# Patient Record
Sex: Male | Born: 1950
Health system: Southern US, Community
[De-identification: ages and names within clinical notes are randomized; demographics above are authoritative.]

## PROBLEM LIST (undated history)

## (undated) DIAGNOSIS — U071 COVID-19: Secondary | ICD-10-CM

## (undated) DIAGNOSIS — I10 Essential (primary) hypertension: Secondary | ICD-10-CM

## (undated) DIAGNOSIS — B019 Varicella without complication: Secondary | ICD-10-CM

## (undated) DIAGNOSIS — G4730 Sleep apnea, unspecified: Secondary | ICD-10-CM

## (undated) DIAGNOSIS — J449 Chronic obstructive pulmonary disease, unspecified: Secondary | ICD-10-CM

## (undated) DIAGNOSIS — K635 Polyp of colon: Secondary | ICD-10-CM

## (undated) DIAGNOSIS — E785 Hyperlipidemia, unspecified: Secondary | ICD-10-CM

## (undated) DIAGNOSIS — T7840XA Allergy, unspecified, initial encounter: Secondary | ICD-10-CM

## (undated) DIAGNOSIS — F4310 Post-traumatic stress disorder, unspecified: Secondary | ICD-10-CM

## (undated) DIAGNOSIS — G629 Polyneuropathy, unspecified: Secondary | ICD-10-CM

## (undated) DIAGNOSIS — E119 Type 2 diabetes mellitus without complications: Secondary | ICD-10-CM

## (undated) HISTORY — PX: TONSILLECTOMY: SUR1361

## (undated) HISTORY — DX: COVID-19: U07.1

## (undated) HISTORY — DX: Polyp of colon: K63.5

## (undated) HISTORY — DX: Varicella without complication: B01.9

## (undated) HISTORY — DX: Allergy, unspecified, initial encounter: T78.40XA

## (undated) HISTORY — DX: Hyperlipidemia, unspecified: E78.5

## (undated) HISTORY — PX: LASIK: SHX215

## (undated) HISTORY — DX: Essential (primary) hypertension: I10

## (undated) HISTORY — DX: Type 2 diabetes mellitus without complications: E11.9

---

## 2015-11-23 LAB — HM COLONOSCOPY

## 2016-04-19 DIAGNOSIS — M5137 Other intervertebral disc degeneration, lumbosacral region: Secondary | ICD-10-CM | POA: Diagnosis not present

## 2016-04-19 DIAGNOSIS — M62838 Other muscle spasm: Secondary | ICD-10-CM | POA: Diagnosis not present

## 2016-04-19 DIAGNOSIS — M542 Cervicalgia: Secondary | ICD-10-CM | POA: Diagnosis not present

## 2016-04-19 DIAGNOSIS — M5030 Other cervical disc degeneration, unspecified cervical region: Secondary | ICD-10-CM | POA: Diagnosis not present

## 2016-04-19 DIAGNOSIS — M5136 Other intervertebral disc degeneration, lumbar region: Secondary | ICD-10-CM | POA: Diagnosis not present

## 2016-04-19 DIAGNOSIS — M9951 Intervertebral disc stenosis of neural canal of cervical region: Secondary | ICD-10-CM | POA: Diagnosis not present

## 2016-04-19 DIAGNOSIS — M47812 Spondylosis without myelopathy or radiculopathy, cervical region: Secondary | ICD-10-CM | POA: Diagnosis not present

## 2016-05-31 DIAGNOSIS — I10 Essential (primary) hypertension: Secondary | ICD-10-CM | POA: Diagnosis not present

## 2016-05-31 DIAGNOSIS — J3089 Other allergic rhinitis: Secondary | ICD-10-CM | POA: Diagnosis not present

## 2016-05-31 DIAGNOSIS — M720 Palmar fascial fibromatosis [Dupuytren]: Secondary | ICD-10-CM | POA: Diagnosis not present

## 2016-05-31 DIAGNOSIS — E1169 Type 2 diabetes mellitus with other specified complication: Secondary | ICD-10-CM | POA: Diagnosis not present

## 2016-05-31 DIAGNOSIS — F5104 Psychophysiologic insomnia: Secondary | ICD-10-CM | POA: Diagnosis not present

## 2016-05-31 DIAGNOSIS — Z6826 Body mass index (BMI) 26.0-26.9, adult: Secondary | ICD-10-CM | POA: Diagnosis not present

## 2016-05-31 DIAGNOSIS — E785 Hyperlipidemia, unspecified: Secondary | ICD-10-CM | POA: Diagnosis not present

## 2016-05-31 DIAGNOSIS — Z86010 Personal history of colonic polyps: Secondary | ICD-10-CM | POA: Diagnosis not present

## 2016-05-31 DIAGNOSIS — E559 Vitamin D deficiency, unspecified: Secondary | ICD-10-CM | POA: Diagnosis not present

## 2016-06-09 DIAGNOSIS — M720 Palmar fascial fibromatosis [Dupuytren]: Secondary | ICD-10-CM | POA: Diagnosis not present

## 2016-06-14 DIAGNOSIS — M5137 Other intervertebral disc degeneration, lumbosacral region: Secondary | ICD-10-CM | POA: Diagnosis not present

## 2016-06-14 DIAGNOSIS — M5136 Other intervertebral disc degeneration, lumbar region: Secondary | ICD-10-CM | POA: Diagnosis not present

## 2016-06-14 DIAGNOSIS — M62838 Other muscle spasm: Secondary | ICD-10-CM | POA: Diagnosis not present

## 2016-06-14 DIAGNOSIS — M542 Cervicalgia: Secondary | ICD-10-CM | POA: Diagnosis not present

## 2016-06-14 DIAGNOSIS — M47812 Spondylosis without myelopathy or radiculopathy, cervical region: Secondary | ICD-10-CM | POA: Diagnosis not present

## 2016-06-14 DIAGNOSIS — M791 Myalgia: Secondary | ICD-10-CM | POA: Diagnosis not present

## 2016-06-14 DIAGNOSIS — M9951 Intervertebral disc stenosis of neural canal of cervical region: Secondary | ICD-10-CM | POA: Diagnosis not present

## 2016-06-14 DIAGNOSIS — M5030 Other cervical disc degeneration, unspecified cervical region: Secondary | ICD-10-CM | POA: Diagnosis not present

## 2016-08-08 DIAGNOSIS — M720 Palmar fascial fibromatosis [Dupuytren]: Secondary | ICD-10-CM | POA: Diagnosis not present

## 2016-08-09 DIAGNOSIS — H2513 Age-related nuclear cataract, bilateral: Secondary | ICD-10-CM | POA: Diagnosis not present

## 2016-08-09 DIAGNOSIS — E119 Type 2 diabetes mellitus without complications: Secondary | ICD-10-CM | POA: Diagnosis not present

## 2016-08-19 DIAGNOSIS — E1169 Type 2 diabetes mellitus with other specified complication: Secondary | ICD-10-CM | POA: Diagnosis not present

## 2016-08-19 DIAGNOSIS — L03032 Cellulitis of left toe: Secondary | ICD-10-CM | POA: Diagnosis not present

## 2016-08-19 DIAGNOSIS — B351 Tinea unguium: Secondary | ICD-10-CM | POA: Diagnosis not present

## 2016-08-29 DIAGNOSIS — S90221A Contusion of right lesser toe(s) with damage to nail, initial encounter: Secondary | ICD-10-CM | POA: Diagnosis not present

## 2016-08-29 DIAGNOSIS — L03032 Cellulitis of left toe: Secondary | ICD-10-CM | POA: Diagnosis not present

## 2016-09-20 DIAGNOSIS — M791 Myalgia: Secondary | ICD-10-CM | POA: Diagnosis not present

## 2016-09-20 DIAGNOSIS — M47812 Spondylosis without myelopathy or radiculopathy, cervical region: Secondary | ICD-10-CM | POA: Diagnosis not present

## 2016-09-20 DIAGNOSIS — M542 Cervicalgia: Secondary | ICD-10-CM | POA: Diagnosis not present

## 2016-09-20 DIAGNOSIS — M5137 Other intervertebral disc degeneration, lumbosacral region: Secondary | ICD-10-CM | POA: Diagnosis not present

## 2016-09-20 DIAGNOSIS — M62838 Other muscle spasm: Secondary | ICD-10-CM | POA: Diagnosis not present

## 2016-09-20 DIAGNOSIS — M5030 Other cervical disc degeneration, unspecified cervical region: Secondary | ICD-10-CM | POA: Diagnosis not present

## 2016-09-20 DIAGNOSIS — M5136 Other intervertebral disc degeneration, lumbar region: Secondary | ICD-10-CM | POA: Diagnosis not present

## 2016-09-20 DIAGNOSIS — M9951 Intervertebral disc stenosis of neural canal of cervical region: Secondary | ICD-10-CM | POA: Diagnosis not present

## 2016-10-12 DIAGNOSIS — I10 Essential (primary) hypertension: Secondary | ICD-10-CM | POA: Diagnosis not present

## 2016-10-12 DIAGNOSIS — Z125 Encounter for screening for malignant neoplasm of prostate: Secondary | ICD-10-CM | POA: Diagnosis not present

## 2016-10-12 DIAGNOSIS — M720 Palmar fascial fibromatosis [Dupuytren]: Secondary | ICD-10-CM | POA: Diagnosis not present

## 2016-10-12 DIAGNOSIS — Z86010 Personal history of colonic polyps: Secondary | ICD-10-CM | POA: Diagnosis not present

## 2016-10-12 DIAGNOSIS — E1169 Type 2 diabetes mellitus with other specified complication: Secondary | ICD-10-CM | POA: Diagnosis not present

## 2016-10-12 DIAGNOSIS — F5104 Psychophysiologic insomnia: Secondary | ICD-10-CM | POA: Diagnosis not present

## 2016-10-12 DIAGNOSIS — E559 Vitamin D deficiency, unspecified: Secondary | ICD-10-CM | POA: Diagnosis not present

## 2016-10-12 DIAGNOSIS — J3089 Other allergic rhinitis: Secondary | ICD-10-CM | POA: Diagnosis not present

## 2016-10-12 DIAGNOSIS — Z6828 Body mass index (BMI) 28.0-28.9, adult: Secondary | ICD-10-CM | POA: Diagnosis not present

## 2016-10-12 DIAGNOSIS — E785 Hyperlipidemia, unspecified: Secondary | ICD-10-CM | POA: Diagnosis not present

## 2016-10-20 DIAGNOSIS — I10 Essential (primary) hypertension: Secondary | ICD-10-CM | POA: Diagnosis not present

## 2016-10-20 DIAGNOSIS — E785 Hyperlipidemia, unspecified: Secondary | ICD-10-CM | POA: Diagnosis not present

## 2016-10-20 DIAGNOSIS — E119 Type 2 diabetes mellitus without complications: Secondary | ICD-10-CM | POA: Diagnosis not present

## 2016-10-20 DIAGNOSIS — D619 Aplastic anemia, unspecified: Secondary | ICD-10-CM | POA: Diagnosis not present

## 2016-10-28 DIAGNOSIS — M5408 Panniculitis affecting regions of neck and back, sacral and sacrococcygeal region: Secondary | ICD-10-CM | POA: Diagnosis not present

## 2016-10-28 DIAGNOSIS — M47816 Spondylosis without myelopathy or radiculopathy, lumbar region: Secondary | ICD-10-CM | POA: Diagnosis not present

## 2016-10-28 DIAGNOSIS — M47817 Spondylosis without myelopathy or radiculopathy, lumbosacral region: Secondary | ICD-10-CM | POA: Diagnosis not present

## 2016-10-28 DIAGNOSIS — M545 Low back pain: Secondary | ICD-10-CM | POA: Diagnosis not present

## 2016-12-20 DIAGNOSIS — M62838 Other muscle spasm: Secondary | ICD-10-CM | POA: Diagnosis not present

## 2016-12-20 DIAGNOSIS — M5137 Other intervertebral disc degeneration, lumbosacral region: Secondary | ICD-10-CM | POA: Diagnosis not present

## 2016-12-20 DIAGNOSIS — M47812 Spondylosis without myelopathy or radiculopathy, cervical region: Secondary | ICD-10-CM | POA: Diagnosis not present

## 2016-12-20 DIAGNOSIS — M5030 Other cervical disc degeneration, unspecified cervical region: Secondary | ICD-10-CM | POA: Diagnosis not present

## 2016-12-20 DIAGNOSIS — M545 Low back pain: Secondary | ICD-10-CM | POA: Diagnosis not present

## 2016-12-20 DIAGNOSIS — M542 Cervicalgia: Secondary | ICD-10-CM | POA: Diagnosis not present

## 2016-12-20 DIAGNOSIS — M9951 Intervertebral disc stenosis of neural canal of cervical region: Secondary | ICD-10-CM | POA: Diagnosis not present

## 2016-12-20 DIAGNOSIS — Z79891 Long term (current) use of opiate analgesic: Secondary | ICD-10-CM | POA: Diagnosis not present

## 2017-02-13 DIAGNOSIS — M720 Palmar fascial fibromatosis [Dupuytren]: Secondary | ICD-10-CM | POA: Diagnosis not present

## 2017-02-13 DIAGNOSIS — I10 Essential (primary) hypertension: Secondary | ICD-10-CM | POA: Diagnosis not present

## 2017-02-13 DIAGNOSIS — E785 Hyperlipidemia, unspecified: Secondary | ICD-10-CM | POA: Diagnosis not present

## 2017-02-13 DIAGNOSIS — F5104 Psychophysiologic insomnia: Secondary | ICD-10-CM | POA: Diagnosis not present

## 2017-02-13 DIAGNOSIS — Z6828 Body mass index (BMI) 28.0-28.9, adult: Secondary | ICD-10-CM | POA: Diagnosis not present

## 2017-02-13 DIAGNOSIS — E559 Vitamin D deficiency, unspecified: Secondary | ICD-10-CM | POA: Diagnosis not present

## 2017-02-13 DIAGNOSIS — L233 Allergic contact dermatitis due to drugs in contact with skin: Secondary | ICD-10-CM | POA: Diagnosis not present

## 2017-02-13 DIAGNOSIS — Z86010 Personal history of colonic polyps: Secondary | ICD-10-CM | POA: Diagnosis not present

## 2017-02-13 DIAGNOSIS — J3089 Other allergic rhinitis: Secondary | ICD-10-CM | POA: Diagnosis not present

## 2017-02-13 DIAGNOSIS — E1169 Type 2 diabetes mellitus with other specified complication: Secondary | ICD-10-CM | POA: Diagnosis not present

## 2017-02-17 DIAGNOSIS — M47816 Spondylosis without myelopathy or radiculopathy, lumbar region: Secondary | ICD-10-CM | POA: Diagnosis not present

## 2017-02-17 DIAGNOSIS — M5408 Panniculitis affecting regions of neck and back, sacral and sacrococcygeal region: Secondary | ICD-10-CM | POA: Diagnosis not present

## 2017-02-17 DIAGNOSIS — M545 Low back pain: Secondary | ICD-10-CM | POA: Diagnosis not present

## 2017-02-17 DIAGNOSIS — M47817 Spondylosis without myelopathy or radiculopathy, lumbosacral region: Secondary | ICD-10-CM | POA: Diagnosis not present

## 2017-04-03 DIAGNOSIS — M5137 Other intervertebral disc degeneration, lumbosacral region: Secondary | ICD-10-CM | POA: Diagnosis not present

## 2017-04-03 DIAGNOSIS — M9951 Intervertebral disc stenosis of neural canal of cervical region: Secondary | ICD-10-CM | POA: Diagnosis not present

## 2017-04-03 DIAGNOSIS — M47812 Spondylosis without myelopathy or radiculopathy, cervical region: Secondary | ICD-10-CM | POA: Diagnosis not present

## 2017-04-03 DIAGNOSIS — M7918 Myalgia, other site: Secondary | ICD-10-CM | POA: Diagnosis not present

## 2017-04-03 DIAGNOSIS — M5136 Other intervertebral disc degeneration, lumbar region: Secondary | ICD-10-CM | POA: Diagnosis not present

## 2017-04-03 DIAGNOSIS — M542 Cervicalgia: Secondary | ICD-10-CM | POA: Diagnosis not present

## 2017-04-03 DIAGNOSIS — M5030 Other cervical disc degeneration, unspecified cervical region: Secondary | ICD-10-CM | POA: Diagnosis not present

## 2017-04-03 DIAGNOSIS — M62838 Other muscle spasm: Secondary | ICD-10-CM | POA: Diagnosis not present

## 2017-04-14 ENCOUNTER — Ambulatory Visit (INDEPENDENT_AMBULATORY_CARE_PROVIDER_SITE_OTHER): Payer: Medicare Other | Admitting: Internal Medicine

## 2017-04-14 VITALS — BP 134/70 | HR 111 | Temp 97.9°F | Ht 69.0 in | Wt 184.4 lb

## 2017-04-14 DIAGNOSIS — D229 Melanocytic nevi, unspecified: Secondary | ICD-10-CM

## 2017-04-14 DIAGNOSIS — E559 Vitamin D deficiency, unspecified: Secondary | ICD-10-CM | POA: Diagnosis not present

## 2017-04-14 DIAGNOSIS — M720 Palmar fascial fibromatosis [Dupuytren]: Secondary | ICD-10-CM

## 2017-04-14 DIAGNOSIS — E785 Hyperlipidemia, unspecified: Secondary | ICD-10-CM | POA: Diagnosis not present

## 2017-04-14 DIAGNOSIS — K635 Polyp of colon: Secondary | ICD-10-CM

## 2017-04-14 DIAGNOSIS — G8929 Other chronic pain: Secondary | ICD-10-CM

## 2017-04-14 DIAGNOSIS — J302 Other seasonal allergic rhinitis: Secondary | ICD-10-CM

## 2017-04-14 DIAGNOSIS — M549 Dorsalgia, unspecified: Secondary | ICD-10-CM | POA: Diagnosis not present

## 2017-04-14 DIAGNOSIS — E119 Type 2 diabetes mellitus without complications: Secondary | ICD-10-CM | POA: Diagnosis not present

## 2017-04-14 DIAGNOSIS — I10 Essential (primary) hypertension: Secondary | ICD-10-CM | POA: Diagnosis not present

## 2017-04-14 MED ORDER — METFORMIN HCL 1000 MG PO TABS: 1000.0000 mg | ORAL_TABLET | Freq: Two times a day (BID) | ORAL | 1 refills | Status: DC

## 2017-04-14 MED ORDER — AMLODIPINE BESYLATE 2.5 MG PO TABS: 2.5000 mg | ORAL_TABLET | Freq: Every day | ORAL | 1 refills | Status: DC

## 2017-04-14 MED ORDER — JANUVIA 100 MG PO TABS: 100.0000 mg | ORAL_TABLET | Freq: Every day | ORAL | 1 refills | Status: DC

## 2017-04-14 MED ORDER — ATORVASTATIN CALCIUM 20 MG PO TABS: 20.0000 mg | ORAL_TABLET | Freq: Every day | ORAL | 1 refills | Status: DC

## 2017-04-14 NOTE — Patient Instructions (Addendum)
I will refer to dermatology Dr. Kellie Moor on [facility]  [place]  [phone]  We will see you back 06/2017 with me      Mole A mole is a colored (pigmented) growth on the skin. Moles are very common. They are usually harmless, but some moles can become cancerous over time. What are the causes? Moles occur when pigmented skin cells grow together in clusters instead of spreading out in the skin as they normally do. The reason why the skin cells grow together in clusters is not known. What are the signs or symptoms? A mole may be:  [place] or black.  Flat or raised.  Smooth or wrinkled.  How is this diagnosed? A mole is diagnosed with a skin exam. If your health care provider thinks a mole may be cancerous, a piece of the mole will be removed for testing. How is this treated? Treatment is not needed unless a mole is cancerous. If a mole is cancerous, it will be removed. If a mole is causing pain or you do not like the way it looks, you may choose to have it removed. Follow these instructions at home:  Every month, look for new moles and check your existing moles for changes. This is important because a change in a mole can mean that the mole has become cancerous. Look for changes in: ? Size. Look for moles that are more than  in (0.64 cm) wide (in diameter). ? Shape. Look for moles that are not round or oval. ? Borders. Look for moles that are not symmetrical. ? Color. Note that it is normal for moles to get darker during pregnancy or when you take birth control pills.  When you are outdoors, wear sunscreen with SPF 30 (sun protection factor 30) or higher. Reapply the sunscreen every 2-3 hours.  If you have a large number of moles, see a skin doctor (dermatologist) at least one time every year. Contact a health care provider if:  The size, shape, borders, or color of your mole change.  Your mole, or the skin near the mole, becomes painful, sore, red, or swollen.  Your  mole: ? Develops more than one color. ? Itches or bleeds. ? Becomes scaly, sheds skin, or oozes fluid. ? Becomes flat or develops raised areas. ? Becomes hard or soft.  You develop a new mole. This information is not intended to replace advice given to you by your health care provider. Make sure you discuss any questions you have with your health care provider. Document Released: 10/19/2000 Document Revised: 07/08/2015 Document Reviewed: 11/14/2014 Elsevier Interactive Patient Education  [employer].

## 2017-04-14 NOTE — Progress Notes (Signed)
Pre visit review using our clinic review tool, if applicable. No additional management support is needed unless otherwise documented below in the visit note. 

## 2017-04-17 ENCOUNTER — Encounter: Payer: Self-pay | Admitting: Internal Medicine

## 2017-04-17 NOTE — Progress Notes (Signed)
Chief Complaint  Patient presents with  . Establish Care   New patient just moved from [facility]. He had to put his dog of 4 years to sleep today  Reviewed records he brought from [facility] today chart update accordingly  HTN controlled on Norvasc 2.5 mg qd  DM 2 controlled on Januvia 100 and Metformin 1000 mg bid  He requests Rx refills all meds       Review of Systems  Constitutional: Negative for weight loss.  HENT: Negative for hearing loss.   Eyes: Negative for blurred vision.  Respiratory: Negative for shortness of breath.   Cardiovascular: Negative for chest pain.  Gastrointestinal: Negative for abdominal pain.  Musculoskeletal: Positive for back pain.  Skin: Negative for rash.  Neurological: Negative for headaches.  Psychiatric/Behavioral: The patient has insomnia.    Past Medical History:  Diagnosis Date  . Allergy   . Chicken pox   . Colon polyps   . Diabetes mellitus without complication ([place])   . Hyperlipidemia   . Hypertension    History reviewed. No pertinent surgical history. Family History  Problem Relation Age of Onset  . Cancer Mother   . Diabetes Mother   . Hypertension Mother    Social History   Socioeconomic History  . Marital status: Married    Spouse name: Not on file  . Number of children: Not on file  . Years of education: Not on file  . Highest education level: Not on file  Social Needs  . Financial resource strain: Not on file  . Food insecurity - worry: Not on file  . Food insecurity - inability: Not on file  . Transportation needs - medical: Not on file  . Transportation needs - non-medical: Not on file  Occupational History  . Not on file  Tobacco Use  . Smoking status: Former Research scientist (life sciences)  . Smokeless tobacco: Never Used  Substance and Sexual Activity  . Alcohol use: No    Frequency: Never  . Drug use: No  . Sexual activity: Yes    Partners: Female  Other Topics Concern  . Not on file  Social History Narrative   Wears selt belt    Safe in relationship    Recently moved from [facility] 2018/19'   Retired Occupational hygienist degree    Current Meds  Medication Sig  . amLODipine (NORVASC) 2.5 MG tablet Take 1 tablet (2.5 mg total) by mouth daily.  . Ascorbic Acid (VITAMIN C PO) Take by mouth.  Marland Kitchen aspirin EC 81 MG tablet Take 81 mg by mouth daily.  Marland Kitchen atorvastatin (LIPITOR) 20 MG tablet Take 1 tablet (20 mg total) by mouth daily at 6 PM.  . Cholecalciferol (VITAMIN D-3) 1000 units CAPS Take by mouth.  . Cinnamon 500 MG TABS Take by mouth.  Marland Kitchen JANUVIA 100 MG tablet Take 1 tablet (100 mg total) by mouth daily.  . metFORMIN (GLUCOPHAGE) 1000 MG tablet Take 1 tablet (1,000 mg total) by mouth 2 (two) times daily with a meal.  . pyridOXINE (VITAMIN B-6) 50 MG tablet Take 50 mg by mouth daily.  Marland Kitchen tiZANidine (ZANAFLEX) 4 MG tablet TK 1 T PO BID PRN  . vitamin B-12 (CYANOCOBALAMIN) 1000 MCG tablet Take 1,000 mcg by mouth daily.  Marland Kitchen VITAMIN E PO Take by mouth.  . [DISCONTINUED] amLODipine (NORVASC) 2.5 MG tablet   . [DISCONTINUED] atorvastatin (LIPITOR) 20 MG tablet   . [DISCONTINUED] JANUVIA 100 MG tablet   . [DISCONTINUED] metFORMIN (GLUCOPHAGE) 1000 MG  tablet   . [DISCONTINUED] promethazine-codeine (PHENERGAN WITH CODEINE) 6.25-10 MG/5ML syrup Take by mouth every 6 (six) hours as needed for cough.   Allergies  Allergen Reactions  . Biofreeze [Menthol (Topical Analgesic)]     Rash    No results found for this or any previous visit (from the past 2160 hour(s)). Objective  Body mass index is 27.23 kg/m. Wt Readings from Last 3 Encounters:  04/14/17 184 lb 6.4 oz (83.6 kg)   Temp Readings from Last 3 Encounters:  04/14/17 97.9 F (36.6 C) (Oral)   BP Readings from Last 3 Encounters:  04/14/17 134/70   Pulse Readings from Last 3 Encounters:  04/14/17 (!) 111   O2 sat room air 98%   Physical Exam  Constitutional: He is oriented to person, place, and time and well-developed, well-nourished, and in no  distress. Vital signs are normal.  HENT:  Head: Normocephalic and atraumatic.  Mouth/Throat: Oropharynx is clear and moist and mucous membranes are normal.  Eyes: Conjunctivae are normal. Pupils are equal, round, and reactive to light.  Cardiovascular: Normal rate, regular rhythm and normal heart sounds.  Pulmonary/Chest: Effort normal and breath sounds normal.  Abdominal: Soft. Bowel sounds are normal. There is no tenderness.  Neurological: He is alert and oriented to person, place, and time. Gait normal. Gait normal.  Skin: Skin is warm and dry.  Multiple nevi   Psychiatric: Mood, memory, affect and judgment normal.  Nursing note and vitals reviewed.   Assessment   1. DM 2 since 2013 A1C 06/04/16 6.6 10/12/16 A1C 6.5 then A1C 7.0 02/13/17  2. HTN/HLD 3. Chronic back pain  4. HM  Plan   1.  Cont metformin 1000 mg bid and januvia 100 mg qd   Reviewed labs 06/04/16 H/H 12.4/35.9 then 12.4/37.0 10/12/16 then 12.2/36.7 02/13/17, UA had 06/04/16 normal, 06/04/16 lipid TC 101, TG 60, HDL 36, LDL 53.0 06/04/16 then 10/12/16 TC 106, TG 118, HDL 38, LDL 44.4 then 02/13/17 TC 105, TG 107, HDL 33, LDL 50.6, 4/48/18 BUN/Cr 32/1.12 GFR 70; 10/12/16 BUN/Cr. 23/1.09 GFR 72 then 17/1.13 GFR 69 02/13/17, TSH 06/04/16 1.33 normal and TSH 2.21 02/13/17, vit D 36 05/31/16  Urine protein had 06/04/16 normal; had urine protein again 10/12/16 normal due and normal again 02/13/17 due 02/13/2018 Last eye exam 07/2016, foot exam 10/2016 per pt   2.  02/2017 losartan 25 mg was stopped per old PCP note and changed to norvasc 2.5 due to recall  BP controlled today  Cont lipitor 20 mg qhs  3. Pending pain clinic referral in [place]  4.  Flu shot 01/2017  Tdap need to do at f/u  pna shot last had 2013 unclear if had pna 23 vs prevnar  He did have zostervax 2013 but never shingrix vaccines  colonoscopy had 11/23/15 moderate to severe diverticulosis 2 polyps no pathology report noted Dr. Rexene Alberts [facility] requested records for  pathology report  PSA had 10/12/16 2.21 prior to that 06/12/15 2.67  Referred to dermatology today Dr. Kellie Moor tbse multiple nevi   Need to get H/O records from [facility]  Of note reviewed labs h/o +ANA titer 1:40 homogeous other w/u negative and gastric parietal cell ab 103.5 (upper limit nl 25) with normal B12   "I spent 30-35 minutes face-to face with patient with greater than 50% of time spent counseling and/or in coordination of care, reviewing old medical records and coordinating care I.e Rx refills, referrals, and grief counseling due to loss of  dog today   Provider: Dr. Olivia Mackie McLean-Scocuzza-Internal Medicine

## 2017-04-18 ENCOUNTER — Encounter: Payer: Self-pay | Admitting: Internal Medicine

## 2017-05-08 DIAGNOSIS — Z79899 Other long term (current) drug therapy: Secondary | ICD-10-CM | POA: Diagnosis not present

## 2017-05-08 DIAGNOSIS — Z5181 Encounter for therapeutic drug level monitoring: Secondary | ICD-10-CM | POA: Diagnosis not present

## 2017-05-08 DIAGNOSIS — G8929 Other chronic pain: Secondary | ICD-10-CM | POA: Diagnosis not present

## 2017-05-08 DIAGNOSIS — M545 Low back pain: Secondary | ICD-10-CM | POA: Diagnosis not present

## 2017-06-14 ENCOUNTER — Ambulatory Visit (INDEPENDENT_AMBULATORY_CARE_PROVIDER_SITE_OTHER): Payer: Medicare Other | Admitting: Internal Medicine

## 2017-06-14 ENCOUNTER — Encounter: Payer: Self-pay | Admitting: Internal Medicine

## 2017-06-14 VITALS — BP 126/70 | HR 90 | Temp 97.7°F | Ht 69.0 in | Wt 184.8 lb

## 2017-06-14 DIAGNOSIS — Z0184 Encounter for antibody response examination: Secondary | ICD-10-CM

## 2017-06-14 DIAGNOSIS — D649 Anemia, unspecified: Secondary | ICD-10-CM | POA: Diagnosis not present

## 2017-06-14 DIAGNOSIS — E119 Type 2 diabetes mellitus without complications: Secondary | ICD-10-CM

## 2017-06-14 DIAGNOSIS — E785 Hyperlipidemia, unspecified: Secondary | ICD-10-CM

## 2017-06-14 DIAGNOSIS — I10 Essential (primary) hypertension: Secondary | ICD-10-CM

## 2017-06-14 DIAGNOSIS — J302 Other seasonal allergic rhinitis: Secondary | ICD-10-CM

## 2017-06-14 LAB — LIPID PANEL
Cholesterol: 108 mg/dL (ref 0–200)
HDL: 35.3 mg/dL — ABNORMAL LOW
LDL Cholesterol: 45 mg/dL (ref 0–99)
NonHDL: 72.84
Total CHOL/HDL Ratio: 3
Triglycerides: 138 mg/dL (ref 0.0–149.0)
VLDL: 27.6 mg/dL (ref 0.0–40.0)

## 2017-06-14 LAB — CBC WITH DIFFERENTIAL/PLATELET
BASOS PCT: 0.4 % (ref 0.0–3.0)
Basophils Absolute: 0 10*3/uL (ref 0.0–0.1)
EOS PCT: 0.8 % (ref 0.0–5.0)
Eosinophils Absolute: 0.1 10*3/uL (ref 0.0–0.7)
HCT: 39.3 % (ref 39.0–52.0)
HEMOGLOBIN: 13.1 g/dL (ref 13.0–17.0)
LYMPHS ABS: 2 10*3/uL (ref 0.7–4.0)
Lymphocytes Relative: 27.7 % (ref 12.0–46.0)
MCHC: 33.4 g/dL (ref 30.0–36.0)
MCV: 89.4 fl (ref 78.0–100.0)
MONOS PCT: 8.6 % (ref 3.0–12.0)
Monocytes Absolute: 0.6 10*3/uL (ref 0.1–1.0)
Neutro Abs: 4.5 10*3/uL (ref 1.4–7.7)
Neutrophils Relative %: 62.5 % (ref 43.0–77.0)
Platelets: 263 10*3/uL (ref 150.0–400.0)
RBC: 4.4 Mil/uL (ref 4.22–5.81)
RDW: 12.9 % (ref 11.5–15.5)
WBC: 7.3 10*3/uL (ref 4.0–10.5)

## 2017-06-14 LAB — COMPREHENSIVE METABOLIC PANEL WITH GFR
ALT: 27 U/L (ref 0–53)
AST: 23 U/L (ref 0–37)
Albumin: 4.3 g/dL (ref 3.5–5.2)
Alkaline Phosphatase: 60 U/L (ref 39–117)
BUN: 26 mg/dL — ABNORMAL HIGH (ref 6–23)
CO2: 22 meq/L (ref 19–32)
Calcium: 9.4 mg/dL (ref 8.4–10.5)
Chloride: 104 meq/L (ref 96–112)
Creatinine, Ser: 1.05 mg/dL (ref 0.40–1.50)
GFR: 75.07 mL/min
Glucose, Bld: 107 mg/dL — ABNORMAL HIGH (ref 70–99)
Potassium: 4.3 meq/L (ref 3.5–5.1)
Sodium: 137 meq/L (ref 135–145)
Total Bilirubin: 1.2 mg/dL (ref 0.2–1.2)
Total Protein: 7.2 g/dL (ref 6.0–8.3)

## 2017-06-14 LAB — HEMOGLOBIN A1C: Hgb A1c MFr Bld: 7.6 % — ABNORMAL HIGH (ref 4.6–6.5)

## 2017-06-14 NOTE — Progress Notes (Signed)
Chief Complaint  Patient presents with  . Follow-up   F/u  1. HTN sl elevated today on norvasc 2.5 mg qd  2. DM 2 cbg 121 this am on metformin 1000 and Januvia 100  3. He reports 3 weeks ago had sore throat h/a saw provider in [facility] given Mucinex and motrin  4. Allergies on zyrtec 10 mg qd   Review of Systems  Constitutional: Negative for weight loss.  HENT: Negative for sore throat.   Eyes: Negative for blurred vision.  Respiratory: Negative for shortness of breath.   Cardiovascular: Negative for chest pain.  Musculoskeletal: Negative for back pain.  Skin: Negative for rash.  Neurological: Negative for headaches.  Endo/Heme/Allergies: Positive for environmental allergies.  Psychiatric/Behavioral: Negative for depression.   Past Medical History:  Diagnosis Date  . Allergy   . Chicken pox   . Colon polyps   . Diabetes mellitus without complication ([place])   . Hyperlipidemia   . Hypertension    Past Surgical History:  Procedure Laterality Date  . TONSILLECTOMY     Family History  Problem Relation Age of Onset  . Cancer Mother        bone, liver, lung former smoker ? lung died 2008/04/27  . Diabetes Mother   . Hypertension Mother    Social History   Socioeconomic History  . Marital status: Married    Spouse name: Not on file  . Number of children: Not on file  . Years of education: Not on file  . Highest education level: Not on file  Occupational History  . Not on file  Social Needs  . Financial resource strain: Not on file  . Food insecurity:    Worry: Not on file    Inability: Not on file  . Transportation needs:    Medical: Not on file    Non-medical: Not on file  Tobacco Use  . Smoking status: Former Research scientist (life sciences)  . Smokeless tobacco: Never Used  Substance and Sexual Activity  . Alcohol use: No    Frequency: Never  . Drug use: No  . Sexual activity: Yes    Partners: Female  Lifestyle  . Physical activity:    Days per week: Not on file    Minutes per session:  Not on file  . Stress: Not on file  Relationships  . Social connections:    Talks on phone: Not on file    Gets together: Not on file    Attends religious service: Not on file    Active member of club or organization: Not on file    Attends meetings of clubs or organizations: Not on file    Relationship status: Not on file  . Intimate partner violence:    Fear of current or ex partner: Not on file    Emotionally abused: Not on file    Physically abused: Not on file    Forced sexual activity: Not on file  Other Topics Concern  . Not on file  Social History Narrative   Wears selt belt    Safe in relationship    Recently moved from [facility] 2018/19 originally from [facility] dad and sister 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    Retired Occupational hygienist degree    Current Meds  Medication Sig  . amLODipine (NORVASC) 2.5 MG tablet Take 1 tablet (2.5 mg total) by mouth daily.  . Ascorbic Acid (VITAMIN C PO) Take by mouth.  Marland Kitchen aspirin EC 81 MG tablet Take 81 mg by  mouth daily.  Marland Kitchen atorvastatin (LIPITOR) 20 MG tablet Take 1 tablet (20 mg total) by mouth daily at 6 PM.  . cetirizine (ZYRTEC) 10 MG tablet Take 10 mg by mouth daily.  . Cholecalciferol (VITAMIN D-3) 1000 units CAPS Take by mouth.  . Cinnamon 500 MG TABS Take by mouth.  Marland Kitchen ibuprofen (ADVIL,MOTRIN) 600 MG tablet Take 600 mg by mouth every 6 (six) hours as needed.  Marland Kitchen JANUVIA 100 MG tablet Take 1 tablet (100 mg total) by mouth daily.  . metFORMIN (GLUCOPHAGE) 1000 MG tablet Take 1 tablet (1,000 mg total) by mouth 2 (two) times daily with a meal.  . pyridOXINE (VITAMIN B-6) 50 MG tablet Take 50 mg by mouth daily.  Marland Kitchen tiZANidine (ZANAFLEX) 4 MG tablet TK 1 T PO QHS PRN  . vitamin B-12 (CYANOCOBALAMIN) 1000 MCG tablet Take 1,000 mcg by mouth daily.  Marland Kitchen VITAMIN E PO Take by mouth.   Allergies  Allergen Reactions  . Biofreeze [Menthol (Topical Analgesic)]     Rash    No results found for this or any previous visit (from the past 2160  hour(s)). Objective  Body mass index is 27.29 kg/m. Wt Readings from Last 3 Encounters:  06/14/17 184 lb 12.8 oz (83.8 kg)  04/14/17 184 lb 6.4 oz (83.6 kg)   Temp Readings from Last 3 Encounters:  06/14/17 97.7 F (36.5 C) (Oral)  04/14/17 97.9 F (36.6 C) (Oral)   BP Readings from Last 3 Encounters:  06/14/17 (!) 146/76  04/14/17 134/70   Pulse Readings from Last 3 Encounters:  06/14/17 90  04/14/17 (!) 111    Physical Exam  Constitutional: He is oriented to person, place, and time. He appears well-developed and well-nourished. He is cooperative.  HENT:  Head: Normocephalic and atraumatic.  Mouth/Throat: Oropharynx is clear and moist and mucous membranes are normal.  Eyes: Pupils are equal, round, and reactive to light. Conjunctivae are normal.  Cardiovascular: Normal rate, regular rhythm and normal heart sounds.  Pulmonary/Chest: Effort normal and breath sounds normal.  Neurological: He is alert and oriented to person, place, and time. Gait normal.  Skin: Skin is warm, dry and intact.  Psychiatric: He has a normal mood and affect. His speech is normal and behavior is normal. Judgment and thought content normal. Cognition and memory are normal.  Nursing note and vitals reviewed.   Assessment   1. HTN sl elevated  2. DM 2 cbg 121 this am fasting  3. Sore throat resolved  4. Allergic rhinitis  5. HM Plan   1 Recheck BP 126/70 improved  Cont norvasc 2.5 mg qd  Labs today CMET, CBC, lipid, A1C  MMR 2. Cont meds  rec healthy diet choices  Referred  eye Dr. Nicholas Lose DM eye exam  3. Monitor  4. Cont zyrtec 10 mg qd  5.  Flu shot 01/2017  Tdap had 05/04/17 pna shot last had 2013 unclear if had pna 23 vs prevnar  He did have zostervax 2013 but never shingrix vaccines checked and base and none in stock  colonoscopy had 11/23/15 moderate to severe diverticulosis 2 polyps no pathology report noted Dr. Rexene Alberts [facility] requested  records for pathology report  -rec repeat in 5 years with h/o polyps  PSA had 10/12/16 2.21 prior to that 06/12/15 2.67  -due PSA 10/12/17  Declines hep B/C testing for now  Referred to dermatology today Dr. Kellie Moor tbse multiple nevi appt pending 08/09/17  See labs 04/14/17 visit   Chronic  back pain saw [facility] [place] did not like will not be going back hold on pain referral for now   Provider: Dr. Olivia Mackie McLean-Scocuzza-Internal Medicine

## 2017-06-14 NOTE — Patient Instructions (Signed)
Follow up in 4 months  Take care  We will let you know about your labs  Referred to [facility] for diabetic eye exam due 07/2017   Diabetes Mellitus and Nutrition When you have diabetes (diabetes mellitus), it is very important to have healthy eating habits because your blood sugar (glucose) levels are greatly affected by what you eat and drink. Eating healthy foods in the appropriate amounts, at about the same times every day, can help you:  Control your blood glucose.  Lower your risk of heart disease.  Improve your blood pressure.  Reach or maintain a healthy weight.  Every person with diabetes is different, and each person has different needs for a meal plan. Your health care provider may recommend that you work with a diet and nutrition specialist (dietitian) to make a meal plan that is best for you. Your meal plan may vary depending on factors such as:  The calories you need.  The medicines you take.  Your weight.  Your blood glucose, blood pressure, and cholesterol levels.  Your activity level.  Other health conditions you have, such as heart or kidney disease.  How do carbohydrates affect me? Carbohydrates affect your blood glucose level more than any other type of food. Eating carbohydrates naturally increases the amount of glucose in your blood. Carbohydrate counting is a method for keeping track of how many carbohydrates you eat. Counting carbohydrates is important to keep your blood glucose at a healthy level, especially if you use insulin or take certain oral diabetes medicines. It is important to know how many carbohydrates you can safely have in each meal. This is different for every person. Your dietitian can help you calculate how many carbohydrates you should have at each meal and for snack. Foods that contain carbohydrates include:  Bread, cereal, rice, pasta, and crackers.  Potatoes and corn.  Peas, beans, and lentils.  Milk and yogurt.  Fruit and  juice.  Desserts, such as cakes, cookies, ice cream, and candy.  How does alcohol affect me? Alcohol can cause a sudden decrease in blood glucose (hypoglycemia), especially if you use insulin or take certain oral diabetes medicines. Hypoglycemia can be a life-threatening condition. Symptoms of hypoglycemia (sleepiness, dizziness, and confusion) are similar to symptoms of having too much alcohol. If your health care provider says that alcohol is safe for you, follow these guidelines:  Limit alcohol intake to no more than 1 drink per day for nonpregnant women and 2 drinks per day for men. One drink equals 12 oz of beer, 5 oz of wine, or 1 oz of hard liquor.  Do not drink on an empty stomach.  Keep yourself hydrated with water, diet soda, or unsweetened iced tea.  Keep in mind that regular soda, juice, and other mixers may contain a lot of sugar and must be counted as carbohydrates.  What are tips for following this plan? Reading food labels  Start by checking the serving size on the label. The amount of calories, carbohydrates, fats, and other nutrients listed on the label are based on one serving of the food. Many foods contain more than one serving per package.  Check the total grams (g) of carbohydrates in one serving. You can calculate the number of servings of carbohydrates in one serving by dividing the total carbohydrates by 15. For example, if a food has 30 g of total carbohydrates, it would be equal to 2 servings of carbohydrates.  Check the number of grams (g) of saturated and  trans fats in one serving. Choose foods that have low or no amount of these fats.  Check the number of milligrams (mg) of sodium in one serving. Most people should limit total sodium intake to less than 2,300 mg per day.  Always check the nutrition information of foods labeled as "low-fat" or "nonfat". These foods may be higher in added sugar or refined carbohydrates and should be avoided.  Talk to your  dietitian to identify your daily goals for nutrients listed on the label. Shopping  Avoid buying canned, premade, or processed foods. These foods tend to be high in fat, sodium, and added sugar.  Shop around the outside edge of the grocery store. This includes fresh fruits and vegetables, bulk grains, fresh meats, and fresh dairy. Cooking  Use low-heat cooking methods, such as baking, instead of high-heat cooking methods like deep frying.  Cook using healthy oils, such as olive, canola, or sunflower oil.  Avoid cooking with butter, cream, or high-fat meats. Meal planning  Eat meals and snacks regularly, preferably at the same times every day. Avoid going long periods of time without eating.  Eat foods high in fiber, such as fresh fruits, vegetables, beans, and whole grains. Talk to your dietitian about how many servings of carbohydrates you can eat at each meal.  Eat 4-6 ounces of lean protein each day, such as lean meat, chicken, fish, eggs, or tofu. 1 ounce is equal to 1 ounce of meat, chicken, or fish, 1 egg, or 1/4 cup of tofu.  Eat some foods each day that contain healthy fats, such as avocado, nuts, seeds, and fish. Lifestyle   Check your blood glucose regularly.  Exercise at least 30 minutes 5 or more days each week, or as told by your health care provider.  Take medicines as told by your health care provider.  Do not use any products that contain nicotine or tobacco, such as cigarettes and e-cigarettes. If you need help quitting, ask your health care provider.  Work with a Social worker or diabetes educator to identify strategies to manage stress and any emotional and social challenges. What are some questions to ask my health care provider?  Do I need to meet with a diabetes educator?  Do I need to meet with a dietitian?  What number can I call if I have questions?  When are the best times to check my blood glucose? Where to find more information:  American Diabetes  Association: diabetes.org/food-and-fitness/food  Academy of Nutrition and Dietetics: PokerClues.dk  [employer] of Diabetes and Digestive and Kidney Diseases (NIH): ContactWire.be Summary  A healthy meal plan will help you control your blood glucose and maintain a healthy lifestyle.  Working with a diet and nutrition specialist (dietitian) can help you make a meal plan that is best for you.  Keep in mind that carbohydrates and alcohol have immediate effects on your blood glucose levels. It is important to count carbohydrates and to use alcohol carefully. This information is not intended to replace advice given to you by your health care provider. Make sure you discuss any questions you have with your health care provider. Document Released: 10/21/2004 Document Revised: 02/29/2016 Document Reviewed: 02/29/2016 Elsevier Interactive Patient Education  [employer].

## 2017-06-14 NOTE — Progress Notes (Signed)
Pre visit review using our clinic review tool, if applicable. No additional management support is needed unless otherwise documented below in the visit note. 

## 2017-06-15 LAB — MEASLES/MUMPS/RUBELLA IMMUNITY
Mumps IgG: 23.8 AU/mL
Rubella: 8.23 index
Rubeola IgG: 252 AU/mL

## 2017-06-16 ENCOUNTER — Telehealth: Payer: Self-pay | Admitting: Internal Medicine

## 2017-06-16 NOTE — Telephone Encounter (Signed)
Called in and was given lab results ordered by Dr.  Terese Door ordered on 06/14/17.  MMR blood levels good.  BUN elevated.  Please avoid NSAIDS:  Mobic/meloxidam, Advil, Ibuprofen, etc Recommend increase water intake.  Cholesterol numbers look normal except HDL, good cholesterol slightly low.   A1c 7.6 goal <7.0  Please try healthier diet choices and exercise to get back down. Blood counts normal.

## 2017-06-16 NOTE — Telephone Encounter (Signed)
Patient notified and lab has been resulted.

## 2017-06-16 NOTE — Telephone Encounter (Signed)
Copied from [place] [id number]. Topic: Quick Communication - Lab Results >> Jun 16, 2017  9:12 AM Carolyn Stare wrote:  Pt returning call fo lab results   [phone]

## 2017-07-11 ENCOUNTER — Encounter: Payer: Self-pay | Admitting: Internal Medicine

## 2017-07-14 MED ORDER — GLUCOSE BLOOD VI STRP: ORAL_STRIP | 12 refills | Status: DC

## 2017-07-24 ENCOUNTER — Encounter: Payer: Self-pay | Admitting: Internal Medicine

## 2017-07-27 ENCOUNTER — Encounter: Payer: Self-pay | Admitting: *Deleted

## 2017-07-27 ENCOUNTER — Other Ambulatory Visit: Payer: Self-pay | Admitting: Internal Medicine

## 2017-07-27 DIAGNOSIS — R194 Change in bowel habit: Secondary | ICD-10-CM

## 2017-07-27 DIAGNOSIS — K219 Gastro-esophageal reflux disease without esophagitis: Secondary | ICD-10-CM

## 2017-08-09 DIAGNOSIS — D2271 Melanocytic nevi of right lower limb, including hip: Secondary | ICD-10-CM | POA: Diagnosis not present

## 2017-08-09 DIAGNOSIS — D225 Melanocytic nevi of trunk: Secondary | ICD-10-CM | POA: Diagnosis not present

## 2017-08-09 DIAGNOSIS — D2262 Melanocytic nevi of left upper limb, including shoulder: Secondary | ICD-10-CM | POA: Diagnosis not present

## 2017-08-09 DIAGNOSIS — D2272 Melanocytic nevi of left lower limb, including hip: Secondary | ICD-10-CM | POA: Diagnosis not present

## 2017-08-09 DIAGNOSIS — D2261 Melanocytic nevi of right upper limb, including shoulder: Secondary | ICD-10-CM | POA: Diagnosis not present

## 2017-08-09 DIAGNOSIS — L821 Other seborrheic keratosis: Secondary | ICD-10-CM | POA: Diagnosis not present

## 2017-09-05 ENCOUNTER — Encounter: Payer: Self-pay | Admitting: Gastroenterology

## 2017-09-05 ENCOUNTER — Ambulatory Visit (INDEPENDENT_AMBULATORY_CARE_PROVIDER_SITE_OTHER): Payer: Medicare Other | Admitting: Gastroenterology

## 2017-09-05 VITALS — BP 135/77 | HR 96 | Ht 69.0 in | Wt 183.0 lb

## 2017-09-05 DIAGNOSIS — R197 Diarrhea, unspecified: Secondary | ICD-10-CM | POA: Diagnosis not present

## 2017-09-05 DIAGNOSIS — R438 Other disturbances of smell and taste: Secondary | ICD-10-CM

## 2017-09-05 NOTE — Progress Notes (Signed)
Andrew Gonzalez [address]  [place]  [place], Andrew Gonzalez  Main: [phone]  Fax: [phone]   Gastroenterology Consultation  Referring Provider:     McLean-Scocuzza, Andrew Gonzalez * Primary Care Physician:  Andrew Gonzalez, Andrew Glow, Andrew Gonzalez Primary Gastroenterologist:  Dr. Vonda Gonzalez Reason for Consultation:     Altered bowel habits, GERD        HPI:   Chief complaint: Diarrhea, bad taste in mouth  Andrew Gonzalez is a 67 y.o. y/o male referred for consultation & management  by Dr. Terese Door, Andrew Glow, Andrew Gonzalez.  Patient reports in April 2019 he started having 3-4 loose bowel movements a day, and his grandson was diagnosed with a stool infection at the time.  Other family members also started having diarrhea at the time.  Symptoms resolved after a few weeks, but reoccurred twice since then.  Also had bad taste in mouth intermittently 3-4 times a week during these episodes.  No vomiting or nausea.  He identified the diarrhea to be associated with red meat or lettuce.  He stopped eating these, and symptoms have completely resolved and not recurred in the last 5 weeks.  No weight loss.  No blood in stool.  No dysphagia.  Not on any antireflux or antidiarrheal medications.  No new medications.  No family history of colon cancer. Had a colonoscopy in 2017.  Primary care provider note states polyps were removed but they do not have the pathology report.  He also reports having a colonoscopy 5 to 6 years prior to that and polyps were also removed at that time. No previous EGDs.  Past Medical History:  Diagnosis Date  . Allergy   . Chicken pox   . Colon polyps   . Diabetes mellitus without complication ([place])   . Hyperlipidemia   . Hypertension     Past Surgical History:  Procedure Laterality Date  . TONSILLECTOMY      Prior to Admission medications   Medication Sig Start Date End Date Taking? Authorizing Provider  amLODipine (NORVASC) 2.5 MG tablet Take 1 tablet (2.5  mg total) by mouth daily. 04/14/17   Andrew Gonzalez, Andrew Glow, Andrew Gonzalez  Ascorbic Acid (VITAMIN C PO) Take by mouth.    Provider, Historical, Andrew Gonzalez  aspirin EC 81 MG tablet Take 81 mg by mouth daily.    Provider, Historical, Andrew Gonzalez  atorvastatin (LIPITOR) 20 MG tablet Take 1 tablet (20 mg total) by mouth daily at 6 PM. 04/14/17   Andrew Gonzalez, Andrew Glow, Andrew Gonzalez  cetirizine (ZYRTEC) 10 MG tablet Take 10 mg by mouth daily.    Provider, Historical, Andrew Gonzalez  Cholecalciferol (VITAMIN D-3) 1000 units CAPS Take by mouth.    Provider, Historical, Andrew Gonzalez  Cinnamon 500 MG TABS Take by mouth.    Provider, Historical, Andrew Gonzalez  glucose blood (FREESTYLE LITE) test strip Use as instructed 07/14/17   Andrew Gonzalez, Andrew Glow, Andrew Gonzalez  ibuprofen (ADVIL,MOTRIN) 600 MG tablet Take 600 mg by mouth every 6 (six) hours as needed.    Provider, Historical, Andrew Gonzalez  JANUVIA 100 MG tablet Take 1 tablet (100 mg total) by mouth daily. 04/14/17   Andrew Gonzalez, Andrew Glow, Andrew Gonzalez  metFORMIN (GLUCOPHAGE) 1000 MG tablet Take 1 tablet (1,000 mg total) by mouth 2 (two) times daily with a meal. 04/14/17   Andrew Gonzalez, Andrew Glow, Andrew Gonzalez  pyridOXINE (VITAMIN B-6) 50 MG tablet Take 50 mg by mouth daily.    Provider, Historical, Andrew Gonzalez  tiZANidine (ZANAFLEX) 4 MG tablet TK 1 T PO QHS PRN 04/04/17   Provider,  Historical, Andrew Gonzalez  vitamin B-12 (CYANOCOBALAMIN) 1000 MCG tablet Take 1,000 mcg by mouth daily.    Provider, Historical, Andrew Gonzalez  VITAMIN E PO Take by mouth.    Provider, Historical, Andrew Gonzalez    Family History  Problem Relation Age of Onset  . Cancer Mother        bone, liver, lung former smoker ? lung died 05-09-08  . Diabetes Mother   . Hypertension Mother      Social History   Tobacco Use  . Smoking status: Former Research scientist (life sciences)  . Smokeless tobacco: Never Used  Substance Use Topics  . Alcohol use: No    Frequency: Never  . Drug use: No    Allergies as of 09/05/2017 - Review Complete 06/14/2017  Allergen Reaction Noted  . Biofreeze [menthol (topical analgesic)]  04/17/2017    Review of  Systems:    All systems reviewed and negative except where noted in HPI.   Physical Exam:  There were no vitals taken for this visit. No LMP for male patient. Psych:  Alert and cooperative. Normal mood and affect. General:   Alert,  Well-developed, well-nourished, pleasant and cooperative in NAD Head:  Normocephalic and atraumatic. Eyes:  Sclera clear, no icterus.   Conjunctiva pink. Ears:  Normal auditory acuity. Nose:  No deformity, discharge, or lesions. Mouth:  No deformity or lesions,oropharynx pink & moist. Neck:  Supple; no masses or thyromegaly. Lungs:  Respirations even and unlabored.  Clear throughout to auscultation.   No wheezes, crackles, or rhonchi. No acute distress. Heart:  Regular rate and rhythm; no murmurs, clicks, rubs, or gallops. Abdomen:  Normal bowel sounds.  No bruits.  Soft, non-tender and non-distended without masses, hepatosplenomegaly or hernias noted.  No guarding or rebound tenderness.    Msk:  Symmetrical without gross deformities. Good, equal movement & strength bilaterally. Pulses:  Normal pulses noted. Extremities:  No clubbing or edema.  No cyanosis. Neurologic:  Alert and oriented x3;  grossly normal neurologically. Skin:  Intact without significant lesions or rashes. No jaundice. Lymph Nodes:  No significant cervical adenopathy. Psych:  Alert and cooperative. Normal mood and affect.   Labs: CBC    Component Value Date/Time   WBC 7.3 06/14/2017 0934   RBC 4.40 06/14/2017 0934   HGB 13.1 06/14/2017 0934   HCT 39.3 06/14/2017 0934   PLT 263.0 06/14/2017 0934   MCV 89.4 06/14/2017 0934   MCHC 33.4 06/14/2017 0934   RDW 12.9 06/14/2017 0934   LYMPHSABS 2.0 06/14/2017 0934   MONOABS 0.6 06/14/2017 0934   EOSABS 0.1 06/14/2017 0934   BASOSABS 0.0 06/14/2017 0934   CMP     Component Value Date/Time   NA 137 06/14/2017 0934   K 4.3 06/14/2017 0934   CL 104 06/14/2017 0934   CO2 22 06/14/2017 0934   GLUCOSE 107 (H) 06/14/2017 0934   BUN  26 (H) 06/14/2017 0934   CREATININE 1.05 06/14/2017 0934   CALCIUM 9.4 06/14/2017 0934   PROT 7.2 06/14/2017 0934   ALBUMIN 4.3 06/14/2017 0934   AST 23 06/14/2017 0934   ALT 27 06/14/2017 0934   ALKPHOS 60 06/14/2017 0934   BILITOT 1.2 06/14/2017 0934    Imaging Studies: No results found.  Assessment and Plan:   Pilot Prindle is a 67 y.o. y/o male has been referred for diarrhea that started in April 2019 at the time of his grandson being diagnosed with stool infection, and other family members also having diarrhea at the same time, with no symptoms  over the last 5 weeks whatsoever  Patient likely had infectious diarrhea This is completely resolved at this time He might have also been postinfectious syndrome, that led to the 2 subsequent episodes of diarrhea after the initial episode resolved He identified red meat and lettuce his triggers to his diarrhea and has avoided this in the last 5 weeks He can start reintroducing the slowly after months to see if symptoms recur Since symptoms have resolved, no indication for further testing at this time  If diarrhea reoccurs, I have asked him to call us, as we can obtain stool testing at the time No previous stool test I have educated him about risk factors for diarrhea, including antibiotic use, medications, products with sugars etc.  No dysphagia Bad taste in mouth that occurred at the time of diarrhea is currently resolved.  Symptoms were likely related to gastroenteritis at the time No indication for EGD  We do not have his previous colonoscopy report, primary care provider to review his previous colonoscopy or records including pathology report to determine timing for next colonoscopy.  Last one was in 2017  Dr Andrew Gonzalez

## 2017-09-28 DIAGNOSIS — E1140 Type 2 diabetes mellitus with diabetic neuropathy, unspecified: Secondary | ICD-10-CM | POA: Diagnosis not present

## 2017-09-28 DIAGNOSIS — B351 Tinea unguium: Secondary | ICD-10-CM | POA: Diagnosis not present

## 2017-10-04 ENCOUNTER — Encounter: Payer: Self-pay | Admitting: Internal Medicine

## 2017-10-04 DIAGNOSIS — G8929 Other chronic pain: Secondary | ICD-10-CM | POA: Diagnosis not present

## 2017-10-04 DIAGNOSIS — E119 Type 2 diabetes mellitus without complications: Secondary | ICD-10-CM | POA: Diagnosis not present

## 2017-10-04 DIAGNOSIS — Z79891 Long term (current) use of opiate analgesic: Secondary | ICD-10-CM | POA: Diagnosis not present

## 2017-10-04 DIAGNOSIS — M5136 Other intervertebral disc degeneration, lumbar region: Secondary | ICD-10-CM | POA: Diagnosis not present

## 2017-10-04 DIAGNOSIS — M4316 Spondylolisthesis, lumbar region: Secondary | ICD-10-CM | POA: Diagnosis not present

## 2017-10-04 LAB — HM DIABETES EYE EXAM

## 2017-10-16 ENCOUNTER — Encounter: Payer: Self-pay | Admitting: Internal Medicine

## 2017-10-16 ENCOUNTER — Other Ambulatory Visit: Payer: Self-pay | Admitting: Internal Medicine

## 2017-10-16 ENCOUNTER — Telehealth: Payer: Self-pay | Admitting: Radiology

## 2017-10-16 ENCOUNTER — Ambulatory Visit (INDEPENDENT_AMBULATORY_CARE_PROVIDER_SITE_OTHER): Payer: Medicare Other | Admitting: Internal Medicine

## 2017-10-16 VITALS — BP 132/70 | HR 87 | Temp 97.4°F | Ht 69.0 in | Wt 179.6 lb

## 2017-10-16 DIAGNOSIS — Z23 Encounter for immunization: Secondary | ICD-10-CM

## 2017-10-16 DIAGNOSIS — E559 Vitamin D deficiency, unspecified: Secondary | ICD-10-CM

## 2017-10-16 DIAGNOSIS — Z125 Encounter for screening for malignant neoplasm of prostate: Secondary | ICD-10-CM | POA: Diagnosis not present

## 2017-10-16 DIAGNOSIS — J309 Allergic rhinitis, unspecified: Secondary | ICD-10-CM

## 2017-10-16 DIAGNOSIS — Z1329 Encounter for screening for other suspected endocrine disorder: Secondary | ICD-10-CM

## 2017-10-16 DIAGNOSIS — E119 Type 2 diabetes mellitus without complications: Secondary | ICD-10-CM | POA: Diagnosis not present

## 2017-10-16 DIAGNOSIS — I10 Essential (primary) hypertension: Secondary | ICD-10-CM

## 2017-10-16 DIAGNOSIS — M549 Dorsalgia, unspecified: Secondary | ICD-10-CM | POA: Diagnosis not present

## 2017-10-16 DIAGNOSIS — G8929 Other chronic pain: Secondary | ICD-10-CM | POA: Diagnosis not present

## 2017-10-16 LAB — CBC WITH DIFFERENTIAL/PLATELET
BASOS ABS: 0.1 10*3/uL (ref 0.0–0.1)
Basophils Relative: 0.9 % (ref 0.0–3.0)
EOS ABS: 0.1 10*3/uL (ref 0.0–0.7)
Eosinophils Relative: 1.1 % (ref 0.0–5.0)
HEMATOCRIT: 38.1 % — AB (ref 39.0–52.0)
HEMOGLOBIN: 13.2 g/dL (ref 13.0–17.0)
LYMPHS PCT: 26.4 % (ref 12.0–46.0)
Lymphs Abs: 2.2 10*3/uL (ref 0.7–4.0)
MCHC: 34.5 g/dL (ref 30.0–36.0)
MCV: 87.5 fl (ref 78.0–100.0)
MONO ABS: 0.7 10*3/uL (ref 0.1–1.0)
Monocytes Relative: 8.3 % (ref 3.0–12.0)
Neutro Abs: 5.2 10*3/uL (ref 1.4–7.7)
Neutrophils Relative %: 63.3 % (ref 43.0–77.0)
Platelets: 266 10*3/uL (ref 150.0–400.0)
RBC: 4.36 Mil/uL (ref 4.22–5.81)
RDW: 13.3 % (ref 11.5–15.5)
WBC: 8.2 10*3/uL (ref 4.0–10.5)

## 2017-10-16 LAB — HEMOGLOBIN A1C: HEMOGLOBIN A1C: 7.2 % — AB (ref 4.6–6.5)

## 2017-10-16 LAB — VITAMIN D 25 HYDROXY (VIT D DEFICIENCY, FRACTURES): VITD: 27.75 ng/mL — AB (ref 30.00–100.00)

## 2017-10-16 LAB — PSA, MEDICARE: PSA: 2.15 ng/mL (ref 0.10–4.00)

## 2017-10-16 LAB — TSH: TSH: 1.74 u[IU]/mL (ref 0.35–4.50)

## 2017-10-16 MED ORDER — GLUCOSE BLOOD VI STRP: ORAL_STRIP | 12 refills | Status: DC

## 2017-10-16 MED ORDER — ASPIRIN EC 81 MG PO TBEC: 81.0000 mg | DELAYED_RELEASE_TABLET | Freq: Every day | ORAL | 3 refills | Status: DC

## 2017-10-16 MED ORDER — METFORMIN HCL 1000 MG PO TABS: 1000.0000 mg | ORAL_TABLET | Freq: Two times a day (BID) | ORAL | 3 refills | Status: DC

## 2017-10-16 MED ORDER — SITAGLIPTIN PHOSPHATE 100 MG PO TABS: 100.0000 mg | ORAL_TABLET | Freq: Every day | ORAL | 3 refills | Status: DC

## 2017-10-16 MED ORDER — ATORVASTATIN CALCIUM 20 MG PO TABS: 20.0000 mg | ORAL_TABLET | Freq: Every day | ORAL | 3 refills | Status: DC

## 2017-10-16 MED ORDER — AMLODIPINE BESYLATE 2.5 MG PO TABS: 2.5000 mg | ORAL_TABLET | Freq: Every day | ORAL | 3 refills | Status: DC

## 2017-10-16 MED ORDER — CETIRIZINE HCL 10 MG PO TABS: 10.0000 mg | ORAL_TABLET | Freq: Every day | ORAL | 3 refills | Status: DC

## 2017-10-16 NOTE — Progress Notes (Signed)
Pre visit review using our clinic review tool, if applicable. No additional management support is needed unless otherwise documented below in the visit note. 

## 2017-10-16 NOTE — Telephone Encounter (Signed)
[facility] called and stated that they did not have enough blood to run for CMP but had enough for the rest of blood tests ordered.

## 2017-10-16 NOTE — Telephone Encounter (Signed)
Call pt to reschedule CMET and do not charge   Thx [place]

## 2017-10-16 NOTE — Progress Notes (Signed)
Chief Complaint  Patient presents with  . Follow-up   F/u  1. HTN controlled on norvasc 2.5 qd  2. DM 2 last A1C 7.6 06/2017 will check labs saw podiatry Dr. Cleda Mccreedy 09/28/17 he would like nutrition referral eye exam 10/04/17 neg DM retinopathy right eye cataracts AE. Would like refills on all of med s 3. Chronic pain f/u pain clinic [facility] [place] given mobic and had right hip trigger injection 2 weeks ago mobic not filled by pt    Review of Systems  Constitutional: Positive for weight loss.       4 lbs  HENT: Negative for hearing loss.   Eyes: Negative for blurred vision.  Respiratory: Negative for shortness of breath.   Cardiovascular: Negative for chest pain.  Gastrointestinal: Negative for abdominal pain and diarrhea.  Musculoskeletal: Positive for back pain and joint pain.  Skin: Negative for rash.  Neurological: Negative for headaches.  Psychiatric/Behavioral: Negative for depression.   Past Medical History:  Diagnosis Date  . Allergy   . Chicken pox   . Colon polyps   . Diabetes mellitus without complication ([place])   . Hyperlipidemia   . Hypertension    Past Surgical History:  Procedure Laterality Date  . TONSILLECTOMY     Family History  Problem Relation Age of Onset  . Cancer Mother        bone, liver, lung former smoker ? lung died 05/30/2008  . Diabetes Mother   . Hypertension Mother    Social History   Socioeconomic History  . Marital status: Married    Spouse name: Not on file  . Number of children: Not on file  . Years of education: Not on file  . Highest education level: Not on file  Occupational History  . Not on file  Social Needs  . Financial resource strain: Not on file  . Food insecurity:    Worry: Not on file    Inability: Not on file  . Transportation needs:    Medical: Not on file    Non-medical: Not on file  Tobacco Use  . Smoking status: Former Research scientist (life sciences)  . Smokeless tobacco: Never Used  Substance and Sexual Activity  . Alcohol use: No    Frequency:  Never  . Drug use: No  . Sexual activity: Yes    Partners: Female  Lifestyle  . Physical activity:    Days per week: Not on file    Minutes per session: Not on file  . Stress: Not on file  Relationships  . Social connections:    Talks on phone: Not on file    Gets together: Not on file    Attends religious service: Not on file    Active member of club or organization: Not on file    Attends meetings of clubs or organizations: Not on file    Relationship status: Not on file  . Intimate partner violence:    Fear of current or ex partner: Not on file    Emotionally abused: Not on file    Physically abused: Not on file    Forced sexual activity: Not on file  Other Topics Concern  . 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 amLODipine (NORVASC)  2.5 MG tablet Take 1 tablet (2.5 mg total) by mouth daily.  . Ascorbic Acid (VITAMIN C PO) Take by mouth.  Marland Kitchen aspirin EC 81 MG tablet Take 81 mg by mouth daily.  Marland Kitchen atorvastatin (LIPITOR) 20 MG tablet Take 1 tablet (20 mg total) by mouth daily at 6 PM.  . cetirizine (ZYRTEC) 10 MG tablet Take 10 mg by mouth daily.  . Cholecalciferol (VITAMIN D-3) 1000 units CAPS Take by mouth.  . Cinnamon 500 MG TABS Take by mouth.  Marland Kitchen glucose blood (FREESTYLE LITE) test strip Use as instructed  . ibuprofen (ADVIL,MOTRIN) 600 MG tablet Take 600 mg by mouth every 6 (six) hours as needed.  Marland Kitchen JANUVIA 100 MG tablet Take 1 tablet (100 mg total) by mouth daily.  . metFORMIN (GLUCOPHAGE) 1000 MG tablet Take 1 tablet (1,000 mg total) by mouth 2 (two) times daily with a meal.  . pyridOXINE (VITAMIN B-6) 50 MG tablet Take 50 mg by mouth daily.  Marland Kitchen tiZANidine (ZANAFLEX) 4 MG tablet TK 1 T PO QHS PRN  . vitamin B-12 (CYANOCOBALAMIN) 1000 MCG tablet Take 1,000  mcg by mouth daily.  Marland Kitchen VITAMIN E PO Take by mouth.   Allergies  Allergen Reactions  . Biofreeze [Menthol (Topical Analgesic)]     Rash    Recent Results (from the past 2160 hour(s))  HM DIABETES EYE EXAM     Status: None   Collection Time: 10/04/17 12:00 AM  Result Value Ref Range   HM Diabetic Eye Exam No Retinopathy No Retinopathy    Comment:  eye    Objective  Body mass index is 26.52 kg/m. Wt Readings from Last 3 Encounters:  10/16/17 179 lb 9.6 oz (81.5 kg)  09/05/17 183 lb (83 kg)  06/14/17 184 lb 12.8 oz (83.8 kg)   Temp Readings from Last 3 Encounters:  10/16/17 (!) 97.4 F (36.3 C) (Oral)  06/14/17 97.7 F (36.5 C) (Oral)  04/14/17 97.9 F (36.6 C) (Oral)   BP Readings from Last 3 Encounters:  10/16/17 132/70  09/05/17 135/77  06/14/17 126/70   Pulse Readings from Last 3 Encounters:  10/16/17 87  09/05/17 96  06/14/17 90    Physical Exam  Constitutional: He is oriented to person, place, and time. Vital signs are normal. He appears well-developed and well-nourished. He is cooperative.  HENT:  Head: Normocephalic and atraumatic.  Mouth/Throat: Oropharynx is clear and moist and mucous membranes are normal.  Eyes: Pupils are equal, round, and reactive to light. Conjunctivae are normal.  Cardiovascular: Normal rate, regular rhythm and normal heart sounds.  Pulmonary/Chest: Effort normal and breath sounds normal.  Neurological: He is alert and oriented to person, place, and time. Gait normal.  Skin: Skin is warm, dry and intact.  Psychiatric: He has a normal mood and affect. His speech is normal and behavior is normal. Judgment and thought content normal. Cognition and memory are normal.  Nursing note and vitals reviewed.   Assessment   1. DM 2 A1C 7.6  2. HTN  3. Chronic pain I.e back  4. HM Plan   1. Refilled all meds  Consider change CCB to ARB in future  Eye exam 10/04/17 neg retinopathy  Foot exam Dr. Cleda Mccreedy 09/28/17 Referred nutrition   2. Refilled meds  3. F/u pain clinic [facility]  4.  Flu shot given today  Tdap utd  prevnar rec and will need pna 23 in 1 year [place]  shingrix had 1/2 6 weeks ago  Check fasting labs today lipid nl 06/2017  DRE in future   colonoscopy had 11/23/15 moderate to severe diverticulosis 2 polyps no pathology report noted Dr. Rexene Alberts [facility] requested recordsfor pathology report -rec repeat in 5 years with h/o polyps  Declines hep B/C testing for now  Referred to dermatology saw Dr. Kellie Moor tbse multiple nevi due to see again 02/2018    Provider: Dr. Olivia Mackie McLean-Scocuzza-Internal Medicine

## 2017-10-16 NOTE — Patient Instructions (Addendum)
[place]nar vaccine [place]  Avoid Meloxicam due to elevated BUN #s last set of labs 06/2017  F/u in 4 months    Pneumococcal Conjugate Vaccine suspension for injection What is this medicine? PNEUMOCOCCAL VACCINE (NEU mo KOK al vak SEEN) is a vaccine used to prevent pneumococcus bacterial infections. These bacteria can cause serious infections like pneumonia, meningitis, and blood infections. This vaccine will lower your chance of getting pneumonia. If you do get pneumonia, it can make your symptoms milder and your illness shorter. This vaccine will not treat an infection and will not cause infection. This vaccine is recommended for infants and young children, adults with certain medical conditions, and adults 46 years or older. This medicine may be used for other purposes; ask your health care provider or pharmacist if you have questions. COMMON BRAND NAME(S): Prevnar, Prevnar 13 What should I tell my health care provider before I take this medicine? They need to know if you have any of these conditions: -bleeding problems -fever -immune system problems -an unusual or allergic reaction to pneumococcal vaccine, diphtheria toxoid, other vaccines, latex, other medicines, foods, dyes, or preservatives -pregnant or trying to get pregnant -breast-feeding How should I use this medicine? This vaccine is for injection into a muscle. It is given by a health care professional. A copy of Vaccine Information Statements will be given before each vaccination. Read this sheet carefully each time. The sheet may change frequently. Talk to your pediatrician regarding the use of this medicine in children. While this drug may be prescribed for children as young as 62 weeks old for selected conditions, precautions do apply. Overdosage: If you think you have taken too much of this medicine contact a poison control center or emergency room at once. NOTE: This medicine is only for you. Do not share this medicine  with others. What if I miss a dose? It is important not to miss your dose. Call your doctor or health care professional if you are unable to keep an appointment. What may interact with this medicine? -medicines for cancer chemotherapy -medicines that suppress your immune function -steroid medicines like prednisone or cortisone This list may not describe all possible interactions. Give your health care provider a list of all the medicines, herbs, non-prescription drugs, or dietary supplements you use. Also tell them if you smoke, drink alcohol, or use illegal drugs. Some items may interact with your medicine. What should I watch for while using this medicine? Mild fever and pain should go away in 3 days or less. Report any unusual symptoms to your doctor or health care professional. What side effects may I notice from receiving this medicine? Side effects that you should report to your doctor or health care professional as soon as possible: -allergic reactions like skin rash, itching or hives, swelling of the face, lips, or tongue -breathing problems -confused -fast or irregular heartbeat -fever over 102 degrees F -seizures -unusual bleeding or bruising -unusual muscle weakness Side effects that usually do not require medical attention (report to your doctor or health care professional if they continue or are bothersome): -aches and pains -diarrhea -fever of 102 degrees F or less -headache -irritable -loss of appetite -pain, tender at site where injected -trouble sleeping This list may not describe all possible side effects. Call your doctor for medical advice about side effects. You may report side effects to FDA at 1-800-FDA-1088. Where should I keep my medicine? This does not apply. This vaccine is given in a clinic, pharmacy,  doctor's office, or other health care setting and will not be stored at home. NOTE: This sheet is a summary. It may not cover all possible information. If you  have questions about this medicine, talk to your doctor, pharmacist, or health care provider.  2018 Elsevier/Gold Standard (2013-10-31 10:27:27)

## 2017-10-17 ENCOUNTER — Other Ambulatory Visit (INDEPENDENT_AMBULATORY_CARE_PROVIDER_SITE_OTHER): Payer: Medicare Other

## 2017-10-17 DIAGNOSIS — M549 Dorsalgia, unspecified: Secondary | ICD-10-CM | POA: Diagnosis not present

## 2017-10-17 DIAGNOSIS — I10 Essential (primary) hypertension: Secondary | ICD-10-CM | POA: Diagnosis not present

## 2017-10-17 DIAGNOSIS — M4316 Spondylolisthesis, lumbar region: Secondary | ICD-10-CM | POA: Diagnosis not present

## 2017-10-17 DIAGNOSIS — M5136 Other intervertebral disc degeneration, lumbar region: Secondary | ICD-10-CM | POA: Diagnosis not present

## 2017-10-17 DIAGNOSIS — G8929 Other chronic pain: Secondary | ICD-10-CM | POA: Diagnosis not present

## 2017-10-17 LAB — COMPREHENSIVE METABOLIC PANEL
ALBUMIN: 4.3 g/dL (ref 3.5–5.2)
ALT: 15 U/L (ref 0–53)
AST: 14 U/L (ref 0–37)
Alkaline Phosphatase: 62 U/L (ref 39–117)
BUN: 17 mg/dL (ref 6–23)
CHLORIDE: 104 meq/L (ref 96–112)
CO2: 25 meq/L (ref 19–32)
Calcium: 9.2 mg/dL (ref 8.4–10.5)
Creatinine, Ser: 1.06 mg/dL (ref 0.40–1.50)
GFR: 74.17 mL/min (ref >60.00)
Glucose, Bld: 153 mg/dL — ABNORMAL HIGH (ref 70–99)
Potassium: 4.3 mEq/L (ref 3.5–5.1)
SODIUM: 139 meq/L (ref 135–145)
Total Bilirubin: 1 mg/dL (ref 0.2–1.2)
Total Protein: 7.2 g/dL (ref 6.0–8.3)

## 2017-10-17 LAB — URINALYSIS, ROUTINE W REFLEX MICROSCOPIC
Bilirubin Urine: NEGATIVE
Glucose, UA: NEGATIVE
Hgb urine dipstick: NEGATIVE
LEUKOCYTES UA: NEGATIVE
NITRITE: NEGATIVE
PROTEIN: NEGATIVE
SPECIFIC GRAVITY, URINE: 1.023 (ref 1.001–1.03)
pH: <=5 (ref 5.0–8.0)

## 2017-10-17 LAB — MICROALBUMIN / CREATININE URINE RATIO
CREATININE, URINE: 183 mg/dL (ref 20–320)
MICROALB UR: 2.3 mg/dL
MICROALB/CREAT RATIO: 13 ug/mg{creat} (ref <30)

## 2017-10-17 NOTE — Telephone Encounter (Signed)
Cma please call pt to reschedule

## 2017-10-17 NOTE — Telephone Encounter (Signed)
mychart message has been sent to inform patient. 

## 2017-11-07 ENCOUNTER — Encounter: Payer: Self-pay | Admitting: Dietician

## 2017-11-07 ENCOUNTER — Encounter: Payer: Medicare Other | Attending: Internal Medicine | Admitting: Dietician

## 2017-11-07 VITALS — Ht 69.0 in | Wt 176.5 lb

## 2017-11-07 DIAGNOSIS — E119 Type 2 diabetes mellitus without complications: Secondary | ICD-10-CM | POA: Diagnosis not present

## 2017-11-07 DIAGNOSIS — Z713 Dietary counseling and surveillance: Secondary | ICD-10-CM | POA: Insufficient documentation

## 2017-11-07 NOTE — Progress Notes (Signed)
Medical Nutrition Therapy: Visit start time: 0900  end time: 1000  Assessment:  Diagnosis: Diabetes Past medical history: HTN, hyperlipidemia, diverticulosis Psychosocial issues/ stress concerns: none  Preferred learning method:  . Auditory . Visual . Hands-on  Current weight: 176.5lbs Height: 5'9" Medications, supplements: reconciled list in medical record  Progress and evaluation: Patient is checking BGs daily, fasting, with results in 120s or below. sometimes checks mid afternoon with results in 130s or below. Reports last HbA1C of 7.1%. Denies any hypoglycemic episodes. He is concerned about recurring episodes of diarrhea over the past few months, starting when he contracted a virus last May. He has stopped eating beef and tossed salads, as they have seemed to trigger diarrhea episodes. He reports increased gurgling noises in his abdomen, and sometimes foul taste when belching. He requests help in controlling diarrhea while also controlling diabetes. Patient would also like to lose about 5-10 more lbs; he has lost from a high weight of over 180lbs.    Physical activity: waking 1 hour, 3-4x a week  Dietary Intake:  Usual eating pattern includes 3 meals and 0-1 snacks per day. Dining out frequency: 2 meals per week.  Breakfast: cheerios or life cereal Snack: none Lunch: sandwich with [place] Snack: peanuts or crackers, or popcorn Supper: vegetables and lean meat, sometimes breakfast foods or cereal Snack: none or same as pm Beverages: water, occasional diet soda  Nutrition Care Education: Topics covered: diabetes, digestive health Basic nutrition: basic food groups, appropriate nutrient balance, appropriate meal and snack schedule Weight control: small, frequent meals; appropriate food portions of starches; low fat foods Diabetes:  goals for BGs, appropriate meal and snack schedule, appropriate carb intake and balance Digestive health: diverticulosis and diverticulitis and avoiding  large portions of hard foods such as corn, popcorn, nuts, seeds; limiting fiber and highly acidic foods to aid digestive process; possible benefits of probiotics and prebiotics; eating small meals frequently and chewing foods thoroughly.  Nutritional Diagnosis:  [place]-2.2 Altered nutrition-related laboratory As related to diabetes.  As evidenced by patient with recent HbA1C of 7.1%. [place]-1.4 Altered GI function As related to episodes of diarrhea.  As evidenced by patient report of digestive symptoms.  Intervention: Instruction as noted above.   Set goals with input from patient; he will work to eat smaller meals and eat more often during the day.    Patient has been making healthy food choices and working to control BGs.   Education Materials given:  . Plate Planner with food lists . Sample menus . Fiber Restricted Diet (NCM) . Goals/ instructions   Learner/ who was taught:  . Patient   Level of understanding: Marland Kitchen Verbalizes/ demonstrates competency   Demonstrated degree of understanding via:   Teach back Learning barriers: . None  Willingness to learn/ readiness for change: . Eager, change in progress   Monitoring and Evaluation:  Dietary intake, exercise, BG control, GI symptoms, and body weight      follow up: 12/05/17

## 2017-11-07 NOTE — Patient Instructions (Signed)
   Continue to avoid red meats, large portions of raw veggies.   Include a protein source with meals and/or snacks 3 or more times a day.   Plan to eat something every 3-5 hours, small meals and snacks to help digestion.   Limit or avoid high fiber foods such as bran, tough skins and seeds on fresh produce, stringy foods, nuts, popcorn, seeds.   Consider including some fermented foods such as naturally made sourdough bread or sauerkraut, Kefir (yogurt type drink), fermented soy products like tempeh (protein meat substitute).   Or consider a probiotic supplement such as Align or Culturelle.

## 2017-11-08 ENCOUNTER — Encounter: Payer: Self-pay | Admitting: Family Medicine

## 2017-11-08 ENCOUNTER — Ambulatory Visit (INDEPENDENT_AMBULATORY_CARE_PROVIDER_SITE_OTHER): Payer: Medicare Other | Admitting: Family Medicine

## 2017-11-08 VITALS — BP 130/68 | HR 92 | Temp 97.5°F | Ht 69.0 in | Wt 177.0 lb

## 2017-11-08 DIAGNOSIS — R05 Cough: Secondary | ICD-10-CM

## 2017-11-08 DIAGNOSIS — R058 Other specified cough: Secondary | ICD-10-CM

## 2017-11-08 DIAGNOSIS — J00 Acute nasopharyngitis [common cold]: Secondary | ICD-10-CM

## 2017-11-08 DIAGNOSIS — R0982 Postnasal drip: Secondary | ICD-10-CM

## 2017-11-08 DIAGNOSIS — R0981 Nasal congestion: Secondary | ICD-10-CM

## 2017-11-08 MED ORDER — BENZONATATE 100 MG PO CAPS: 100.0000 mg | ORAL_CAPSULE | Freq: Three times a day (TID) | ORAL | 0 refills | Status: DC | PRN

## 2017-11-08 MED ORDER — FLUTICASONE PROPIONATE 50 MCG/ACT NA SUSP: 2.0000 | Freq: Every day | NASAL | 1 refills | Status: DC

## 2017-11-08 MED ORDER — METHYLPREDNISOLONE ACETATE 40 MG/ML IJ SUSP
40.0000 mg | Freq: Once | INTRAMUSCULAR | Status: AC
  Administered 2017-11-08: 40 mg via INTRAMUSCULAR

## 2017-11-08 NOTE — Progress Notes (Signed)
Subjective:    Patient ID: Andrew Gonzalez, male    DOB: May 09, 1950, 67 y.o.   MRN: [id number]  HPI   Patient presents to clinic complaining of dry cough, nasal congestion, earache for about 1 week; did have some scratchy throat but this has resolved.  States he recently returned from a trip to [place], believes he may have had a sick contact during the trip.  Denies any fever chills.  Denies any purulent drainage from nose.  Denies any phlegm with cough.  States he took 1 dose of Zyrtec earlier this week, but has not taken this medication consistently.   Patient Active Problem List   Diagnosis Date Noted  . Allergic rhinitis 10/16/2017  . HTN (hypertension) 04/14/2017  . HLD (hyperlipidemia) 04/14/2017  . DM2 (diabetes mellitus, type 2) ([place]) 04/14/2017  . Colon polyps 04/14/2017  . Dupuytren contracture 04/14/2017  . Vitamin D deficiency 04/14/2017  . Seasonal allergies 04/14/2017  . Chronic back pain 04/14/2017   Social History   Tobacco Use  . Smoking status: Former Research scientist (life sciences)  . Smokeless tobacco: Never Used  Substance Use Topics  . Alcohol use: Not on file    Comment: occasional, maybe once a week   Review of Systems   Constitutional: Negative for chills, fatigue and fever.  HENT: +congestion, ear pain, sinus pain and scratchy throat. Eyes: Negative.   Respiratory: Negative for cough, shortness of breath and wheezing.   Cardiovascular: Negative for chest pain, palpitations and leg swelling.  Gastrointestinal: Negative for abdominal pain, diarrhea, nausea and vomiting.  Genitourinary: Negative for dysuria, frequency and urgency.  Musculoskeletal: Negative for arthralgias and myalgias.  Skin: Negative for color change, pallor and rash.  Neurological: Negative for syncope, light-headedness and headaches.  Psychiatric/Behavioral: The patient is not nervous/anxious.       Objective:   Physical Exam  Constitutional: He is oriented to person, place, and time. He appears  well-nourished. No distress.  HENT:  Head: Normocephalic and atraumatic.  Mouth/Throat: No oropharyngeal exudate.  Mild fullness bilateral TMs. +post nasal drip. +clear nasal drainage. Nares inflamed.  Eyes: Conjunctivae and EOM are normal. Right eye exhibits no discharge. Left eye exhibits no discharge. No scleral icterus.  Neck: Neck supple. No tracheal deviation present.  Cardiovascular: Normal rate and regular rhythm.  Pulmonary/Chest: Effort normal and breath sounds normal. No stridor. No respiratory distress. He has no wheezes. He has no rales.  Musculoskeletal: He exhibits no edema.  Gait normal.   Neurological: He is alert and oriented to person, place, and time.  Skin: Skin is warm and dry. He is not diaphoretic. No pallor.  Psychiatric: He has a normal mood and affect. His behavior is normal.  Nursing note and vitals reviewed.     Vitals:   11/08/17 0803  BP: 130/68  Pulse: 92  Temp: (!) 97.5 F (36.4 C)  SpO2: 96%    Assessment & Plan:    Common cold/dry cough/nasal congestion/PND -- Take zyrtec every day for at least next 1-2 weeks to help dry up congestion. Use flonase nasal spray, angle tip of spray bottle towards sides of nose to avoid shooting medicine straight back & causing yourself to swallow it. Rest, increase fluids, do saline nasal washes, do good handwashing. IM steroid given x1 in clinic to help inflammation  Administrations This Visit    methylPREDNISolone acetate (DEPO-MEDROL) injection 40 mg    Admin Date 11/08/2017 Action Given Dose 40 mg Route Intramuscular Administered By Neta Ehlers, RMA  Keep regular follow up as planned. Return to clinic sooner if symptoms persist or worsen.

## 2017-11-08 NOTE — Patient Instructions (Signed)
Take zyrtec every day for at least next 1-2 weeks to help dry up congestion  Use flonase nasal spray, angle tip of spray bottle towards sides of nose to avoid shooting medicine straight back & causing yourself to swallow  Rest, increase fluids  Postnasal Drip Postnasal drip is the feeling of mucus going down the back of your throat. Mucus is a slimy substance that moistens and cleans your nose and throat, as well as the air pockets in face bones near your forehead and cheeks (sinuses). Small amounts of mucus pass from your nose and sinuses down the back of your throat all the time. This is normal. When you produce too much mucus or the mucus gets too thick, you can feel it. Some common causes of postnasal drip include:  Having more mucus because of: ? A cold or the flu. ? Allergies. ? Cold air. ? Certain medicines.  Having more mucus that is thicker because of: ? A sinus or nasal infection. ? Dry air. ? A food allergy.  Follow these instructions at home: Relieving discomfort  Gargle with a salt-water mixture 3-4 times a day or as needed. To make a salt-water mixture, completely dissolve -1 tsp of salt in 1 cup of warm water.  If the air in your home is dry, use a humidifier to add moisture to the air.  Use a saline spray or container (neti pot) to flush out the nose (nasal irrigation). These methods can help clear away mucus and keep the nasal passages moist. General instructions  Take over-the-counter and prescription medicines only as told by your health care provider.  Follow instructions from your health care provider about eating or drinking restrictions. You may need to avoid caffeine.  Avoid things that you know you are allergic to (allergens), like dust, mold, pollen, pets, or certain foods.  Drink enough fluid to keep your urine pale yellow.  Keep all follow-up visits as told by your health care provider. This is important. Contact a health care provider if:  You  have a fever.  You have a sore throat.  You have difficulty swallowing.  You have headache.  You have sinus pain.  You have a cough that does not go away.  The mucus from your nose becomes thick and is green or yellow in color.  You have cold or flu symptoms that last more than 10 days. Summary  Postnasal drip is the feeling of mucus going down the back of your throat.  If your health care provider approves, use nasal irrigation or a nasal spray 2?4 times a day.  Avoid things that you know you are allergic to (allergens), like dust, mold, pollen, pets, or certain foods. This information is not intended to replace advice given to you by your health care provider. Make sure you discuss any questions you have with your health care provider. Document Released: 05/09/2016 Document Revised: 05/09/2016 Document Reviewed: 05/09/2016 Elsevier Interactive Patient Education  [employer].

## 2017-11-14 DIAGNOSIS — M549 Dorsalgia, unspecified: Secondary | ICD-10-CM | POA: Diagnosis not present

## 2017-11-14 DIAGNOSIS — M5136 Other intervertebral disc degeneration, lumbar region: Secondary | ICD-10-CM | POA: Diagnosis not present

## 2017-11-14 DIAGNOSIS — M4802 Spinal stenosis, cervical region: Secondary | ICD-10-CM | POA: Diagnosis not present

## 2017-11-14 DIAGNOSIS — M47812 Spondylosis without myelopathy or radiculopathy, cervical region: Secondary | ICD-10-CM | POA: Diagnosis not present

## 2017-11-14 DIAGNOSIS — M545 Low back pain: Secondary | ICD-10-CM | POA: Diagnosis not present

## 2017-11-14 DIAGNOSIS — M4316 Spondylolisthesis, lumbar region: Secondary | ICD-10-CM | POA: Diagnosis not present

## 2017-11-14 DIAGNOSIS — G8929 Other chronic pain: Secondary | ICD-10-CM | POA: Diagnosis not present

## 2017-12-05 ENCOUNTER — Encounter: Payer: Medicare Other | Admitting: Dietician

## 2017-12-05 ENCOUNTER — Encounter: Payer: Self-pay | Admitting: Dietician

## 2017-12-05 VITALS — Ht 69.0 in | Wt 172.4 lb

## 2017-12-05 DIAGNOSIS — Z713 Dietary counseling and surveillance: Secondary | ICD-10-CM | POA: Diagnosis not present

## 2017-12-05 DIAGNOSIS — E119 Type 2 diabetes mellitus without complications: Secondary | ICD-10-CM

## 2017-12-05 NOTE — Progress Notes (Signed)
Medical Nutrition Therapy: Visit start time: 0900  end time: 0930  Assessment:  Diagnosis: diabetes Medical history changes: none Psychosocial issues/ stress concerns: none  Current weight: 172.4lbs  Height: 5'9" Medications, supplement changes: no changes  Progress and evaluation: Patient reports his fasting BGs continue to average 120s or less; did have a high BG over 200 after receiving shingles vaccine. He has tried Kefir drink with good GI results, less gurgling in abdomen, less diarrhea/ loose stools. He has also been using sourdough bread for sandwiches. He attributes his 4-lb weight loss in the past month to decreased intake of bread and other starchy foods, and ongoing exercise.   Physical activity: walking 1 hour 3-4 times a week  Dietary Intake:  Usual eating pattern includes 3 meals and 1-2 snacks per day. Dining out frequency: 1-2 meals per week.  Breakfast: cheerios or cinnamon life cereal Snack: none Lunch: sandwich on sourdough (1 slice) with ham or [place] and cheese (sometimes) Snack: peanuts or crackers or popcorn Supper: lean meat and vegetables or eggs with bagel thin, or cereal Snack: sometimes apple Beverages: water, diet soda  Nutrition Care Education: Topics covered: weight control, diabetes, GI health Weight control: identifying healthy weight, reviewed role of physical activity Diabetes: reviewed importance of eating at regular intervals, and inclusion of protein sources with meals. Discussed goal for HbA1C test, and effects of physical stress/ illness/ pain on BGs.  GI health: use of fermented foods, tapering off use after several weeks if desired.   Nutritional Diagnosis:  [place]-2.2 Altered nutrition-related laboratory As related to diabetes.  As evidenced by recent HbA1C of 7.1%. [place]-1.4 Altered GI function As related to history of diarrhea and abdominal sounds.  As evidenced by patient report of GI symptoms, recently relieved with use of  Kefir.  Intervention: Instruction/ discussion as noted above.   Patient will continue with current eating pattern and regular exercise.   Scheduled one additional follow-up in 2 months per patient request.  Education Materials given:  Marland Kitchen Goals/ instructions  Learner/ who was taught:  . Patient    Level of understanding: Marland Kitchen Verbalizes/ demonstrates competency   Demonstrated degree of understanding via:   Teach back Learning barriers: . None  Willingness to learn/ readiness for change: . Eager, change in progress   Monitoring and Evaluation:  Dietary intake, exercise, BG control, GI symptoms, and body weight      follow up: 02/13/2018

## 2017-12-05 NOTE — Patient Instructions (Signed)
   Continue with eating regularly and including protein sources with meals.   Try overnight oats -- mix 1/2 cup old fashioned oats with 1/2-1 tsp brown sugar, a dash of cinnamon, and 3/4-1 cup milk or water. Cover and keep in fridge overnight, then the next morning warm in microwave and add nuts and fruit.   Keep up regular walking or other exercise, great job!

## 2017-12-07 ENCOUNTER — Ambulatory Visit (INDEPENDENT_AMBULATORY_CARE_PROVIDER_SITE_OTHER): Payer: Medicare Other

## 2017-12-07 VITALS — BP 118/62 | HR 83 | Temp 97.9°F | Resp 15 | Ht 69.0 in | Wt 173.8 lb

## 2017-12-07 DIAGNOSIS — Z0000 Encounter for general adult medical examination without abnormal findings: Secondary | ICD-10-CM

## 2017-12-07 NOTE — Progress Notes (Signed)
Agree with below   TMS 

## 2017-12-07 NOTE — Patient Instructions (Addendum)
  Andrew Gonzalez , Thank you for taking time to come for your Medicare Wellness Visit. I appreciate your ongoing commitment to your health goals. Please review the following plan we discussed and let me know if I can assist you in the future.   These are the goals we discussed: Goals    . Weight 165lb       This is a list of the screening recommended for you and due dates:  Health Maintenance  Topic Date Due  .  Hepatitis C: One time screening is recommended by Center for Disease Control  (CDC) for  adults born from [birth year] through 1964.   06/15/2018*  . Hemoglobin A1C  04/16/2018  . Complete foot exam   09/29/2018  . Eye exam for diabetics  10/05/2018  . Urine Protein Check  10/17/2018  . Pneumonia vaccines (2 of 2 - PPSV23) 10/19/2018  . Colon Cancer Screening  11/22/2025  . Tetanus Vaccine  05/05/2027  . Flu Shot  Completed  *Topic was postponed. The date shown is not the original due date.

## 2017-12-07 NOTE — Progress Notes (Signed)
Subjective:   Andrew Gonzalez is a 67 y.o. male who presents for an Initial Medicare Annual Wellness Visit.  Review of Systems  No ROS.  Medicare Wellness Visit. Additional risk factors are reflected in the social history. Cardiac Risk Factors include: advanced age (>35men, >32 women);male gender;hypertension;diabetes mellitus    Objective:    Today's Vitals   12/07/17 1050 12/07/17 1207  BP: 118/62   Pulse: 83   Resp: 15   Temp: 97.9 F (36.6 C)   TempSrc: Oral   SpO2: 98%   Weight: 173 lb 12.8 oz (78.8 kg)   Height: 5\' 9"  (1.753 m)   PainSc:  0-No pain   Body mass index is 25.67 kg/m.  Advanced Directives 12/07/2017 11/07/2017  Does Patient Have a Medical Advance Directive? No No  Would patient like information on creating a medical advance directive? No - Patient declined Yes (MAU/Ambulatory/Procedural Areas - Information given)    Current Medications (verified) Outpatient Encounter Medications as of 12/07/2017  Medication Sig  . amLODipine (NORVASC) 2.5 MG tablet Take 1 tablet (2.5 mg total) by mouth daily.  Marland Kitchen aspirin EC 81 MG tablet Take 1 tablet (81 mg total) by mouth daily.  Marland Kitchen atorvastatin (LIPITOR) 20 MG tablet Take 1 tablet (20 mg total) by mouth daily at 6 PM.  . cetirizine (ZYRTEC) 10 MG tablet Take 1 tablet (10 mg total) by mouth daily.  . Cholecalciferol (VITAMIN D-3) 1000 units CAPS Take by mouth.  . Cinnamon 500 MG TABS Take by mouth.  . fluticasone (FLONASE) 50 MCG/ACT nasal spray Place 2 sprays into both nostrils daily.  Marland Kitchen glucose blood (FREESTYLE LITE) test strip Use as instructed qd to bid E11.9  . metFORMIN (GLUCOPHAGE) 1000 MG tablet Take 1 tablet (1,000 mg total) by mouth 2 (two) times daily with a meal.  . Multiple Vitamin (MULTIVITAMIN) tablet Take 1 tablet by mouth daily.  . sitaGLIPtin (JANUVIA) 100 MG tablet Take 1 tablet (100 mg total) by mouth daily.  . [DISCONTINUED] benzonatate (TESSALON) 100 MG capsule Take 1 capsule (100 mg total) by mouth  3 (three) times daily as needed for cough.   No facility-administered encounter medications on file as of 12/07/2017.     Allergies (verified) Biofreeze [menthol (topical analgesic)]   History: Past Medical History:  Diagnosis Date  . Allergy   . Chicken pox   . Colon polyps   . Diabetes mellitus without complication ([place])   . Hyperlipidemia   . Hypertension    Past Surgical History:  Procedure Laterality Date  . TONSILLECTOMY     Family History  Problem Relation Age of Onset  . Cancer Mother        bone, liver, lung former smoker ? lung died 05/28/2008  . Diabetes Mother   . Hypertension Mother   . Atrial fibrillation Sister    Social History   Socioeconomic History  . Marital status: Married    Spouse name: Not on file  . Number of children: Not on file  . Years of education: Not on file  . Highest education level: Not on file  Occupational History  . Not on file  Social Needs  . Financial resource strain: Not hard at all  . Food insecurity:    Worry: Never true    Inability: Never true  . Transportation needs:    Medical: No    Non-medical: No  Tobacco Use  . Smoking status: Former Research scientist (life sciences)  . Smokeless tobacco: Never Used  Substance and Sexual Activity  .  Alcohol use: Not on file    Comment: occasional, maybe once a week  . Drug use: No  . Sexual activity: Yes    Partners: Female  Lifestyle  . Physical activity:    Days per week: 3 days    Minutes per session: 30 min  . Stress: Not at all  Relationships  . Social connections:    Talks on phone: Not on file    Gets together: Not on file    Attends religious service: Not on file    Active member of club or organization: Not on file    Attends meetings of clubs or organizations: Not on file    Relationship status: Married  Other Topics Concern  . Not on file  Social History Narrative   Wears selt belt    Safe in relationship    Recently moved from [facility] 2018/19 originally from [facility] dad and sister still  liver there    Retired Occupational hygienist degree    Tobacco Counseling Counseling given: Not Answered   Clinical Intake:  Pre-visit preparation completed: Yes  Pain : No/denies pain Pain Score: 0-No pain     Nutritional Status: BMI 25 -29 Overweight Diabetes: Yes(Followed by pcp)  How often do you need to have someone help you when you read instructions, pamphlets, or other written materials from your doctor or pharmacy?: 1 - Never  Interpreter Needed?: No     Activities of Daily Living In your present state of health, do you have any difficulty performing the following activities: 12/07/2017  Hearing? N  Vision? N  Difficulty concentrating or making decisions? N  Walking or climbing stairs? N  Dressing or bathing? N  Doing errands, shopping? N  Preparing Food and eating ? N  Using the Toilet? N  In the past six months, have you accidently leaked urine? N  Do you have problems with loss of bowel control? N  Managing your Medications? N  Managing your Finances? N  Housekeeping or managing your Housekeeping? N  Some recent data might be hidden     Immunizations and Health Maintenance Immunization History  Administered Date(s) Administered  . Influenza, High Dose Seasonal PF 10/16/2017  . Influenza-Unspecified 12/22/2016  . Pneumococcal Conjugate-13 10/18/2017  . Tdap 05/04/2017  . Zoster 08/27/2017   There are no preventive care reminders to display for this patient.  Patient Care Team: McLean-Scocuzza, Nino Glow, MD as PCP - General (Internal Medicine)  Indicate any recent Medical Services you may have received from other than [facility] providers in the past year (date may be approximate).    Assessment:   This is a routine wellness examination for [name].  The goal of the wellness visit is to assist the patient how to close the gaps in care and create a preventative care plan for the patient.   The roster of all physicians providing medical care  to patient is listed in the Snapshot section of the chart.  Taking calcium VIT D as appropriate/Osteoporosis risk reviewed.    Safety issues reviewed; Smoke and carbon monoxide detectors in the home. No firearms in the home. Wears seatbelts when driving or riding with others. No violence in the home.  They do not have excessive sun exposure.  Discussed the need for sun protection: hats, long sleeves and the use of sunscreen if there is significant sun exposure.  Patient is alert, normal appearance, oriented to person/place/and time. Correctly identified the president of the [place] and recalls of  3/3 words.Performs simple calculations and can read correct time from watch face.  Displays appropriate judgement.  No new identified risk were noted.  No failures at ADL's or IADL's.    BMI- discussed the importance of a healthy diet, water intake and the benefits of aerobic exercise. Currently seeing a dietician in regards to his diet; his goal is to eat healthier. Exercises 3 days weekly 30 minutes, adequate water intake.  Dental- every 6 months.  Sleep patterns- Sleeps 7-8 hours at night.  Wakes feeling rested.   Diabetes-followed by pcp.   Podiatrist-visits every 3 months. Dr. Cleda Mccreedy.   Health maintenance gaps- closed.  Patient Concerns: None at this time. Follow up with PCP as needed.  Hearing/Vision screen Hearing Screening Comments: Patient is able to hear conversational tones without difficulty.  No issues reported.  Vision Screening Comments: Followed by [facility] Wears corrective lenses Last OV 09/2017 Visual acuity not assessed per patient preference since they have regular follow up with the ophthalmologist  Dietary issues and exercise activities discussed: Current Exercise Habits: Home exercise routine, Type of exercise: walking;treadmill(Golf), Time (Minutes): 30, Frequency (Times/Week): 3, Weekly Exercise (Minutes/Week): 90, Intensity: Moderate  Goals       Patient Stated   . Weight 165lb (pt-stated)      Depression Screen PHQ 2/9 Scores 12/07/2017 11/07/2017 10/16/2017 06/14/2017  PHQ - 2 Score 0 0 0 0    Fall Risk Fall Risk  12/07/2017 12/05/2017 11/07/2017 10/16/2017 06/14/2017  Falls in the past year? No No No No No   Cognitive Function: MMSE - Mini Mental State Exam 12/07/2017  Orientation to time 5  Orientation to Place 5  Registration 3  Attention/ Calculation 5  Recall 3  Language- name 2 objects 2  Language- repeat 1  Language- follow 3 step command 3  Language- read & follow direction 1  Write a sentence 1  Copy design 1  Total score 30       Screening Tests Health Maintenance  Topic Date Due  . Hepatitis C Screening  06/15/2018 (Originally [birth year])  . HEMOGLOBIN A1C  04/16/2018  . FOOT EXAM  09/29/2018  . OPHTHALMOLOGY EXAM  10/05/2018  . URINE MICROALBUMIN  10/17/2018  . PNA vac Low Risk Adult (2 of 2 - PPSV23) 10/19/2018  . COLONOSCOPY  11/22/2025  . TETANUS/TDAP  05/05/2027  . INFLUENZA VACCINE  Completed      Plan:    End of life planning; Advance aging; Advanced directives discussed. Copy of current HCPOA/Living Will requested upon completion.    I have personally reviewed and noted the following in the patient's chart:   . Medical and social history . Use of alcohol, tobacco or illicit drugs  . Current medications and supplements . Functional ability and status . Nutritional status . Physical activity . Advanced directives . List of other physicians . Hospitalizations, surgeries, and ER visits in previous 12 months . Vitals . Screenings to include cognitive, depression, and falls . Referrals and appointments  In addition, I have reviewed and discussed with patient certain preventive protocols, quality metrics, and best practice recommendations. A written personalized care plan for preventive services as well as general preventive health recommendations were provided to patient.     Varney Biles, LPN   [id number]

## 2017-12-13 ENCOUNTER — Other Ambulatory Visit: Payer: Self-pay | Admitting: Internal Medicine

## 2017-12-13 DIAGNOSIS — M549 Dorsalgia, unspecified: Principal | ICD-10-CM

## 2017-12-13 DIAGNOSIS — G8929 Other chronic pain: Secondary | ICD-10-CM

## 2018-01-08 ENCOUNTER — Other Ambulatory Visit: Payer: Self-pay

## 2018-01-08 ENCOUNTER — Encounter: Payer: Self-pay | Admitting: Nurse Practitioner

## 2018-01-08 ENCOUNTER — Ambulatory Visit: Payer: Medicare Other | Attending: Nurse Practitioner | Admitting: Nurse Practitioner

## 2018-01-08 DIAGNOSIS — Z87891 Personal history of nicotine dependence: Secondary | ICD-10-CM | POA: Diagnosis not present

## 2018-01-08 DIAGNOSIS — G894 Chronic pain syndrome: Secondary | ICD-10-CM | POA: Diagnosis not present

## 2018-01-08 DIAGNOSIS — M5441 Lumbago with sciatica, right side: Secondary | ICD-10-CM | POA: Diagnosis not present

## 2018-01-08 DIAGNOSIS — M79604 Pain in right leg: Secondary | ICD-10-CM

## 2018-01-08 DIAGNOSIS — M533 Sacrococcygeal disorders, not elsewhere classified: Secondary | ICD-10-CM

## 2018-01-08 DIAGNOSIS — Z8249 Family history of ischemic heart disease and other diseases of the circulatory system: Secondary | ICD-10-CM | POA: Insufficient documentation

## 2018-01-08 DIAGNOSIS — N189 Chronic kidney disease, unspecified: Secondary | ICD-10-CM | POA: Diagnosis not present

## 2018-01-08 DIAGNOSIS — G8929 Other chronic pain: Secondary | ICD-10-CM | POA: Insufficient documentation

## 2018-01-08 DIAGNOSIS — E785 Hyperlipidemia, unspecified: Secondary | ICD-10-CM | POA: Diagnosis not present

## 2018-01-08 DIAGNOSIS — I129 Hypertensive chronic kidney disease with stage 1 through stage 4 chronic kidney disease, or unspecified chronic kidney disease: Secondary | ICD-10-CM | POA: Diagnosis not present

## 2018-01-08 DIAGNOSIS — Z789 Other specified health status: Secondary | ICD-10-CM | POA: Diagnosis not present

## 2018-01-08 DIAGNOSIS — M899 Disorder of bone, unspecified: Secondary | ICD-10-CM | POA: Diagnosis not present

## 2018-01-08 DIAGNOSIS — Z79899 Other long term (current) drug therapy: Secondary | ICD-10-CM

## 2018-01-08 DIAGNOSIS — E1122 Type 2 diabetes mellitus with diabetic chronic kidney disease: Secondary | ICD-10-CM | POA: Insufficient documentation

## 2018-01-08 NOTE — Progress Notes (Signed)
Patient's Name: Andrew Gonzalez  MRN: [id number]  Referring Provider: Orland Mustard *  DOB: 09/17/1950  PCP: McLean-Scocuzza, Nino Glow, MD  DOS: 01/08/2018  Note by: Dionisio David NP  Service setting: Ambulatory outpatient  Specialty: Interventional Pain Management  Location: [facility] (AMB) Pain Management Facility    Patient type: New Patient    Primary Reason(s) for Visit: Initial Patient Evaluation CC: Back Pain  HPI  [name] is a 67 y.o. year old, male patient, who comes today for an initial evaluation. He has HTN (hypertension); HLD (hyperlipidemia); DM2 (diabetes mellitus, type 2) ([place]); Colon polyps; Dupuytren contracture; Vitamin D deficiency; Seasonal allergies; Chronic back pain; Allergic rhinitis; Chronic bilateral low back pain with right-sided sciatica (Primary Area of Pain) (R>L); Chronic pain of right lower extremity (Secondary Area of Pain) ; Chronic sacroiliac joint pain; Chronic pain syndrome; Pharmacologic therapy; Disorder of skeletal system; and Problems influencing health status on their problem list.. His primarily concern today is the Back Pain  Pain Assessment: Location: Right, Lower Back Radiating: pain radiaties down both legs, worse on right Onset: More than a month ago Duration: Chronic pain Quality: Throbbing, Aching, Nagging, Tingling, Constant Severity: 2 /10 (subjective, self-reported pain score)  Note: Reported level is compatible with observation.                          Effect on ADL: limits my activites at times Timing: Constant Modifying factors: exercise, rest, back support BP: (!) 152/74  HR: 85  Onset and Duration: Gradual Cause of pain: Unknown Severity: NAS-11 at its worse: 9/10, NAS-11 at its best: 3/10, NAS-11 now: 5/10 and NAS-11 on the average: 5/10 Timing: During activity or exercise Aggravating Factors: Bending, Kneeling and Lifiting Alleviating Factors: Nerve blocks Associated Problems: Numbness, Spasms, Tingling and Pain that wakes  patient up Quality of Pain: Sharp, Shooting and Tingling Previous Examinations or Tests: MRI scan, Nerve block and X-rays Previous Treatments: Epidural steroid injections and Trigger point injections  The patient comes into the clinics today for the first time for a chronic pain management evaluation.  According to the patient his primary area is in his lower back.  He denies any previous surgery.  He admits that in February 2019 he did have a nerve block by Dr. Princella Ion in [place].  He admits that this was effective.  He have trigger point injections in the past which were not effective.  He does do home stretching exercises denies any previous physical therapy.  He admits that he did have some images in 2018 while in [place].  His next area of pain is in his legs.  He admits the right is greater than the left.  He admits that on the right side he has tingling that goes down the side of his leg to the bottom of his feet.  He admits that on the left the pain seems to be just above the hip area.  He denies numbness or weakness.  He denies a nerve conduction study.  Today I took the time to provide the patient with information regarding this pain practice. The patient was informed that the practice is divided into two sections: an interventional pain management section, as well as a completely separate and distinct medication management section. I explained that there are procedure days for interventional therapies, and evaluation days for follow-ups and medication management. Because of the amount of documentation required during both, they are kept separated. This means that there is the  possibility that he may be scheduled for a procedure on one day, and medication management the next. I have also informed him that because of staffing and facility limitations, this practice will no longer take patients for medication management only. To illustrate the reasons for this, I gave the patient the example of  surgeons, and how inappropriate it would be to refer a patient to his/her care, just to write for the post-surgical antibiotics on a surgery done by a different surgeon.   Because interventional pain management is part of the board-certified specialty for the doctors, the patient was informed that joining this practice means that they are open to any and all interventional therapies. I made it clear that this does not mean that they will be forced to have any procedures done. What this means is that I believe interventional therapies to be essential part of the diagnosis and proper management of chronic pain conditions. Therefore, patients not interested in these interventional alternatives will be better served under the care of a different practitioner.  The patient was also made aware of my Comprehensive Pain Management Safety Guidelines where by joining this practice, they limit all of their nerve blocks and joint injections to those done by our practice, for as long as we are retained to manage their care. Historic Controlled Substance Pharmacotherapy Review  PMP and historical list of controlled substances: [place] resident no PMP available.  Medications: The patient did not bring the medication(s) to the appointment, as requested in our "New Patient Package" Pharmacodynamics: Desired effects: Analgesia: The patient reports >50% benefit. Reported improvement in function: The patient reports medication allows him to accomplish basic ADLs. Clinically meaningful improvement in function (CMIF): Sustained CMIF goals met Perceived effectiveness: Described as relatively effective, allowing for increase in activities of daily living (ADL) Undesirable effects: Side-effects or Adverse reactions: None reported Historical Monitoring: The patient  reports that he does not use drugs. List of all UDS Test(s): No results found for: MDMA, COCAINSCRNUR, PCPSCRNUR, PCPQUANT, CANNABQUANT, THCU, [place] List of  all Serum Drug Screening Test(s):  No results found for: AMPHSCRSER, BARBSCRSER, BENZOSCRSER, COCAINSCRSER, PCPSCRSER, PCPQUANT, THCSCRSER, CANNABQUANT, OPIATESCRSER, OXYSCRSER, PROPOXSCRSER Historical Background Evaluation: [place] PDMP: Six (6) year initial data search conducted.             [place] Department of public safety, offender search: Editor, commissioning Information) Non-contributory Risk Assessment Profile: Aberrant behavior: None observed or detected today Risk factors for fatal opioid overdose: None identified today Fatal overdose hazard ratio (HR): Calculation deferred Non-fatal overdose hazard ratio (HR): Calculation deferred Risk of opioid abuse or dependence: 0.7-3.0% with doses ? 36 MME/day and 6.1-26% with doses ? 120 MME/day. Substance use disorder (SUD) risk level: Pending results of Medical Psychology Evaluation for SUD Opioid risk tool (ORT) (Total Score): 0  ORT Scoring interpretation table:  Score <3 = Low Risk for SUD  Score between 4-7 = Moderate Risk for SUD  Score >8 = High Risk for Opioid Abuse   PHQ-2 Depression Scale:  Total score:    PHQ-2 Scoring interpretation table: (Score and probability of major depressive disorder)  Score 0 = No depression  Score 1 = 15.4% Probability  Score 2 = 21.1% Probability  Score 3 = 38.4% Probability  Score 4 = 45.5% Probability  Score 5 = 56.4% Probability  Score 6 = 78.6% Probability   PHQ-9 Depression Scale:  Total score:    PHQ-9 Scoring interpretation table:  Score 0-4 = No depression  Score 5-9 = Mild depression  Score  10-14 = Moderate depression  Score 15-19 = Moderately severe depression  Score 20-27 = Severe depression (2.4 times higher risk of SUD and 2.89 times higher risk of overuse)   Pharmacologic Plan: Pending ordered tests and/or consults  Meds  The patient has a current medication list which includes the following prescription(s): amlodipine, aspirin ec, atorvastatin, cetirizine, vitamin d-3, cinnamon, glucose  blood, metformin, multivitamin, and sitagliptin.  Current Outpatient Medications on File Prior to Visit  Medication Sig  . amLODipine (NORVASC) 2.5 MG tablet Take 1 tablet (2.5 mg total) by mouth daily.  Marland Kitchen aspirin EC 81 MG tablet Take 1 tablet (81 mg total) by mouth daily.  Marland Kitchen atorvastatin (LIPITOR) 20 MG tablet Take 1 tablet (20 mg total) by mouth daily at 6 PM.  . cetirizine (ZYRTEC) 10 MG tablet Take 1 tablet (10 mg total) by mouth daily.  . Cholecalciferol (VITAMIN D-3) 1000 units CAPS Take by mouth.  . Cinnamon 500 MG TABS Take by mouth.  Marland Kitchen glucose blood (FREESTYLE LITE) test strip Use as instructed qd to bid E11.9  . metFORMIN (GLUCOPHAGE) 1000 MG tablet Take 1 tablet (1,000 mg total) by mouth 2 (two) times daily with a meal.  . Multiple Vitamin (MULTIVITAMIN) tablet Take 1 tablet by mouth daily.  . sitaGLIPtin (JANUVIA) 100 MG tablet Take 1 tablet (100 mg total) by mouth daily.   No current facility-administered medications on file prior to visit.    Imaging Review  Note: No new results found.        ROS  Cardiovascular History: High blood pressure  Pulmonary or Respiratory History: No reported pulmonary signs or symptoms such as wheezing and difficulty taking a deep full breath (Asthma), difficulty blowing air out (Emphysema), coughing up mucus (Bronchitis), persistent dry cough, or temporary stoppage of breathing during sleep Neurological History: No reported neurological signs or symptoms such as seizures, abnormal skin sensations, urinary and/or fecal incontinence, being born with an abnormal open spine and/or a tethered spinal cord Review of Past Neurological Studies: No results found for this or any previous visit. Psychological-Psychiatric History: No reported psychological or psychiatric signs or symptoms such as difficulty sleeping, anxiety, depression, delusions or hallucinations (schizophrenial), mood swings (bipolar disorders) or suicidal ideations or  attempts Gastrointestinal History: No reported gastrointestinal signs or symptoms such as vomiting or evacuating blood, reflux, heartburn, alternating episodes of diarrhea and constipation, inflamed or scarred liver, or pancreas or irrregular and/or infrequent bowel movements Genitourinary History: No reported renal or genitourinary signs or symptoms such as difficulty voiding or producing urine, peeing blood, non-functioning kidney, kidney stones, difficulty emptying the bladder, difficulty controlling the flow of urine, or chronic kidney disease Hematological History: No reported hematological signs or symptoms such as prolonged bleeding, low or poor functioning platelets, bruising or bleeding easily, hereditary bleeding problems, low energy levels due to low hemoglobin or being anemic Endocrine History: High blood sugar requiring insulin (IDDM) Rheumatologic History: No reported rheumatological signs and symptoms such as fatigue, joint pain, tenderness, swelling, redness, heat, stiffness, decreased range of motion, with or without associated rash Musculoskeletal History: Negative for myasthenia gravis, muscular dystrophy, multiple sclerosis or malignant hyperthermia Work History: Retired  Allergies  Mr. Viscomi is allergic to biofreeze [menthol (topical analgesic)].  Laboratory Chemistry  Inflammation Markers No results found for: CRP, ESRSEDRATE (CRP: Acute Phase) (ESR: Chronic Phase) Renal Function Markers Lab Results  Component Value Date   BUN 17 10/17/2017   CREATININE 1.06 10/17/2017   Hepatic Function Markers Lab Results  Component Value Date  AST 14 10/17/2017   ALT 15 10/17/2017   ALBUMIN 4.3 10/17/2017   ALKPHOS 62 10/17/2017   Electrolytes Lab Results  Component Value Date   NA 139 10/17/2017   K 4.3 10/17/2017   CL 104 10/17/2017   CALCIUM 9.2 10/17/2017   Neuropathy Markers No results found for: [id number] Bone Pathology Markers Lab Results  Component Value  Date   ALKPHOS 62 10/17/2017   VD25OH 27.75 (L) 10/16/2017   CALCIUM 9.2 10/17/2017   Coagulation Parameters Lab Results  Component Value Date   PLT 266.0 10/16/2017   Cardiovascular Markers Lab Results  Component Value Date   HGB 13.2 10/16/2017   HCT 38.1 (L) 10/16/2017   Note: Lab results reviewed.  [place]  Drug: Mr. Dennard  reports that he does not use drugs. Alcohol:  has no alcohol history on file. Tobacco:  reports that he has quit smoking. He has never used smokeless tobacco. Medical:  has a past medical history of Allergy, Chicken pox, Colon polyps, Diabetes mellitus without complication ([place]), Hyperlipidemia, and Hypertension. Family: family history includes Atrial fibrillation in his sister; Cancer in his mother; Diabetes in his mother; Hypertension in his mother.  Past Surgical History:  Procedure Laterality Date  . TONSILLECTOMY     Active Ambulatory Problems    Diagnosis Date Noted  . HTN (hypertension) 04/14/2017  . HLD (hyperlipidemia) 04/14/2017  . DM2 (diabetes mellitus, type 2) ([place]) 04/14/2017  . Colon polyps 04/14/2017  . Dupuytren contracture 04/14/2017  . Vitamin D deficiency 04/14/2017  . Seasonal allergies 04/14/2017  . Chronic back pain 04/14/2017  . Allergic rhinitis 10/16/2017  . Chronic bilateral low back pain with right-sided sciatica (Primary Area of Pain) (R>L) 01/08/2018  . Chronic pain of right lower extremity (Secondary Area of Pain)  01/08/2018  . Chronic sacroiliac joint pain 01/08/2018  . Chronic pain syndrome 01/08/2018  . Pharmacologic therapy 01/08/2018  . Disorder of skeletal system 01/08/2018  . Problems influencing health status 01/08/2018   Resolved Ambulatory Problems    Diagnosis Date Noted  . No Resolved Ambulatory Problems   Past Medical History:  Diagnosis Date  . Allergy   . Chicken pox   . Diabetes mellitus without complication ([place])   . Hyperlipidemia   . Hypertension    Constitutional Exam  General  appearance: Well nourished, well developed, and well hydrated. In no apparent acute distress Vitals:   01/08/18 0951  BP: (!) 152/74  Pulse: 85  Temp: 97.9 F (36.6 C)  SpO2: 100%  Weight: 171 lb (77.6 kg)  Height: 5' 9" (1.753 m)   BMI Assessment: Estimated body mass index is 25.25 kg/m as calculated from the following:   Height as of this encounter: 5' 9" (1.753 m).   Weight as of this encounter: 171 lb (77.6 kg).  BMI interpretation table: BMI level Category Range association with higher incidence of chronic pain  <18 kg/m2 Underweight   18.5-24.9 kg/m2 Ideal body weight   25-29.9 kg/m2 Overweight Increased incidence by 20%  30-34.9 kg/m2 Obese (Class I) Increased incidence by 68%  35-39.9 kg/m2 Severe obesity (Class II) Increased incidence by 136%  >40 kg/m2 Extreme obesity (Class III) Increased incidence by 254%   BMI Readings from Last 4 Encounters:  01/08/18 25.25 kg/m  12/07/17 25.67 kg/m  12/05/17 25.46 kg/m  11/08/17 26.14 kg/m   Wt Readings from Last 4 Encounters:  01/08/18 171 lb (77.6 kg)  12/07/17 173 lb 12.8 oz (78.8 kg)  12/05/17 172 lb 6.4 oz (78.2  kg)  11/08/17 177 lb (80.3 kg)  Psych/Mental status: Alert, oriented x 3 (person, place, & time)       Eyes: PERLA Respiratory: No evidence of acute respiratory distress  Lumbar Spine Exam  Inspection: No masses, redness, or swelling Alignment: Symmetrical Functional ROM: Unrestricted ROM      Stability: No instability detected Muscle strength & Tone: Functionally intact Sensory: Unimpaired Palpation: Non-tender       Provocative Tests: Lumbar Hyperextension and rotation test: Positive bilaterally for facet joint pain. Patrick's Maneuver: Positive for right-sided S-I arthralgia              Gait & Posture Assessment  Ambulation: Unassisted Gait: Relatively normal for age and body habitus Posture: WNL   Lower Extremity Exam    Side: Right lower extremity  Side: Left lower extremity  Inspection:  No masses, redness, swelling, or asymmetry. No contractures  Inspection: No masses, redness, swelling, or asymmetry. No contractures  Functional ROM: Unrestricted ROM          Functional ROM: Unrestricted ROM          Muscle strength & Tone: Able to Toe-walk & Heel-walk without problems  Muscle strength & Tone: Able to Toe-walk & Heel-walk without problems  Sensory: Unimpaired  Sensory: Unimpaired  Palpation: No palpable anomalies  Palpation: No palpable anomalies   Assessment  Primary Diagnosis & Pertinent Problem List: Diagnoses of Chronic bilateral low back pain with right-sided sciatica (Primary Area of Pain) (R>L), Chronic pain of right lower extremity (Secondary Area of Pain) , Chronic sacroiliac joint pain, Chronic pain syndrome, Pharmacologic therapy, Disorder of skeletal system, and Problems influencing health status were pertinent to this visit.  Visit Diagnosis: 1. Chronic bilateral low back pain with right-sided sciatica (Primary Area of Pain) (R>L)   2. Chronic pain of right lower extremity (Secondary Area of Pain)    3. Chronic sacroiliac joint pain   4. Chronic pain syndrome   5. Pharmacologic therapy   6. Disorder of skeletal system   7. Problems influencing health status    Plan of Care  Initial treatment plan:  Please be advised that as per protocol, today's visit has been an evaluation only. We have not taken over the patient's controlled substance management.  Problem-specific plan: No problem-specific Assessment & Plan notes found for this encounter.  Ordered Lab-work, Procedure(s), Referral(s), & Consult(s): Orders Placed This Encounter  Procedures  . DG Lumbar Spine Complete W/Bend  . DG Si Joints  . Compliance Drug Analysis, Ur  . Magnesium  . Vitamin B12  . Sedimentation rate  . C-reactive protein   Pharmacotherapy: Medications ordered:  No orders of the defined types were placed in this encounter.  Medications administered during this visit: Govind Furey had no medications administered during this visit.   Pharmacotherapy under consideration:  Opioid Analgesics: The patient was informed that there is no guarantee that he would be a candidate for opioid analgesics. The decision will be made following CDC guidelines. This decision will be based on the results of diagnostic studies, as well as Mr. Turpen's risk profile.  Membrane stabilizer: To be determined at a later time Muscle relaxant: To be determined at a later time NSAID: To be determined at a later time Other analgesic(s): To be determined at a later time   Interventional therapies under consideration: Mr. Huitron was informed that there is no guarantee that he would be a candidate for interventional therapies. The decision will be based on the results of diagnostic  studies, as well as Mr. Siegenthaler's risk profile.  Possible procedure(s): Diagnostic right lumbar epidural steroid injection Diagnostic bilateral lumbar facet nerve block Possible bilateral lumbar facet radiofrequency ablation Diagnostic right sacroiliac joint injection Possible right sacroiliac joint radiofrequency ablation   Provider-requested follow-up: Return for 2nd Visit, w/ Dr. Dossie Arbour.  Future Appointments  Date Time Provider [place]  01/22/2018  9:00 AM Milinda Pointer, MD [facility] None  02/13/2018  9:00 AM Georgina Peer, RD [facility] None  02/15/2018  9:30 AM McLean-Scocuzza, Nino Glow, MD [facility]  12/11/2018 10:30 AM O'Brien-Blaney, Bryson Corona, LPN [facility]  01/0/9323 11:00 AM McLean-Scocuzza, Nino Glow, MD [facility]    Primary Care Physician: McLean-Scocuzza, Nino Glow, MD Location: [facility] Note by:  Date: 01/08/2018; Time: 1:49 PM  Pain Score Disclaimer: We use the NRS-11 scale. This is a self-reported, subjective measurement of pain severity with only modest accuracy. It is used primarily to identify changes within a particular patient. It must be  understood that outpatient pain scales are significantly less accurate that those used for research, where they can be applied under ideal controlled circumstances with minimal exposure to variables. In reality, the score is likely to be a combination of pain intensity and pain affect, where pain affect describes the degree of emotional arousal or changes in action readiness caused by the sensory experience of pain. Factors such as social and work situation, setting, emotional state, anxiety levels, expectation, and prior pain experience may influence pain perception and show large inter-individual differences that may also be affected by time variables.  Patient instructions provided during this appointment: Patient Instructions   ____________________________________________________________________________________________  Appointment Policy Summary  It is our goal and responsibility to provide the medical community with assistance in the evaluation and management of patients with chronic pain. Unfortunately our resources are limited. Because we do not have an unlimited amount of time, or available appointments, we are required to closely monitor and manage their use. The following rules exist to maximize their use:  Patient's responsibilities: 1. Punctuality:  At what time should I arrive? You should be physically present in our office 30 minutes before your scheduled appointment. Your scheduled appointment is with your assigned healthcare provider. However, it takes 5-10 minutes to be "checked-in", and another 15 minutes for the nurses to do the admission. If you arrive to our office at the time you were given for your appointment, you will end up being at least 20-25 minutes late to your appointment with the provider. 2. Tardiness:  What happens if I arrive only a few minutes after my scheduled appointment time? You will need to reschedule your appointment. The cutoff is your appointment time.  This is why it is so important that you arrive at least 30 minutes before that appointment. If you have an appointment scheduled for 10:00 AM and you arrive at 10:01, you will be required to reschedule your appointment.  3. Plan ahead:  Always assume that you will encounter traffic on your way in. Plan for it. If you are dependent on a driver, make sure they understand these rules and the need to arrive early. 4. Other appointments and responsibilities:  Avoid scheduling any other appointments before or after your pain clinic appointments.  5. Be prepared:  Write down everything that you need to discuss with your healthcare provider and give this information to the admitting nurse. Write down the medications that you will need refilled. Bring your pills and bottles (even the  empty ones), to all of your appointments, except for those where a procedure is scheduled. 6. No children or pets:  Find someone to take care of them. It is not appropriate to bring them in. 7. Scheduling changes:  We request "advanced notification" of any changes or cancellations. 8. Advanced notification:  Defined as a time period of more than 24 hours prior to the originally scheduled appointment. This allows for the appointment to be offered to other patients. 9. Rescheduling:  When a visit is rescheduled, it will require the cancellation of the original appointment. For this reason they both fall within the category of "Cancellations".  10. Cancellations:  They require advanced notification. Any cancellation less than 24 hours before the  appointment will be recorded as a "No Show". 11. No Show:  Defined as an unkept appointment where the patient failed to notify or declare to the practice their intention or inability to keep the appointment.  Corrective process for repeat offenders:  1. Tardiness: Three (3) episodes of rescheduling due to late arrivals will be recorded as one (1) "No Show". 2. Cancellation or  reschedule: Three (3) cancellations or rescheduling will be recorded as one (1) "No Show". 3. "No Shows": Three (3) "No Shows" within a 12 month period will result in discharge from the practice. ____________________________________________________________________________________________   ______________________________________________________________________________________________  Specialty Pain Scale  Introduction:  There are significant differences in how pain is reported. The word pain usually refers to physical pain, but it is also a common synonym of suffering. The medical community uses a scale from 0 (zero) to 10 (ten) to report pain level. Zero (0) is described as "no pain", while ten (10) is described as "the worse pain you can imagine". The problem with this scale is that physical pain is reported along with suffering. Suffering refers to mental pain, or more often yet it refers to any unpleasant feeling, emotion or aversion associated with the perception of harm or threat of harm. It is the psychological component of pain.  Pain Specialists prefer to separate the two components. The pain scale used by this practice is the Verbal Numerical Rating Scale (VNRS-11). This scale is for the physical pain only. DO NOT INCLUDE how your pain psychologically affects you. This scale is for adults 84 years of age and older. It has 11 (eleven) levels. The 1st level is 0/10. This means: "right now, I have no pain". In the context of pain management, it also means: "right now, my physical pain is under control with the current therapy".  General Information:  The scale should reflect your current level of pain. Unless you are specifically asked for the level of your worst pain, or your average pain. If you are asked for one of these two, then it should be understood that it is over the past 24 hours.  Levels 1 (one) through 5 (five) are described below, and can be treated as an outpatient. Ambulatory  pain management facilities such as ours are more than adequate to treat these levels. Levels 6 (six) through 10 (ten) are also described below, however, these must be treated as a hospitalized patient. While levels 6 (six) and 7 (seven) may be evaluated at an urgent care facility, levels 8 (eight) through 10 (ten) constitute medical emergencies and as such, they belong in a hospital's emergency department. When having these levels (as described below), do not come to our office. Our facility is not equipped to manage these levels. Go directly to an urgent care  facility or an emergency department to be evaluated.  Definitions:  Activities of Daily Living (ADL): Activities of daily living (ADL or ADLs) is a term used in healthcare to refer to people's daily self-care activities. Health professionals often use a person's ability or inability to perform ADLs as a measurement of their functional status, particularly in regard to people post injury, with disabilities and the elderly. There are two ADL levels: Basic and Instrumental. Basic Activities of Daily Living (BADL  or BADLs) consist of self-care tasks that include: Bathing and showering; personal hygiene and grooming (including brushing/combing/styling hair); dressing; Toilet hygiene (getting to the toilet, cleaning oneself, and getting back up); eating and self-feeding (not including cooking or chewing and swallowing); functional mobility, often referred to as "transferring", as measured by the ability to walk, get in and out of bed, and get into and out of a chair; the broader definition (moving from one place to another while performing activities) is useful for people with different physical abilities who are still able to get around independently. Basic ADLs include the things many people do when they get up in the morning and get ready to go out of the house: get out of bed, go to the toilet, bathe, dress, groom, and eat. On the average, loss of  function typically follows a particular order. Hygiene is the first to go, followed by loss of toilet use and locomotion. The last to go is the ability to eat. When there is only one remaining area in which the person is independent, there is a 62.9% chance that it is eating and only a 3.5% chance that it is hygiene. Instrumental Activities of Daily Living (IADL or IADLs) are not necessary for fundamental functioning, but they let an individual live independently in a community. IADL consist of tasks that include: cleaning and maintaining the house; home establishment and maintenance; care of others (including selecting and supervising caregivers); care of pets; child rearing; managing money; managing financials (investments, etc.); meal preparation and cleanup; shopping for groceries and necessities; moving within the community; safety procedures and emergency responses; health management and maintenance (taking prescribed medications); and using the telephone or other form of communication.  Instructions:  Most patients tend to report their pain as a combination of two factors, their physical pain and their psychosocial pain. This last one is also known as "suffering" and it is reflection of how physical pain affects you socially and psychologically. From now on, report them separately.  From this point on, when asked to report your pain level, report only your physical pain. Use the following table for reference.  Pain Clinic Pain Levels (0-5/10)  Pain Level Score  Description  No Pain 0   Mild pain 1 Nagging, annoying, but does not interfere with basic activities of daily living (ADL). Patients are able to eat, bathe, get dressed, toileting (being able to get on and off the toilet and perform personal hygiene functions), transfer (move in and out of bed or a chair without assistance), and maintain continence (able to control bladder and bowel functions). Blood pressure and heart rate are unaffected. A  normal heart rate for a healthy adult ranges from 60 to 100 bpm (beats per minute).   Mild to moderate pain 2 Noticeable and distracting. Impossible to hide from other people. More frequent flare-ups. Still possible to adapt and function close to normal. It can be very annoying and may have occasional stronger flare-ups. With discipline, patients may get used to it and adapt.  Moderate pain 3 Interferes significantly with activities of daily living (ADL). It becomes difficult to feed, bathe, get dressed, get on and off the toilet or to perform personal hygiene functions. Difficult to get in and out of bed or a chair without assistance. Very distracting. With effort, it can be ignored when deeply involved in activities.   Moderately severe pain 4 Impossible to ignore for more than a few minutes. With effort, patients may still be able to manage work or participate in some social activities. Very difficult to concentrate. Signs of autonomic nervous system discharge are evident: dilated pupils (mydriasis); mild sweating (diaphoresis); sleep interference. Heart rate becomes elevated (>115 bpm). Diastolic blood pressure (lower number) rises above 100 mmHg. Patients find relief in laying down and not moving.   Severe pain 5 Intense and extremely unpleasant. Associated with frowning face and frequent crying. Pain overwhelms the senses.  Ability to do any activity or maintain social relationships becomes significantly limited. Conversation becomes difficult. Pacing back and forth is common, as getting into a comfortable position is nearly impossible. Pain wakes you up from deep sleep. Physical signs will be obvious: pupillary dilation; increased sweating; goosebumps; brisk reflexes; cold, clammy hands and feet; nausea, vomiting or dry heaves; loss of appetite; significant sleep disturbance with inability to fall asleep or to remain asleep. When persistent, significant weight loss is observed due to the complete  loss of appetite and sleep deprivation.  Blood pressure and heart rate becomes significantly elevated. Caution: If elevated blood pressure triggers a pounding headache associated with blurred vision, then the patient should immediately seek attention at an urgent or emergency care unit, as these may be signs of an impending stroke.    Emergency Department Pain Levels (6-10/10)  Emergency Room Pain 6 Severely limiting. Requires emergency care and should not be seen or managed at an outpatient pain management facility. Communication becomes difficult and requires great effort. Assistance to reach the emergency department may be required. Facial flushing and profuse sweating along with potentially dangerous increases in heart rate and blood pressure will be evident.   Distressing pain 7 Self-care is very difficult. Assistance is required to transport, or use restroom. Assistance to reach the emergency department will be required. Tasks requiring coordination, such as bathing and getting dressed become very difficult.   Disabling pain 8 Self-care is no longer possible. At this level, pain is disabling. The individual is unable to do even the most "basic" activities such as walking, eating, bathing, dressing, transferring to a bed, or toileting. Fine motor skills are lost. It is difficult to think clearly.   Incapacitating pain 9 Pain becomes incapacitating. Thought processing is no longer possible. Difficult to remember your own name. Control of movement and coordination are lost.   The worst pain imaginable 10 At this level, most patients pass out from pain. When this level is reached, collapse of the autonomic nervous system occurs, leading to a sudden drop in blood pressure and heart rate. This in turn results in a temporary and dramatic drop in blood flow to the brain, leading to a loss of consciousness. Fainting is one of the body's self defense mechanisms. Passing out puts the brain in a calmed state  and causes it to shut down for a while, in order to begin the healing process.    Summary: 1. Refer to this scale when providing [place] with your pain level. 2. Be accurate and careful when reporting your pain level. This will help with your care. 3.  Over-reporting your pain level will lead to loss of credibility. 4. Even a level of 1/10 means that there is pain and will be treated at our facility. 5. High, inaccurate reporting will be documented as "Symptom Exaggeration", leading to loss of credibility and suspicions of possible secondary gains such as obtaining more narcotics, or wanting to appear disabled, for fraudulent reasons. 6. Only pain levels of 5 or below will be seen at our facility. 7. Pain levels of 6 and above will be sent to the Emergency Department and the appointment cancelled. ______________________________________________________________________________________________

## 2018-01-08 NOTE — Patient Instructions (Signed)

## 2018-01-09 LAB — VITAMIN B12: VITAMIN B 12: 469 pg/mL (ref 232–1245)

## 2018-01-09 LAB — C-REACTIVE PROTEIN: CRP: 3 mg/L (ref 0–10)

## 2018-01-09 LAB — SEDIMENTATION RATE: Sed Rate: 8 mm/hr (ref 0–30)

## 2018-01-09 LAB — MAGNESIUM: Magnesium: 1.4 mg/dL — ABNORMAL LOW (ref 1.6–2.3)

## 2018-01-11 LAB — COMPLIANCE DRUG ANALYSIS, UR

## 2018-01-16 ENCOUNTER — Ambulatory Visit
Admission: RE | Admit: 2018-01-16 | Discharge: 2018-01-16 | Disposition: A | Discharge status: Home / Self Care | Payer: Medicare Other | Source: Ambulatory Visit | Attending: Nurse Practitioner | Admitting: Nurse Practitioner

## 2018-01-16 DIAGNOSIS — M533 Sacrococcygeal disorders, not elsewhere classified: Secondary | ICD-10-CM

## 2018-01-16 DIAGNOSIS — M5441 Lumbago with sciatica, right side: Secondary | ICD-10-CM | POA: Insufficient documentation

## 2018-01-16 DIAGNOSIS — G8929 Other chronic pain: Secondary | ICD-10-CM | POA: Insufficient documentation

## 2018-01-16 DIAGNOSIS — M549 Dorsalgia, unspecified: Secondary | ICD-10-CM | POA: Diagnosis not present

## 2018-01-16 DIAGNOSIS — E1140 Type 2 diabetes mellitus with diabetic neuropathy, unspecified: Secondary | ICD-10-CM | POA: Diagnosis not present

## 2018-01-16 DIAGNOSIS — B351 Tinea unguium: Secondary | ICD-10-CM | POA: Diagnosis not present

## 2018-01-17 NOTE — Progress Notes (Signed)
Results were reviewed and found to be: mildly abnormal  No acute injury or pathology identified  Review would suggest interventional pain management techniques may be of benefit 

## 2018-01-21 NOTE — Progress Notes (Signed)
Patient's Name: Andrew Gonzalez  MRN: [id number]  Referring Provider: Orland Mustard *  DOB: 02/14/1950  PCP: McLean-Scocuzza, Nino Glow, MD  DOS: 01/22/2018  Note by: Gaspar Cola, MD  Service setting: Ambulatory outpatient  Specialty: Interventional Pain Management  Location: [facility] (AMB) Pain Management Facility    Patient type: Established   Primary Reason(s) for Visit: Encounter for evaluation before starting new chronic pain management plan of care (Level of risk: moderate) CC: Back Pain (low) and Leg Pain (right is worse)  HPI  Andrew Gonzalez is a 67 y.o. year old, male patient, who comes today for a follow-up evaluation to review the test results and decide on a treatment plan. He has HTN (hypertension); HLD (hyperlipidemia); DM2 (diabetes mellitus, type 2) ([place]); Colon polyps; Dupuytren contracture; Vitamin D insufficiency; Seasonal allergies; Allergic rhinitis; Chronic low back pain (Primary Area of Pain) (Bilateral) (R>L) w/ sciatica (Right); Chronic lower extremity pain (Secondary Area of Pain) (Right); Chronic sacroiliac joint pain (Bilateral) (R>L); Chronic pain syndrome; Pharmacologic therapy; Disorder of skeletal system; Problems influencing health status; Hypomagnesemia; Lumbar facet joint syndrome (Bilateral) (R>L); Chronic hip pain (Bilateral); DDD (degenerative disc disease), lumbosacral; Lumbar spondylosis; Lumbosacral Grade 1 Anterolisthesis of L5/S1; and L5 Pars defect (Bilateral) w/ spondylolisthesis on their problem list. His primarily concern today is the Back Pain (low) and Leg Pain (right is worse)  Pain Assessment: Location:   Back Onset: More than a month ago Duration: Chronic pain Quality: Constant, Throbbing, Tingling, Nagging, Aching Severity: 4 /10 (subjective, self-reported pain score)  Note: Reported level is inconsistent with clinical observations. Clinically the patient looks like a 1/10 A 1/10 is viewed as "Mild" and described as nagging, annoying, but not  interfering with basic activities of daily living (ADL). Andrew Gonzalez is able to eat, bathe, get dressed, do toileting (being able to get on and off the toilet and perform personal hygiene functions), transfer (move in and out of bed or a chair without assistance), and maintain continence (able to control bladder and bowel functions). Physiologic parameters such as blood pressure and heart rate apear wnl. Information on the proper use of the pain scale provided to the patient today. When using our objective Pain Scale, levels between 6 and 10/10 are said to belong in an emergency room, as it progressively worsens from a 6/10, described as severely limiting, requiring emergency care not usually available at an outpatient pain management facility. At a 6/10 level, communication becomes difficult and requires great effort. Assistance to reach the emergency department may be required. Facial flushing and profuse sweating along with potentially dangerous increases in heart rate and blood pressure will be evident. Timing: Constant Modifying factors: exercise, rest, back support BP: 134/72  HR: 78  Andrew Gonzalez comes in today for a follow-up visit after his initial evaluation on 01/08/2018. Today we went over the results of his tests. These were explained in "Layman's terms". During today's appointment we went over my diagnostic impression, as well as the proposed treatment plan.  According to the patient his primary area is in his lower back (B) (R>L).  He denies any previous surgery.  He admits that in February 2019 he did have a nerve block by Dr. Princella Ion in [place].  He admits that this was effective.  He have trigger point injections in the past which were not effective.  He does do home stretching exercises denies any previous physical therapy.  He admits that he did have some images in 2018 while in [place].  His next  area of pain is in his legs (B) (R>L).  He admits the right is greater than the left.  He  admits that on the right side he has tingling that goes down the side of his leg to the bottom of his feet.  He admits that on the left the pain seems to be just above the hip area.  He denies numbness or weakness.  He denies a nerve conduction study. RLEP - Down the lateral aspect of the leg to the ankle. LLEP - down to the hip.  In considering the treatment plan options, Mr. Nurse was reminded that I no longer take patients for medication management only. I asked him to let me know if he had no intention of taking advantage of the interventional therapies, so that we could make arrangements to provide this space to someone interested. I also made it clear that undergoing interventional therapies for the purpose of getting pain medications is very inappropriate on the part of a patient, and it will not be tolerated in this practice. This type of behavior would suggest true addiction and therefore it requires referral to an addiction specialist.   Further details on both, my assessment(s), as well as the proposed treatment plan, please see below.  Controlled Substance Pharmacotherapy Assessment REMS (Risk Evaluation and Mitigation Strategy)  Analgesic: None at this time MME/day: 0 mg/day. Pill Count: None expected due to no prior prescriptions written by our practice. Hart Rochester, RN  01/22/2018  9:01 AM  Sign when Signing Visit Safety precautions to be maintained throughout the outpatient stay will include: orient to surroundings, keep bed in low position, maintain call bell within reach at all times, provide assistance with transfer out of bed and ambulation.    Pharmacokinetics: Liberation and absorption (onset of action): WNL Distribution (time to peak effect): WNL Metabolism and excretion (duration of action): WNL         Pharmacodynamics: Desired effects: Analgesia: [name] reports >50% benefit. Functional ability: Patient reports that medication allows him to accomplish basic  ADLs Clinically meaningful improvement in function (CMIF): Sustained CMIF goals met Perceived effectiveness: Described as relatively effective, allowing for increase in activities of daily living (ADL) Undesirable effects: Side-effects or Adverse reactions: None reported Monitoring: [place] PMP: Online review of the past 21-monthperiod previously conducted. Not applicable at this point since we have not taken over the patient's medication management yet. List of other Serum/Urine Drug Screening Test(s):  No results found. List of all UDS test(s) done:  Lab Results  Component Value Date   SUMMARY FINAL 01/08/2018   Last UDS on record: Summary  Date Value Ref Range Status  01/08/2018 FINAL  Final    Comment:    ==================================================================== TOXASSURE COMP DRUG ANALYSIS,UR ==================================================================== Test                             Result       Flag       Units Drug Present not Declared for Prescription Verification   Naproxen                       PRESENT      UNEXPECTED Drug Absent but Declared for Prescription Verification   Salicylate                     Not Detected UNEXPECTED    Aspirin, as indicated in the declared medication list,  is not    always detected even when used as directed. ==================================================================== Test                      Result    Flag   Units      Ref Range   Creatinine              130              mg/dL      >=20 ==================================================================== Declared Medications:  The flagging and interpretation on this report are based on the  following declared medications.  Unexpected results may arise from  inaccuracies in the declared medications.  **Note: The testing scope of this panel does not include small to  moderate amounts of these reported medications:  Aspirin (Aspirin 81)  **Note: The testing scope  of this panel does not include following  reported medications:  Amlodipine (Norvasc)  Atorvastatin (Lipitor)  Cetirizine (Zyrtec)  Cinnamon Bark  Metformin  Multivitamin  Sitagliptin (Januvia)  Vitamin D3 ==================================================================== For clinical consultation, please call [phone]. ====================================================================    UDS interpretation: No unexpected findings.          Medication Assessment Form: Patient introduced to form today Treatment compliance: Treatment may start today if patient agrees with proposed plan. Evaluation of compliance is not applicable at this point Risk Assessment Profile: Aberrant behavior: See initial evaluations. None observed or detected today Comorbid factors increasing risk of overdose: See initial evaluation. No additional risks detected today Opioid risk tool (ORT) (Total Score): 0 Personal History of Substance Abuse (SUD-Substance use disorder):  Alcohol: Negative  Illegal Drugs: Negative  Rx Drugs: Negative  ORT Risk Level calculation: Low Risk Risk of substance use disorder (SUD): Low Opioid Risk Tool - 01/22/18 0901      Family History of Substance Abuse   Alcohol  Negative    Illegal Drugs  Negative    Rx Drugs  Negative      Personal History of Substance Abuse   Alcohol  Negative    Illegal Drugs  Negative    Rx Drugs  Negative      Age   Age between 65-45 years   No      History of Preadolescent Sexual Abuse   History of Preadolescent Sexual Abuse  Negative or Male      Psychological Disease   Psychological Disease  Negative    Depression  Negative      Total Score   Opioid Risk Tool Scoring  0    Opioid Risk Interpretation  Low Risk      ORT Scoring interpretation table:  Score <3 = Low Risk for SUD  Score between 4-7 = Moderate Risk for SUD  Score >8 = High Risk for Opioid Abuse   Risk Mitigation Strategies:  Patient opioid safety  counseling: Completed today. Counseling provided to patient as per "Patient Counseling Document". Document signed by patient, attesting to counseling and understanding Patient-Prescriber Agreement (PPA): Obtained today.  Controlled substance notification to other providers: Written and sent today.  Pharmacologic Plan: Today we may be taking over the patient's pharmacological regimen. See below.             Laboratory Chemistry  Inflammation Markers (CRP: Acute Phase) (ESR: Chronic Phase) Lab Results  Component Value Date   CRP 3 01/08/2018   ESRSEDRATE 8 01/08/2018  Rheumatology Markers No results found.  Renal Function Markers Lab Results  Component Value Date   BUN 17 10/17/2017   CREATININE 1.06 10/17/2017                             Hepatic Function Markers Lab Results  Component Value Date   AST 14 10/17/2017   ALT 15 10/17/2017   ALBUMIN 4.3 10/17/2017   ALKPHOS 62 10/17/2017                        Electrolytes Lab Results  Component Value Date   NA 139 10/17/2017   K 4.3 10/17/2017   CL 104 10/17/2017   CALCIUM 9.2 10/17/2017   MG 1.4 (L) 01/08/2018                        Neuropathy Markers Lab Results  Component Value Date   VITAMINB12 469 01/08/2018   HGBA1C 7.2 (H) 10/16/2017                        CNS Tests No results found.  Bone Pathology Markers Lab Results  Component Value Date   VD25OH 27.75 (L) 10/16/2017                         Coagulation Parameters Lab Results  Component Value Date   PLT 266.0 10/16/2017                        Cardiovascular Markers Lab Results  Component Value Date   HGB 13.2 10/16/2017   HCT 38.1 (L) 10/16/2017                         CA Markers No results found.  Note: Lab results reviewed.  Recent Diagnostic Imaging Review  Lumbosacral Imaging: Lumbar DG Bending views:  Results for orders placed during the hospital encounter of 01/16/18  DG Lumbar Spine Complete W/Bend    Narrative CLINICAL DATA:  Chronic low back pain and sciatica. No known injury.  EXAM: LUMBAR SPINE - COMPLETE WITH BENDING VIEWS  COMPARISON:  None.  FINDINGS: Mild convex right curvature with the apex at L2-3 may be positional. The patient has bilateral L5 pars interarticularis defects with 0.9 cm anterolisthesis L5 on S1 which does not change with flexion or extension. Alignment is otherwise normal. Intervertebral disc space height is maintained. Paraspinous structures are unremarkable.  IMPRESSION: Bilateral L5 pars interarticularis defects result in 0.9 cm anterolisthesis L5 on S1. Negative for pathologic motion with flexion or extension.  Mild convex right curvature may be positional.   Electronically Signed   By: Inge Rise M.D.   On: 01/16/2018 15:14    Sacroiliac Joint Imaging: Sacroiliac Joint DG:  Results for orders placed during the hospital encounter of 01/16/18  DG Si Joints   Narrative CLINICAL DATA:  Chronic low back and sacral pain.  No known injury.  EXAM: BILATERAL SACROILIAC JOINTS - 3+ VIEW  COMPARISON:  None.  FINDINGS: The sacroiliac joint spaces are maintained and there is no evidence of arthropathy. No other bone abnormalities are seen.  IMPRESSION: Negative exam.   Electronically Signed   By: Inge Rise M.D.   On: 01/16/2018 15:15    Complexity Note: Imaging results reviewed. Results shared with Mr. Simpson, using  Layman's terms.                         Meds   Current Outpatient Medications:  .  amLODipine (NORVASC) 2.5 MG tablet, Take 1 tablet (2.5 mg total) by mouth daily., Disp: 90 tablet, Rfl: 3 .  aspirin EC 81 MG tablet, Take 1 tablet (81 mg total) by mouth daily., Disp: 90 tablet, Rfl: 3 .  atorvastatin (LIPITOR) 20 MG tablet, Take 1 tablet (20 mg total) by mouth daily at 6 PM., Disp: 90 tablet, Rfl: 3 .  cetirizine (ZYRTEC) 10 MG tablet, Take 1 tablet (10 mg total) by mouth daily., Disp: 90 tablet, Rfl: 3 .   Cinnamon 500 MG TABS, Take by mouth., Disp: , Rfl:  .  glucose blood (FREESTYLE LITE) test strip, Use as instructed qd to bid E11.9, Disp: 180 each, Rfl: 12 .  metFORMIN (GLUCOPHAGE) 1000 MG tablet, Take 1 tablet (1,000 mg total) by mouth 2 (two) times daily with a meal., Disp: 180 tablet, Rfl: 3 .  Multiple Vitamin (MULTIVITAMIN) tablet, Take 1 tablet by mouth daily., Disp: , Rfl:  .  sitaGLIPtin (JANUVIA) 100 MG tablet, Take 1 tablet (100 mg total) by mouth daily., Disp: 90 tablet, Rfl: 3 .  Calcium Carbonate-Vit D-Min (GNP CALCIUM 1200) 1200-1000 MG-UNIT CHEW, Chew 1,200 mg by mouth daily with breakfast. Take in combination with vitamin D and magnesium., Disp: 30 tablet, Rfl: 5 .  Cholecalciferol (VITAMIN D3) 125 MCG (5000 UT) CAPS, Take 1 capsule (5,000 Units total) by mouth daily with breakfast. Take along with calcium and magnesium., Disp: 30 capsule, Rfl: 5 .  ergocalciferol (VITAMIN D2) 1.25 MG (50000 UT) capsule, Take 1 capsule (50,000 Units total) by mouth 2 (two) times a week. X 6 weeks., Disp: 12 capsule, Rfl: 0 .  Magnesium 500 MG CAPS, Take 1 capsule (500 mg total) by mouth 2 (two) times daily at 8 am and 10 pm., Disp: 60 capsule, Rfl: 5  ROS  Constitutional: Denies any fever or chills Gastrointestinal: No reported hemesis, hematochezia, vomiting, or acute GI distress Musculoskeletal: Denies any acute onset joint swelling, redness, loss of ROM, or weakness Neurological: No reported episodes of acute onset apraxia, aphasia, dysarthria, agnosia, amnesia, paralysis, loss of coordination, or loss of consciousness  Allergies  Mr. Ciresi is allergic to biofreeze [menthol (topical analgesic)].  PFSH  Drug: Mr. Emanuelson  reports no history of drug use. Alcohol:  has no history on file for alcohol. Tobacco:  reports that he has quit smoking. He has never used smokeless tobacco. Medical:  has a past medical history of Allergy, Chicken pox, Colon polyps, Diabetes mellitus without complication  ([place]), Hyperlipidemia, and Hypertension. Surgical: Mr. Woolbright  has a past surgical history that includes Tonsillectomy. Family: family history includes Atrial fibrillation in his sister; Cancer in his mother; Diabetes in his mother; Hypertension in his mother.  Constitutional Exam  General appearance: Well nourished, well developed, and well hydrated. In no apparent acute distress Vitals:   01/22/18 0857  BP: 134/72  Pulse: 78  Resp: 16  Temp: (!) 97 F (36.1 C)  TempSrc: Oral  SpO2: 100%  Weight: 169 lb (76.7 kg)  Height: 5' 9"  (1.753 m)   BMI Assessment: Estimated body mass index is 24.96 kg/m as calculated from the following:   Height as of this encounter: 5' 9"  (1.753 m).   Weight as of this encounter: 169 lb (76.7 kg).  BMI interpretation table: BMI level Category Range  association with higher incidence of chronic pain  <18 kg/m2 Underweight   18.5-24.9 kg/m2 Ideal body weight   25-29.9 kg/m2 Overweight Increased incidence by 20%  30-34.9 kg/m2 Obese (Class I) Increased incidence by 68%  35-39.9 kg/m2 Severe obesity (Class II) Increased incidence by 136%  >40 kg/m2 Extreme obesity (Class III) Increased incidence by 254%   Patient's current BMI Ideal Body weight  Body mass index is 24.96 kg/m. Ideal body weight: 70.7 kg (155 lb 13.8 oz) Adjusted ideal body weight: 73.1 kg (161 lb 1.9 oz)   BMI Readings from Last 4 Encounters:  01/22/18 24.96 kg/m  01/08/18 25.25 kg/m  12/07/17 25.67 kg/m  12/05/17 25.46 kg/m   Wt Readings from Last 4 Encounters:  01/22/18 169 lb (76.7 kg)  01/08/18 171 lb (77.6 kg)  12/07/17 173 lb 12.8 oz (78.8 kg)  12/05/17 172 lb 6.4 oz (78.2 kg)  Psych/Mental status: Alert, oriented x 3 (person, place, & time)       Eyes: PERLA Respiratory: No evidence of acute respiratory distress  Cervical Spine Area Exam  Skin & Axial Inspection: No masses, redness, edema, swelling, or associated skin lesions Alignment: Symmetrical Functional ROM:  Unrestricted ROM      Stability: No instability detected Muscle Tone/Strength: Functionally intact. No obvious neuro-muscular anomalies detected. Sensory (Neurological): Unimpaired Palpation: No palpable anomalies              Upper Extremity (UE) Exam    Side: Right upper extremity  Side: Left upper extremity  Skin & Extremity Inspection: Skin color, temperature, and hair growth are WNL. No peripheral edema or cyanosis. No masses, redness, swelling, asymmetry, or associated skin lesions. No contractures.  Skin & Extremity Inspection: Skin color, temperature, and hair growth are WNL. No peripheral edema or cyanosis. No masses, redness, swelling, asymmetry, or associated skin lesions. No contractures.  Functional ROM: Unrestricted ROM          Functional ROM: Unrestricted ROM          Muscle Tone/Strength: Functionally intact. No obvious neuro-muscular anomalies detected.  Muscle Tone/Strength: Functionally intact. No obvious neuro-muscular anomalies detected.  Sensory (Neurological): Unimpaired          Sensory (Neurological): Unimpaired          Palpation: No palpable anomalies              Palpation: No palpable anomalies              Provocative Test(s):  Phalen's test: deferred Tinel's test: deferred Apley's scratch test (touch opposite shoulder):  Action 1 (Across chest): deferred Action 2 (Overhead): deferred Action 3 (LB reach): deferred   Provocative Test(s):  Phalen's test: deferred Tinel's test: deferred Apley's scratch test (touch opposite shoulder):  Action 1 (Across chest): deferred Action 2 (Overhead): deferred Action 3 (LB reach): deferred    Thoracic Spine Area Exam  Skin & Axial Inspection: No masses, redness, or swelling Alignment: Symmetrical Functional ROM: Unrestricted ROM Stability: No instability detected Muscle Tone/Strength: Functionally intact. No obvious neuro-muscular anomalies detected. Sensory (Neurological): Unimpaired Muscle strength & Tone: No  palpable anomalies  Lumbar Spine Area Exam  Skin & Axial Inspection: No masses, redness, or swelling Alignment: Symmetrical Functional ROM: Decreased ROM affecting both sides Stability: No instability detected Muscle Tone/Strength: Increased muscle tone over affected area Sensory (Neurological): Movement-associated pain Palpation: Complains of area being tender to palpation       Provocative Tests: Hyperextension/rotation test: (+) bilaterally for facet joint pain. Lumbar quadrant test (Kemp's test): (+)  bilaterally for facet joint pain. Lateral bending test: deferred today       Patrick's Maneuver: (+) for bilateral S-I arthralgia and for bilateral hip arthralgia FABER* test: (+) for bilateral S-I arthralgia and for bilateral hip arthralgia S-I anterior distraction/compression test: deferred today         S-I lateral compression test: deferred today         S-I Thigh-thrust test: deferred today         S-I Gaenslen's test: deferred today         *(Flexion, ABduction and External Rotation)  Gait & Posture Assessment  Ambulation: Unassisted Gait: Relatively normal for age and body habitus Posture: WNL   Lower Extremity Exam    Side: Right lower extremity  Side: Left lower extremity  Stability: No instability observed          Stability: No instability observed          Skin & Extremity Inspection: Skin color, temperature, and hair growth are WNL. No peripheral edema or cyanosis. No masses, redness, swelling, asymmetry, or associated skin lesions. No contractures.  Skin & Extremity Inspection: Skin color, temperature, and hair growth are WNL. No peripheral edema or cyanosis. No masses, redness, swelling, asymmetry, or associated skin lesions. No contractures.  Functional ROM: Unrestricted ROM for hip and knee joints          Functional ROM: Unrestricted ROM for hip and knee joints          Muscle Tone/Strength: Able to Toe-walk & Heel-walk without problems  Muscle Tone/Strength: Able  to Toe-walk & Heel-walk without problems  Sensory (Neurological): Unimpaired        Sensory (Neurological): Unimpaired        DTR: Patellar: deferred today Achilles: deferred today Plantar: deferred today  DTR: Patellar: deferred today Achilles: deferred today Plantar: deferred today  Palpation: No palpable anomalies  Palpation: No palpable anomalies   Assessment & Plan  Primary Diagnosis & Pertinent Problem List: The primary encounter diagnosis was Chronic pain syndrome. Diagnoses of Chronic low back pain (Primary Area of Pain) (Bilateral) (R>L) w/ sciatica (Right), Chronic lower extremity pain (Secondary Area of Pain) (Right), Chronic sacroiliac joint pain, Vitamin D insufficiency, Hypomagnesemia, Lumbar facet joint syndrome (Bilateral) (R>L), Chronic hip pain (Bilateral), Other intervertebral disc degeneration, lumbar region, DDD (degenerative disc disease), lumbosacral, Lumbar spondylosis, Lumbosacral Grade 1 Anterolisthesis of L5/S1, and L5 Pars defect (Bilateral) w/ spondylolisthesis were also pertinent to this visit.  Visit Diagnosis: 1. Chronic pain syndrome   2. Chronic low back pain (Primary Area of Pain) (Bilateral) (R>L) w/ sciatica (Right)   3. Chronic lower extremity pain (Secondary Area of Pain) (Right)   4. Chronic sacroiliac joint pain   5. Vitamin D insufficiency   6. Hypomagnesemia   7. Lumbar facet joint syndrome (Bilateral) (R>L)   8. Chronic hip pain (Bilateral)   9. Other intervertebral disc degeneration, lumbar region   10. DDD (degenerative disc disease), lumbosacral   11. Lumbar spondylosis   12. Lumbosacral Grade 1 Anterolisthesis of L5/S1   13. L5 Pars defect (Bilateral) w/ spondylolisthesis    Problems updated and reviewed during this visit: Problem  Lumbar facet joint syndrome (Bilateral) (R>L)  Chronic hip pain (Bilateral)  Ddd (Degenerative Disc Disease), Lumbosacral  Lumbar Spondylosis  Lumbosacral Grade 1 Anterolisthesis of L5/S1  L5 Pars defect  (Bilateral) w/ spondylolisthesis  Chronic low back pain (Primary Area of Pain) (Bilateral) (R>L) w/ sciatica (Right)  Chronic lower extremity pain (Secondary Area of Pain) (Right)  Chronic sacroiliac joint pain (Bilateral) (R>L)  Chronic Pain Syndrome  Dupuytren Contracture   R>L   Hypomagnesemia  Pharmacologic Therapy  Disorder of Skeletal System  Problems Influencing Health Status  Vitamin D Insufficiency   10/12/16 vitamin D 43 and 33 02/13/17    Allergic Rhinitis  Htn (Hypertension)  Hld (Hyperlipidemia)  Dm2 (Diabetes Mellitus, Type 2) ([facility])  Colon Polyps  Seasonal Allergies    Plan of Care  Pharmacotherapy (Medications Ordered): Meds ordered this encounter  Medications  . ergocalciferol (VITAMIN D2) 1.25 MG (50000 UT) capsule    Sig: Take 1 capsule (50,000 Units total) by mouth 2 (two) times a week. X 6 weeks.    Dispense:  12 capsule    Refill:  0    Do not add this medication to the electronic "Automatic Refill" notification system. Patient may have prescription filled one day early if pharmacy is closed on scheduled refill date.  . Cholecalciferol (VITAMIN D3) 125 MCG (5000 UT) CAPS    Sig: Take 1 capsule (5,000 Units total) by mouth daily with breakfast. Take along with calcium and magnesium.    Dispense:  30 capsule    Refill:  5    Do not place medication on "Automatic Refill".  May substitute with similar over-the-counter product.  . Magnesium 500 MG CAPS    Sig: Take 1 capsule (500 mg total) by mouth 2 (two) times daily at 8 am and 10 pm.    Dispense:  60 capsule    Refill:  5    Do not place medication on "Automatic Refill".  The patient may use similar over-the-counter product.  . Calcium Carbonate-Vit D-Min (GNP CALCIUM 1200) 1200-1000 MG-UNIT CHEW    Sig: Chew 1,200 mg by mouth daily with breakfast. Take in combination with vitamin D and magnesium.    Dispense:  30 tablet    Refill:  5    Do not place medication on "Automatic Refill".  May substitute with  similar over-the-counter product.   Procedure Orders    No procedure(s) ordered today   Lab Orders  No laboratory test(s) ordered today    Imaging Orders     DG HIP UNILAT W OR W/O PELVIS 2-3 VIEWS RIGHT     DG HIP UNILAT W OR W/O PELVIS 2-3 VIEWS LEFT     MR LUMBAR SPINE WO CONTRAST Referral Orders  No referral(s) requested today    Pharmacological management options:  Opioid Analgesics: I will not be prescribing any opioids at this time Membrane stabilizer: We have discussed the possibility of optimizing this mode of therapy, if tolerated Muscle relaxant: We have discussed the possibility of a trial NSAID: We have discussed the possibility of a trial Other analgesic(s): To be determined at a later time   Interventional management options: Planned, scheduled, and/or pending:    None at this time.  On physical exam, the patient demonstrated having pain in the hip joint as well as the SI joint.  Today we will be ordering x-rays of the hip.  In addition, he demonstrated having problems with the lumbar facets, bilaterally.  He also mentioned having been told that he had spinal stenosis.  We do not have any current studies showing spinal stenosis and therefore today we will be ordering an MRI of the lumbar spine to evaluate the patient's grade 1 anterolisthesis of L5 over S1, to see if there is any spinal stenosis and/or foraminal stenosis at that level.   Considering:   Diagnostic right L5-S1 LESI  Diagnostic right-sided L5 foraminal LESI  Diagnostic caudal ESI + diagnostic epidurogram  Diagnostic bilateral lumbar facet nerve block  Possible bilateral lumbar facet RFA  Diagnostic right sacroiliac joint injection  Possible right sacroiliac joint RFA  Diagnostic bilateral intra-articular hip joint injection  Diagnostic bilateral femoral nerve + obturator nerve block   Possible bilateral femoral nerve + obturator nerve RFA    PRN Procedures:   None at this time    Provider-requested follow-up: Return for F/U eval (after test completion).  Future Appointments  Date Time Provider [place]  02/13/2018  9:00 AM Georgina Peer, RD [facility] None  02/15/2018  9:30 AM McLean-Scocuzza, Nino Glow, MD [facility]  12/11/2018 10:30 AM O'Brien-Blaney, Denisa L, LPN [facility]  40/10/8117 11:00 AM McLean-Scocuzza, Nino Glow, MD [facility]    Primary Care Physician: McLean-Scocuzza, Nino Glow, MD Location: [facility] Note by: Gaspar Cola, MD Date: 01/22/2018; Time: 12:47 PM

## 2018-01-22 ENCOUNTER — Other Ambulatory Visit: Payer: Self-pay

## 2018-01-22 ENCOUNTER — Ambulatory Visit
Admission: RE | Admit: 2018-01-22 | Discharge: 2018-01-22 | Disposition: A | Discharge status: Home / Self Care | Payer: Medicare Other | Source: Ambulatory Visit | Attending: Pain Medicine | Admitting: Pain Medicine

## 2018-01-22 ENCOUNTER — Encounter: Payer: Self-pay | Admitting: Pain Medicine

## 2018-01-22 ENCOUNTER — Telehealth: Payer: Self-pay | Admitting: Pain Medicine

## 2018-01-22 ENCOUNTER — Ambulatory Visit
Admission: RE | Admit: 2018-01-22 | Discharge: 2018-01-22 | Disposition: A | Discharge status: Home / Self Care | Payer: Medicare Other | Attending: Pain Medicine | Admitting: Pain Medicine

## 2018-01-22 ENCOUNTER — Ambulatory Visit (HOSPITAL_BASED_OUTPATIENT_CLINIC_OR_DEPARTMENT_OTHER): Payer: Medicare Other | Admitting: Pain Medicine

## 2018-01-22 VITALS — BP 134/72 | HR 78 | Temp 97.0°F | Resp 16 | Ht 69.0 in | Wt 169.0 lb

## 2018-01-22 DIAGNOSIS — G894 Chronic pain syndrome: Secondary | ICD-10-CM

## 2018-01-22 DIAGNOSIS — M25552 Pain in left hip: Secondary | ICD-10-CM

## 2018-01-22 DIAGNOSIS — I10 Essential (primary) hypertension: Secondary | ICD-10-CM | POA: Insufficient documentation

## 2018-01-22 DIAGNOSIS — M25551 Pain in right hip: Secondary | ICD-10-CM | POA: Insufficient documentation

## 2018-01-22 DIAGNOSIS — M533 Sacrococcygeal disorders, not elsewhere classified: Secondary | ICD-10-CM

## 2018-01-22 DIAGNOSIS — E785 Hyperlipidemia, unspecified: Secondary | ICD-10-CM | POA: Insufficient documentation

## 2018-01-22 DIAGNOSIS — M5441 Lumbago with sciatica, right side: Secondary | ICD-10-CM

## 2018-01-22 DIAGNOSIS — G8929 Other chronic pain: Secondary | ICD-10-CM | POA: Diagnosis not present

## 2018-01-22 DIAGNOSIS — M79604 Pain in right leg: Secondary | ICD-10-CM

## 2018-01-22 DIAGNOSIS — Z7984 Long term (current) use of oral hypoglycemic drugs: Secondary | ICD-10-CM

## 2018-01-22 DIAGNOSIS — M5136 Other intervertebral disc degeneration, lumbar region: Secondary | ICD-10-CM | POA: Diagnosis not present

## 2018-01-22 DIAGNOSIS — E119 Type 2 diabetes mellitus without complications: Secondary | ICD-10-CM | POA: Insufficient documentation

## 2018-01-22 DIAGNOSIS — Z7982 Long term (current) use of aspirin: Secondary | ICD-10-CM

## 2018-01-22 DIAGNOSIS — M47816 Spondylosis without myelopathy or radiculopathy, lumbar region: Secondary | ICD-10-CM | POA: Insufficient documentation

## 2018-01-22 DIAGNOSIS — M5137 Other intervertebral disc degeneration, lumbosacral region: Secondary | ICD-10-CM | POA: Insufficient documentation

## 2018-01-22 DIAGNOSIS — M4300 Spondylolysis, site unspecified: Secondary | ICD-10-CM | POA: Insufficient documentation

## 2018-01-22 DIAGNOSIS — Z79899 Other long term (current) drug therapy: Secondary | ICD-10-CM | POA: Insufficient documentation

## 2018-01-22 DIAGNOSIS — Z86010 Personal history of colonic polyps: Secondary | ICD-10-CM

## 2018-01-22 DIAGNOSIS — E559 Vitamin D deficiency, unspecified: Secondary | ICD-10-CM

## 2018-01-22 DIAGNOSIS — Z87891 Personal history of nicotine dependence: Secondary | ICD-10-CM | POA: Insufficient documentation

## 2018-01-22 DIAGNOSIS — M4310 Spondylolisthesis, site unspecified: Secondary | ICD-10-CM | POA: Diagnosis not present

## 2018-01-22 DIAGNOSIS — J309 Allergic rhinitis, unspecified: Secondary | ICD-10-CM

## 2018-01-22 DIAGNOSIS — M4316 Spondylolisthesis, lumbar region: Secondary | ICD-10-CM | POA: Insufficient documentation

## 2018-01-22 DIAGNOSIS — M720 Palmar fascial fibromatosis [Dupuytren]: Secondary | ICD-10-CM

## 2018-01-22 MED ORDER — GNP CALCIUM 1200 1200-1000 MG-UNIT PO CHEW: 1200.0000 mg | CHEWABLE_TABLET | Freq: Every day | ORAL | 5 refills | Status: DC

## 2018-01-22 MED ORDER — ERGOCALCIFEROL 1.25 MG (50000 UT) PO CAPS: 50000.0000 [IU] | ORAL_CAPSULE | ORAL | 0 refills | Status: AC

## 2018-01-22 MED ORDER — VITAMIN D3 125 MCG (5000 UT) PO CAPS: 1.0000 | ORAL_CAPSULE | Freq: Every day | ORAL | 5 refills | Status: AC

## 2018-01-22 MED ORDER — MAGNESIUM 500 MG PO CAPS: 500.0000 mg | ORAL_CAPSULE | Freq: Two times a day (BID) | ORAL | 5 refills | Status: AC

## 2018-01-22 NOTE — Patient Instructions (Signed)

## 2018-01-22 NOTE — Telephone Encounter (Signed)
Pharmacy calling to verify the Calcium script, does this need to be Calcium / VitD  combined plus other VitD or just the Calcium and separate Vit D.   Also Calcium only comes in 600 not 1200. Will need to send a new Script. quantity is wrong. Please call pharmacy

## 2018-01-22 NOTE — Telephone Encounter (Signed)
Spoke with Dr. Dossie Arbour, may take OTC calcium and vitamin D. Pharmacy notified. Prescription for Calcium Carbonate 600 mg #60, take 2 tablets daily called in.

## 2018-01-22 NOTE — Progress Notes (Signed)
Safety precautions to be maintained throughout the outpatient stay will include: orient to surroundings, keep bed in low position, maintain call bell within reach at all times, provide assistance with transfer out of bed and ambulation.  

## 2018-01-26 ENCOUNTER — Telehealth: Payer: Self-pay | Admitting: *Deleted

## 2018-02-08 ENCOUNTER — Telehealth: Payer: Self-pay | Admitting: Dietician

## 2018-02-08 NOTE — Telephone Encounter (Signed)
Returned a Dance movement psychotherapist from patient to cancel his appointment for 02/13/18 with a request for a return call. Offered to reschedule and requested patient to call back if he would like to do so.

## 2018-02-13 ENCOUNTER — Ambulatory Visit: Payer: Medicare Other | Admitting: Dietician

## 2018-02-14 DIAGNOSIS — L728 Other follicular cysts of the skin and subcutaneous tissue: Secondary | ICD-10-CM | POA: Diagnosis not present

## 2018-02-14 DIAGNOSIS — L821 Other seborrheic keratosis: Secondary | ICD-10-CM | POA: Diagnosis not present

## 2018-02-14 DIAGNOSIS — L218 Other seborrheic dermatitis: Secondary | ICD-10-CM | POA: Diagnosis not present

## 2018-02-15 ENCOUNTER — Encounter: Payer: Self-pay | Admitting: Internal Medicine

## 2018-02-15 ENCOUNTER — Ambulatory Visit: Payer: Medicare Other

## 2018-02-15 ENCOUNTER — Ambulatory Visit
Admission: RE | Admit: 2018-02-15 | Discharge: 2018-02-15 | Disposition: A | Discharge status: Home / Self Care | Payer: Medicare Other | Source: Ambulatory Visit | Attending: Pain Medicine | Admitting: Pain Medicine

## 2018-02-15 ENCOUNTER — Ambulatory Visit (INDEPENDENT_AMBULATORY_CARE_PROVIDER_SITE_OTHER): Payer: Medicare Other | Admitting: Internal Medicine

## 2018-02-15 VITALS — BP 126/72 | HR 87 | Temp 98.1°F | Ht 69.0 in | Wt 176.0 lb

## 2018-02-15 DIAGNOSIS — M47816 Spondylosis without myelopathy or radiculopathy, lumbar region: Secondary | ICD-10-CM | POA: Diagnosis not present

## 2018-02-15 DIAGNOSIS — M5136 Other intervertebral disc degeneration, lumbar region: Secondary | ICD-10-CM | POA: Insufficient documentation

## 2018-02-15 DIAGNOSIS — M4310 Spondylolisthesis, site unspecified: Secondary | ICD-10-CM

## 2018-02-15 DIAGNOSIS — I10 Essential (primary) hypertension: Secondary | ICD-10-CM

## 2018-02-15 DIAGNOSIS — M4300 Spondylolysis, site unspecified: Secondary | ICD-10-CM

## 2018-02-15 DIAGNOSIS — M5441 Lumbago with sciatica, right side: Secondary | ICD-10-CM | POA: Diagnosis not present

## 2018-02-15 DIAGNOSIS — E119 Type 2 diabetes mellitus without complications: Secondary | ICD-10-CM | POA: Diagnosis not present

## 2018-02-15 DIAGNOSIS — G8929 Other chronic pain: Secondary | ICD-10-CM | POA: Insufficient documentation

## 2018-02-15 DIAGNOSIS — M79604 Pain in right leg: Secondary | ICD-10-CM | POA: Insufficient documentation

## 2018-02-15 DIAGNOSIS — M4807 Spinal stenosis, lumbosacral region: Secondary | ICD-10-CM | POA: Diagnosis not present

## 2018-02-15 DIAGNOSIS — M5127 Other intervertebral disc displacement, lumbosacral region: Secondary | ICD-10-CM | POA: Diagnosis not present

## 2018-02-15 DIAGNOSIS — M5137 Other intervertebral disc degeneration, lumbosacral region: Secondary | ICD-10-CM | POA: Diagnosis not present

## 2018-02-15 LAB — POCT GLYCOSYLATED HEMOGLOBIN (HGB A1C): Hemoglobin A1C: 6.6 % — AB (ref 4.0–5.6)

## 2018-02-15 NOTE — Progress Notes (Signed)
Pre visit review using our clinic review tool, if applicable. No additional management support is needed unless otherwise documented below in the visit note. 

## 2018-02-15 NOTE — Patient Instructions (Addendum)
Cinnamon capsules can help with blood sugar 500 mg 2 x per day  Debrox ear drops ear wax drops    Diabetes Mellitus and Nutrition, Adult When you have diabetes (diabetes mellitus), it is very important to have healthy eating habits because your blood sugar (glucose) levels are greatly affected by what you eat and drink. Eating healthy foods in the appropriate amounts, at about the same times every day, can help you:  Control your blood glucose.  Lower your risk of heart disease.  Improve your blood pressure.  Reach or maintain a healthy weight. Every person with diabetes is different, and each person has different needs for a meal plan. Your health care provider may recommend that you work with a diet and nutrition specialist (dietitian) to make a meal plan that is best for you. Your meal plan may vary depending on factors such as:  The calories you need.  The medicines you take.  Your weight.  Your blood glucose, blood pressure, and cholesterol levels.  Your activity level.  Other health conditions you have, such as heart or kidney disease. How do carbohydrates affect me? Carbohydrates, also called carbs, affect your blood glucose level more than any other type of food. Eating carbs naturally raises the amount of glucose in your blood. Carb counting is a method for keeping track of how many carbs you eat. Counting carbs is important to keep your blood glucose at a healthy level, especially if you use insulin or take certain oral diabetes medicines. It is important to know how many carbs you can safely have in each meal. This is different for every person. Your dietitian can help you calculate how many carbs you should have at each meal and for each snack. Foods that contain carbs include:  Bread, cereal, rice, pasta, and crackers.  Potatoes and corn.  Peas, beans, and lentils.  Milk and yogurt.  Fruit and juice.  Desserts, such as cakes, cookies, ice cream, and candy. How  does alcohol affect me? Alcohol can cause a sudden decrease in blood glucose (hypoglycemia), especially if you use insulin or take certain oral diabetes medicines. Hypoglycemia can be a life-threatening condition. Symptoms of hypoglycemia (sleepiness, dizziness, and confusion) are similar to symptoms of having too much alcohol. If your health care provider says that alcohol is safe for you, follow these guidelines:  Limit alcohol intake to no more than 1 drink per day for nonpregnant women and 2 drinks per day for men. One drink equals 12 oz of beer, 5 oz of wine, or 1 oz of hard liquor.  Do not drink on an empty stomach.  Keep yourself hydrated with water, diet soda, or unsweetened iced tea.  Keep in mind that regular soda, juice, and other mixers may contain a lot of sugar and must be counted as carbs. What are tips for following this plan?  Reading food labels  Start by checking the serving size on the "Nutrition Facts" label of packaged foods and drinks. The amount of calories, carbs, fats, and other nutrients listed on the label is based on one serving of the item. Many items contain more than one serving per package.  Check the total grams (g) of carbs in one serving. You can calculate the number of servings of carbs in one serving by dividing the total carbs by 15. For example, if a food has 30 g of total carbs, it would be equal to 2 servings of carbs.  Check the number of grams (g) of  saturated and trans fats in one serving. Choose foods that have low or no amount of these fats.  Check the number of milligrams (mg) of salt (sodium) in one serving. Most people should limit total sodium intake to less than 2,300 mg per day.  Always check the nutrition information of foods labeled as "low-fat" or "nonfat". These foods may be higher in added sugar or refined carbs and should be avoided.  Talk to your dietitian to identify your daily goals for nutrients listed on the  label. Shopping  Avoid buying canned, premade, or processed foods. These foods tend to be high in fat, sodium, and added sugar.  Shop around the outside edge of the grocery store. This includes fresh fruits and vegetables, bulk grains, fresh meats, and fresh dairy. Cooking  Use low-heat cooking methods, such as baking, instead of high-heat cooking methods like deep frying.  Cook using healthy oils, such as olive, canola, or sunflower oil.  Avoid cooking with butter, cream, or high-fat meats. Meal planning  Eat meals and snacks regularly, preferably at the same times every day. Avoid going long periods of time without eating.  Eat foods high in fiber, such as fresh fruits, vegetables, beans, and whole grains. Talk to your dietitian about how many servings of carbs you can eat at each meal.  Eat 4-6 ounces (oz) of lean protein each day, such as lean meat, chicken, fish, eggs, or tofu. One oz of lean protein is equal to: ? 1 oz of meat, chicken, or fish. ? 1 egg. ?  cup of tofu.  Eat some foods each day that contain healthy fats, such as avocado, nuts, seeds, and fish. Lifestyle  Check your blood glucose regularly.  Exercise regularly as told by your health care provider. This may include: ? 150 minutes of moderate-intensity or vigorous-intensity exercise each week. This could be brisk walking, biking, or water aerobics. ? Stretching and doing strength exercises, such as yoga or weightlifting, at least 2 times a week.  Take medicines as told by your health care provider.  Do not use any products that contain nicotine or tobacco, such as cigarettes and e-cigarettes. If you need help quitting, ask your health care provider.  Work with a Social worker or diabetes educator to identify strategies to manage stress and any emotional and social challenges. Questions to ask a health care provider  Do I need to meet with a diabetes educator?  Do I need to meet with a dietitian?  What  number can I call if I have questions?  When are the best times to check my blood glucose? Where to find more information:  American Diabetes Association: diabetes.org  Academy of Nutrition and Dietetics: www.eatright.[employer] of Diabetes and Digestive and Kidney Diseases (NIH): [employer] Summary  A healthy meal plan will help you control your blood glucose and maintain a healthy lifestyle.  Working with a diet and nutrition specialist (dietitian) can help you make a meal plan that is best for you.  Keep in mind that carbohydrates (carbs) and alcohol have immediate effects on your blood glucose levels. It is important to count carbs and to use alcohol carefully. This information is not intended to replace advice given to you by your health care provider. Make sure you discuss any questions you have with your health care provider. Document Released: 10/21/2004 Document Revised: 08/24/2016 Document Reviewed: 02/29/2016 Elsevier Interactive Patient Education  2019 [employer].

## 2018-02-15 NOTE — Progress Notes (Signed)
Chief Complaint  Patient presents with  . Follow-up   F/u  1. HTN controlled on norvasc 2.5 mg qd  2. DM 2 A1C 7.2 10/2017 on januvia 100 mg qd, metformin utd eye exam 09/2017  3. Chronic back pain b/l hip Xrays 01/26/18 normal and L spine Xray abnormal pending MRI L spine today at 6 pm    Review of Systems  Constitutional: Negative for weight loss.  HENT: Negative for hearing loss.   Eyes: Negative for blurred vision.  Respiratory: Negative for shortness of breath.   Cardiovascular: Negative for chest pain.  Gastrointestinal: Negative for abdominal pain.  Musculoskeletal: Positive for back pain.  Skin: Negative for rash.  Neurological: Negative for headaches.  Psychiatric/Behavioral: Negative for memory loss.   Past Medical History:  Diagnosis Date  . Allergy   . Chicken pox   . Colon polyps   . Diabetes mellitus without complication ([place])   . Hyperlipidemia   . Hypertension    Past Surgical History:  Procedure Laterality Date  . TONSILLECTOMY     Family History  Problem Relation Age of Onset  . Cancer Mother        bone, liver, lung former smoker ? lung died 05/17/2008  . Diabetes Mother   . Hypertension Mother   . Atrial fibrillation Sister    Social History   Socioeconomic History  . Marital status: Married    Spouse name: Not on file  . Number of children: Not on file  . Years of education: Not on file  . Highest education level: Not on file  Occupational History  . Not on file  Social Needs  . Financial resource strain: Not hard at all  . Food insecurity:    Worry: Never true    Inability: Never true  . Transportation needs:    Medical: No    Non-medical: No  Tobacco Use  . Smoking status: Former Research scientist (life sciences)  . Smokeless tobacco: Never Used  Substance and Sexual Activity  . Alcohol use: Not on file    Comment: occasional, maybe once a week  . Drug use: No  . Sexual activity: Yes    Partners: Female  Lifestyle  . Physical activity:    Days per week: 3  days    Minutes per session: 30 min  . Stress: Not at all  Relationships  . Social connections:    Talks on phone: Not on file    Gets together: Not on file    Attends religious service: Not on file    Active member of club or organization: Not on file    Attends meetings of clubs or organizations: Not on file    Relationship status: Married  . Intimate partner violence:    Fear of current or ex partner: No    Emotionally abused: No    Physically abused: No    Forced sexual activity: No  Other Topics Concern  . 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 amLODipine (NORVASC) 2.5 MG tablet Take 1 tablet (2.5 mg total) by mouth daily.  Marland Kitchen aspirin EC 81 MG tablet Take 1 tablet (81 mg total) by mouth daily.  Marland Kitchen atorvastatin (LIPITOR) 20 MG tablet Take 1 tablet (20  mg total) by mouth daily at 6 PM.  . Calcium Carbonate-Vit D-Min (GNP CALCIUM 1200) 1200-1000 MG-UNIT CHEW Chew 1,200 mg by mouth daily with breakfast. Take in combination with vitamin D and magnesium.  . cetirizine (ZYRTEC) 10 MG tablet Take 1 tablet (10 mg total) by mouth daily.  . Cholecalciferol (VITAMIN D3) 125 MCG (5000 UT) CAPS Take 1 capsule (5,000 Units total) by mouth daily with breakfast. Take along with calcium and magnesium.  . Cinnamon 500 MG TABS Take by mouth.  . ergocalciferol (VITAMIN D2) 1.25 MG (50000 UT) capsule Take 1 capsule (50,000 Units total) by mouth 2 (two) times a week. X 6 weeks.  Marland Kitchen glucose blood (FREESTYLE LITE) test strip Use as instructed qd to bid E11.9  . Magnesium 500 MG CAPS Take 1 capsule (500 mg total) by mouth 2 (two) times daily at 8 am and 10 pm.  . metFORMIN (GLUCOPHAGE) 1000 MG tablet Take 1 tablet (1,000 mg total) by mouth 2 (two) times daily with a meal.  . Multiple Vitamin  (MULTIVITAMIN) tablet Take 1 tablet by mouth daily.  . sitaGLIPtin (JANUVIA) 100 MG tablet Take 1 tablet (100 mg total) by mouth daily.   Allergies  Allergen Reactions  . Biofreeze [Menthol (Topical Analgesic)]     Rash    Recent Results (from the past 2160 hour(s))  Compliance Drug Analysis, Ur     Status: None   Collection Time: 01/08/18 10:40 AM  Result Value Ref Range   Summary FINAL     Comment: ==================================================================== [facility]RE COMP DRUG ANALYSIS,UR ==================================================================== Test                             Result       Flag       Units Drug Present not Declared for Prescription Verification   Naproxen                       PRESENT      UNEXPECTED Drug Absent but Declared for Prescription Verification   Salicylate                     Not Detected UNEXPECTED    Aspirin, as indicated in the declared medication list, is not    always detected even when used as directed. ==================================================================== Test                      Result    Flag   Units      Ref Range   Creatinine              130              mg/dL      >=20 ==================================================================== Declared Medications:  The flagging and interpretation on this report are based on the  following declared medications.  Unexpected results may arise fro m  inaccuracies in the declared medications.  **Note: The testing scope of this panel does not include small to  moderate amounts of these reported medications:  Aspirin (Aspirin 81)  **Note: The testing scope of this panel does not include following  reported medications:  Amlodipine (Norvasc)  Atorvastatin (Lipitor)  Cetirizine (Zyrtec)  Cinnamon Bark  Metformin  Multivitamin  Sitagliptin (Januvia)  Vitamin D3 ==================================================================== For clinical consultation,  please call [phone]. ====================================================================   Magnesium     Status: Abnormal   Collection Time: 01/08/18 10:43  AM  Result Value Ref Range   Magnesium 1.4 (L) 1.6 - 2.3 mg/dL  Vitamin B12     Status: None   Collection Time: 01/08/18 10:43 AM  Result Value Ref Range   Vitamin B-12 469 232 - 1,245 pg/mL  Sedimentation rate     Status: None   Collection Time: 01/08/18 10:43 AM  Result Value Ref Range   Sed Rate 8 0 - 30 mm/hr  C-reactive protein     Status: None   Collection Time: 01/08/18 10:43 AM  Result Value Ref Range   CRP 3 0 - 10 mg/L   Objective  Body mass index is 25.99 kg/m. Wt Readings from Last 3 Encounters:  02/15/18 176 lb (79.8 kg)  01/22/18 169 lb (76.7 kg)  01/08/18 171 lb (77.6 kg)   Temp Readings from Last 3 Encounters:  02/15/18 98.1 F (36.7 C) (Oral)  01/22/18 (!) 97 F (36.1 C) (Oral)  01/08/18 97.9 F (36.6 C)   BP Readings from Last 3 Encounters:  02/15/18 126/72  01/22/18 134/72  01/08/18 (!) 152/74   Pulse Readings from Last 3 Encounters:  02/15/18 87  01/22/18 78  01/08/18 85    Physical Exam Vitals signs and nursing note reviewed.  Constitutional:      Appearance: Normal appearance. He is well-developed.  HENT:     Head: Normocephalic and atraumatic.     Nose: Nose normal.     Mouth/Throat:     Mouth: Mucous membranes are moist.     Pharynx: Oropharynx is clear.  Eyes:     Conjunctiva/sclera: Conjunctivae normal.     Pupils: Pupils are equal, round, and reactive to light.  Cardiovascular:     Rate and Rhythm: Normal rate and regular rhythm.     Heart sounds: Normal heart sounds.  Pulmonary:     Effort: Pulmonary effort is normal.     Breath sounds: Normal breath sounds.  Skin:    General: Skin is warm and dry.  Neurological:     General: No focal deficit present.     Mental Status: He is alert and oriented to person, place, and time.     Coordination: Romberg sign  negative.  Psychiatric:        Attention and Perception: Attention and perception normal.        Mood and Affect: Mood and affect normal.        Speech: Speech normal.        Behavior: Behavior normal. Behavior is cooperative.        Thought Content: Thought content normal.        Cognition and Memory: Cognition and memory normal.        Judgment: Judgment normal.     Assessment   1. HTN  2. DM 2 A1C 6.6 today at goal  3. Chronic back pain MRI pending L spine today  4. HM Plan   1. Cont meds  2. Cont meds  3. MRI today L spine  4.  Flu shot utd  Tdap utd  prevnar rec and will need pna 23 in 1 year [place] 10[place]19[place]   had 2/2 shingrix vaccines    DRE in future PSA 2.15 10/16/17   colonoscopy had 11/23/15 moderate to severe diverticulosis 2 polyps no pathology report noted Dr. Rexene Alberts [facility] requested recordsfor pathology report -rec repeat in 5 years with h/o polyps Declines hep B/C testing for now   dermatology saw Dr. Kellie Moor  tbse multiple nevidue to see again 02/15/2018 saw cyst removal back 03/07/2018   Provider: Dr. Olivia Mackie McLean-Scocuzza-Internal Medicine

## 2018-02-22 ENCOUNTER — Encounter: Payer: Self-pay | Admitting: Dietician

## 2018-02-22 NOTE — Progress Notes (Signed)
Have not heard back from patient to reschedule his cancelled appointment from 02/13/18. Sent letter to referring provider.

## 2018-04-18 DIAGNOSIS — E1140 Type 2 diabetes mellitus with diabetic neuropathy, unspecified: Secondary | ICD-10-CM | POA: Diagnosis not present

## 2018-04-18 DIAGNOSIS — B351 Tinea unguium: Secondary | ICD-10-CM | POA: Diagnosis not present

## 2018-05-21 ENCOUNTER — Encounter: Payer: Self-pay | Admitting: Family Medicine

## 2018-05-21 ENCOUNTER — Other Ambulatory Visit: Payer: Self-pay

## 2018-05-21 ENCOUNTER — Ambulatory Visit (INDEPENDENT_AMBULATORY_CARE_PROVIDER_SITE_OTHER): Payer: Medicare Other | Admitting: Family Medicine

## 2018-05-21 DIAGNOSIS — L02219 Cutaneous abscess of trunk, unspecified: Secondary | ICD-10-CM | POA: Diagnosis not present

## 2018-05-21 MED ORDER — SULFAMETHOXAZOLE-TRIMETHOPRIM 800-160 MG PO TABS: 1.0000 | ORAL_TABLET | Freq: Two times a day (BID) | ORAL | 0 refills | Status: DC

## 2018-05-21 NOTE — Progress Notes (Signed)
Patient ID: Andrew Gonzalez, male   DOB: Aug 27, 1950, 68 y.o.   MRN: [id number]  Virtual Visit via video Note  This visit type was conducted due to national recommendations for restrictions regarding the COVID-19 pandemic (e.g. social distancing).  This format is felt to be most appropriate for this patient at this time.  All issues noted in this document were discussed and addressed.  No physical exam was performed (except for noted visual exam findings with Video Visits).   I connected with Shirlyn Goltz on 05/21/18 at  9:20 AM EDT by a video enabled telemedicine application or telephone and verified that I am speaking with the correct person using two identifiers. Location patient: home Location provider: [facility] Persons participating in the virtual visit: patient, provider  I discussed the limitations, risks, security and privacy concerns of performing an evaluation and management service by video and the availability of in person appointments. I also discussed with the patient that there may be a patient responsible charge related to this service. The patient expressed understanding and agreed to proceed.   HPI:  Patient and I connected via video today due to cyst area on back that he feels is infected.  Wife states the area is approximately size of a $0.50 piece, and is slightly raised, is tender to touch and appears red and warm.  Patient denies any fever or chills.  Denies body aches.  Area has not opened or begun to drain.  Denies any cough, chest pain or shortness of breath.  Denies any GI or GU issues.  Patient states he has had an area similar to this about 10 years ago, it popped open on its own and drained out pus.   ROS: See pertinent positives and negatives per HPI.  Past Medical History:  Diagnosis Date  . Allergy   . Chicken pox   . Colon polyps   . Diabetes mellitus without complication ([place])   . Hyperlipidemia   . Hypertension     Past Surgical History:  Procedure  Laterality Date  . TONSILLECTOMY      Family History  Problem Relation Age of Onset  . Cancer Mother        bone, liver, lung former smoker ? lung died 2008-05-18  . Diabetes Mother   . Hypertension Mother   . Atrial fibrillation Sister    Social History   Tobacco Use  . Smoking status: Former Research scientist (life sciences)  . Smokeless tobacco: Never Used  Substance Use Topics  . Alcohol use: Not on file    Comment: occasional, maybe once a week    Current Outpatient Medications:  .  amLODipine (NORVASC) 2.5 MG tablet, Take 1 tablet (2.5 mg total) by mouth daily., Disp: 90 tablet, Rfl: 3 .  aspirin EC 81 MG tablet, Take 1 tablet (81 mg total) by mouth daily., Disp: 90 tablet, Rfl: 3 .  atorvastatin (LIPITOR) 20 MG tablet, Take 1 tablet (20 mg total) by mouth daily at 6 PM., Disp: 90 tablet, Rfl: 3 .  Calcium Carbonate-Vit D-Min (GNP CALCIUM 1200) 1200-1000 MG-UNIT CHEW, Chew 1,200 mg by mouth daily with breakfast. Take in combination with vitamin D and magnesium., Disp: 30 tablet, Rfl: 5 .  cetirizine (ZYRTEC) 10 MG tablet, Take 1 tablet (10 mg total) by mouth daily., Disp: 90 tablet, Rfl: 3 .  Cholecalciferol (VITAMIN D3) 125 MCG (5000 UT) CAPS, Take 1 capsule (5,000 Units total) by mouth daily with breakfast. Take along with calcium and magnesium., Disp: 30 capsule, Rfl: 5 .  Cinnamon 500 MG TABS, Take by mouth., Disp: , Rfl:  .  glucose blood (FREESTYLE LITE) test strip, Use as instructed qd to bid E11.9, Disp: 180 each, Rfl: 12 .  Magnesium 500 MG CAPS, Take 1 capsule (500 mg total) by mouth 2 (two) times daily at 8 am and 10 pm., Disp: 60 capsule, Rfl: 5 .  metFORMIN (GLUCOPHAGE) 1000 MG tablet, Take 1 tablet (1,000 mg total) by mouth 2 (two) times daily with a meal., Disp: 180 tablet, Rfl: 3 .  Multiple Vitamin (MULTIVITAMIN) tablet, Take 1 tablet by mouth daily., Disp: , Rfl:  .  sitaGLIPtin (JANUVIA) 100 MG tablet, Take 1 tablet (100 mg total) by mouth daily., Disp: 90 tablet, Rfl: 3 .   sulfamethoxazole-trimethoprim (BACTRIM DS) 800-160 MG tablet, Take 1 tablet by mouth 2 (two) times daily., Disp: 20 tablet, Rfl: 0  EXAM:  VITALS: No fevers, temp 98.3 F   GENERAL: alert, oriented, appears well and in no acute distress  HEENT: atraumatic, conjunttiva clear, no obvious abnormalities on inspection of external nose and ears  NECK: normal movements of the head and neck  LUNGS: on inspection no signs of respiratory distress, breathing rate appears normal, no obvious gross SOB, gasping or wheezing  CV: no obvious cyanosis  SKIN: Through the video camera I am able to see a red raised area on patient's left upper back that is approximately the size of a $0.50 piece.  Skin does appear red and from video I can ascertain that the area does appear slightly raised.  It is not open or draining at this time.  MS: moves all visible extremities without noticeable abnormality  PSYCH/NEURO: pleasant and cooperative, no obvious depression or anxiety, speech and thought processing grossly intact  ASSESSMENT AND PLAN:  Discussed the following assessment and plan:  Abscess, trunk - Plan: sulfamethoxazole-trimethoprim (BACTRIM DS) 800-160 MG tablet  Patient will take Bactrim twice daily for 10 days to treat suspected infected abscess.  Advised patient to keep skin clean and dry and monitor for any increasing redness, increased size of abscess area.  Also advised patient that area potentially could open and drain on its own, if this happens that is okay.  Advised to apply bacitracin ointment and cover with bandage if area does open.    I discussed the assessment and treatment plan with the patient. The patient was provided an opportunity to ask questions and all were answered. The patient agreed with the plan and demonstrated an understanding of the instructions.   The patient was advised to call back or seek an in-person evaluation if the symptoms worsen or if the condition fails to improve  as anticipated.  Advised if symptoms worsen, redness increases, fever develops, abscess size increases to call office right away for further instruction.   Jodelle Green, FNP

## 2018-05-25 ENCOUNTER — Ambulatory Visit: Payer: Self-pay | Admitting: *Deleted

## 2018-05-25 ENCOUNTER — Other Ambulatory Visit: Payer: Self-pay | Admitting: Internal Medicine

## 2018-05-25 ENCOUNTER — Encounter: Payer: Self-pay | Admitting: Internal Medicine

## 2018-05-25 ENCOUNTER — Telehealth: Payer: Self-pay

## 2018-05-25 DIAGNOSIS — L0291 Cutaneous abscess, unspecified: Secondary | ICD-10-CM

## 2018-05-25 MED ORDER — DOXYCYCLINE HYCLATE 100 MG PO TABS: 100.0000 mg | ORAL_TABLET | Freq: Two times a day (BID) | ORAL | 0 refills | Status: DC

## 2018-05-25 NOTE — Telephone Encounter (Signed)
Spoken to patient. He states he feels hot but no fever. Reiterated to stop abx and take benadryl and pick up other medication.  He has a cough still with SOB once finished with coughing episode. Patient was instructed that if sx worsen after taking benadryl or start having any other allergic reaction symptoms to go to ED. Patient stated he will comply with directions.

## 2018-05-25 NOTE — Telephone Encounter (Signed)
Message from Rayann Heman sent at 05/25/2018 7:27 AM EDT   Summary: medication reaction    sulfamethoxazole-trimethoprim (BACTRIM DS) 800-160 MG tablet [[id number]]. Pt called and stated that since he has taken this medication he has been feeling nauseous and having joint pain. Pt would like a call back from a nurse . Please advise.           Returned pt call regarding reactions after starting Bactrim DS. Was prescribed this on 04/13.  He has taken a total of 8 tablets. During the night, he could not keep water down. His face was red yesterday. And he is having joint pain.  No fever or any other symptoms. Has not taking any more since yesterday and has been taking 2 tabs a day as prescribed. Will send to [facility] at [facility] for review and recommendation and a call back to patient. Pt voiced understanding.   Reason for Disposition . Caller has NON-URGENT medication question about med that PCP prescribed and triager unable to answer question  Answer Assessment - Initial Assessment Questions 1. SYMPTOMS: "Do you have any symptoms?"     yes 2. SEVERITY: If symptoms are present, ask "Are they mild, moderate or severe?"     Moderate to severe  Protocols used: MEDICATION QUESTION CALL-A-AH

## 2018-05-25 NOTE — Telephone Encounter (Signed)
Stop the medication now Does he have an abscess still?   We can change the antibiotic to Doxycycline -doe she want to do this?   Use hibiclens soap otc to cleanse skin minus face and private area

## 2018-05-25 NOTE — Telephone Encounter (Signed)
Patient still has abscess, he is willing to change to doxy.

## 2018-05-25 NOTE — Telephone Encounter (Signed)
Copied from [place] [id number]. Topic: General - Inquiry >> May 25, 2018 12:54 PM [name] wrote: Reason for CRM: Patient would like a callback from Dr. Aundra Dubin or nurse at [phone].

## 2018-05-25 NOTE — Telephone Encounter (Signed)
Noted  TMS 

## 2018-07-11 ENCOUNTER — Encounter: Payer: Self-pay | Admitting: Pain Medicine

## 2018-07-11 NOTE — Progress Notes (Signed)
I personally reviewed these results today, @T @ at 12:14 PM.  Results: please see report.  Plan of care: to be discussed at our next encounter.

## 2018-08-07 DIAGNOSIS — E1140 Type 2 diabetes mellitus with diabetic neuropathy, unspecified: Secondary | ICD-10-CM | POA: Diagnosis not present

## 2018-08-07 DIAGNOSIS — B351 Tinea unguium: Secondary | ICD-10-CM | POA: Diagnosis not present

## 2018-08-16 ENCOUNTER — Ambulatory Visit (INDEPENDENT_AMBULATORY_CARE_PROVIDER_SITE_OTHER): Payer: Medicare Other | Admitting: Internal Medicine

## 2018-08-16 ENCOUNTER — Other Ambulatory Visit: Payer: Self-pay

## 2018-08-16 ENCOUNTER — Encounter: Payer: Self-pay | Admitting: Internal Medicine

## 2018-08-16 VITALS — BP 139/80

## 2018-08-16 DIAGNOSIS — E559 Vitamin D deficiency, unspecified: Secondary | ICD-10-CM | POA: Diagnosis not present

## 2018-08-16 DIAGNOSIS — Z125 Encounter for screening for malignant neoplasm of prostate: Secondary | ICD-10-CM

## 2018-08-16 DIAGNOSIS — I10 Essential (primary) hypertension: Secondary | ICD-10-CM

## 2018-08-16 DIAGNOSIS — Z1329 Encounter for screening for other suspected endocrine disorder: Secondary | ICD-10-CM

## 2018-08-16 DIAGNOSIS — E119 Type 2 diabetes mellitus without complications: Secondary | ICD-10-CM

## 2018-08-16 DIAGNOSIS — J309 Allergic rhinitis, unspecified: Secondary | ICD-10-CM

## 2018-08-16 MED ORDER — ASPIRIN EC 81 MG PO TBEC: 81.0000 mg | DELAYED_RELEASE_TABLET | Freq: Every day | ORAL | 3 refills | Status: DC

## 2018-08-16 MED ORDER — FREESTYLE LITE TEST VI STRP: ORAL_STRIP | 12 refills | Status: DC

## 2018-08-16 MED ORDER — METFORMIN HCL 1000 MG PO TABS: 1000.0000 mg | ORAL_TABLET | Freq: Two times a day (BID) | ORAL | 3 refills | Status: DC

## 2018-08-16 MED ORDER — CETIRIZINE HCL 10 MG PO TABS: 10.0000 mg | ORAL_TABLET | Freq: Every day | ORAL | 3 refills | Status: DC

## 2018-08-16 MED ORDER — SITAGLIPTIN PHOSPHATE 100 MG PO TABS: 100.0000 mg | ORAL_TABLET | Freq: Every day | ORAL | 3 refills | Status: DC

## 2018-08-16 MED ORDER — FREESTYLE LANCETS MISC: 3 refills | Status: DC

## 2018-08-16 MED ORDER — AMLODIPINE BESYLATE 2.5 MG PO TABS: 2.5000 mg | ORAL_TABLET | Freq: Every day | ORAL | 3 refills | Status: DC

## 2018-08-16 MED ORDER — ATORVASTATIN CALCIUM 20 MG PO TABS: 20.0000 mg | ORAL_TABLET | Freq: Every day | ORAL | 3 refills | Status: DC

## 2018-08-16 NOTE — Progress Notes (Signed)
Virtual Visit via Video Note  I connected with Andrew Gonzalez   on 08/16/18 at  8:30 AM EDT by a video enabled telemedicine application and verified that I am speaking with the correct person using two identifiers.  Location patient: home Location provider:work Persons participating in the virtual visit: patient, provider  I discussed the limitations of evaluation and management by telemedicine and the availability of in person appointments. The patient expressed understanding and agreed to proceed.   HPI: 1. DM 2 A1C 6.6 on Metformin 100 bid and Januvia 100 mg qd cbg today 120. He asks about DM shoes will CC Dr. Cleda Mccreedy no clavi reports his feet hurt with walking  2. HTN BP today 139/80 he had meds about 1 hr ago norvasc 2.5 mg qd   He needs refills of all meds and wants BP cuff will Rx BP cuff   ROS: See pertinent positives and negatives per HPI.  Past Medical History:  Diagnosis Date  . Allergy   . Chicken pox   . Colon polyps   . Diabetes mellitus without complication ([place])   . Hyperlipidemia   . Hypertension     Past Surgical History:  Procedure Laterality Date  . TONSILLECTOMY      Family History  Problem Relation Age of Onset  . Cancer Mother        bone, liver, lung former smoker ? lung died 2008-05-30  . Diabetes Mother   . Hypertension Mother   . Atrial fibrillation Sister     SOCIAL HX: married lives with wife and grandson father living age 38 y.o    Current Outpatient Medications:  .  amLODipine (NORVASC) 2.5 MG tablet, Take 1 tablet (2.5 mg total) by mouth daily., Disp: 90 tablet, Rfl: 3 .  aspirin EC 81 MG tablet, Take 1 tablet (81 mg total) by mouth daily., Disp: 90 tablet, Rfl: 3 .  atorvastatin (LIPITOR) 20 MG tablet, Take 1 tablet (20 mg total) by mouth daily at 6 PM., Disp: 90 tablet, Rfl: 3 .  cetirizine (ZYRTEC) 10 MG tablet, Take 1 tablet (10 mg total) by mouth daily., Disp: 90 tablet, Rfl: 3 .  Cinnamon 500 MG TABS, Take by mouth., Disp: , Rfl:  .   glucose blood (FREESTYLE LITE) test strip, Use as instructed qd to bid E11.9, Disp: 180 each, Rfl: 12 .  metFORMIN (GLUCOPHAGE) 1000 MG tablet, Take 1 tablet (1,000 mg total) by mouth 2 (two) times daily with a meal., Disp: 180 tablet, Rfl: 3 .  Multiple Vitamin (MULTIVITAMIN) tablet, Take 1 tablet by mouth daily., Disp: , Rfl:  .  sitaGLIPtin (JANUVIA) 100 MG tablet, Take 1 tablet (100 mg total) by mouth daily., Disp: 90 tablet, Rfl: 3 .  Calcium Carbonate-Vit D-Min (GNP CALCIUM 1200) 1200-1000 MG-UNIT CHEW, Chew 1,200 mg by mouth daily with breakfast. Take in combination with vitamin D and magnesium., Disp: 30 tablet, Rfl: 5 .  Lancets (FREESTYLE) lancets, Qd to bid Use as instructed E11.9, Disp: 200 each, Rfl: 3  EXAM:  VITALS per patient if applicable:  GENERAL: alert, oriented, appears well and in no acute distress  HEENT: atraumatic, conjunttiva clear, no obvious abnormalities on inspection of external nose and ears  NECK: normal movements of the head and neck  LUNGS: on inspection no signs of respiratory distress, breathing rate appears normal, no obvious gross SOB, gasping or wheezing  CV: no obvious cyanosis  MS: moves all visible extremities without noticeable abnormality  PSYCH/NEURO: pleasant and cooperative, no obvious depression  or anxiety, speech and thought processing grossly intact  ASSESSMENT AND PLAN:  Discussed the following assessment and plan:  Vitamin D deficiency - Plan: Vitamin D (25 hydroxy)  Type 2 diabetes mellitus without complication, without long-term current use of insulin (HCC) - Plan: sitaGLIPtin (JANUVIA) 100 MG tablet, metFORMIN (GLUCOPHAGE) 1000 MG tablet, glucose blood (FREESTYLE LITE) test strip, atorvastatin (LIPITOR) 20 MG tablet, aspirin EC 81 MG tablet, Lancets (FREESTYLE) lancets, Lipid panel, Urinalysis, Routine w reflex microscopic, Protein / Creatinine Ratio, Urine, Hemoglobin A1c Pt wants DM shoes CC Dr. Cleda Mccreedy   Allergic rhinitis,  unspecified seasonality, unspecified trigger - Plan: cetirizine (ZYRTEC) 10 MG tablet  Essential hypertension - Plan: amLODipine (NORVASC) 2.5 MG tablet, Comprehensive metabolic panel, CBC with Differential/Platelet, Lipid panel, Urinalysis, Routine w reflex microscopic Rx BP cuff   hM Fasting labs by 10/17/18 Flu shot utd  Tdap utd  prevnar rec and will need pna 23 in 1 year [place] 10[place]19/2018 at f/u  had 2/2 shingrix vaccines    DRE in futurePSA 2.15 10/16/17   colonoscopy had 11/23/15 moderate to severe diverticulosis 2 polyps no pathology report noted Dr. Rexene Alberts [facility] requested recordsfor pathology report -rec repeat in 5 years with h/o polyps Declines hep B/C testing for now   dermatologysawDr. Kellie Moor tbse multiple nevidue to see again 1/9/2020saw cyst removal back 03/07/2018   I discussed the assessment and treatment plan with the patient. The patient was provided an opportunity to ask questions and all were answered. The patient agreed with the plan and demonstrated an understanding of the instructions.   The patient was advised to call back or seek an in-person evaluation if the symptoms worsen or if the condition fails to improve as anticipated.  Time spent 25 minutes  Delorise Jackson, MD

## 2018-08-23 ENCOUNTER — Telehealth: Payer: Self-pay | Admitting: Internal Medicine

## 2018-08-23 ENCOUNTER — Encounter: Payer: Self-pay | Admitting: Internal Medicine

## 2018-08-23 NOTE — Telephone Encounter (Signed)
Copied from [place] [id number]. Topic: General - Other >> Aug 23, 2018  3:41 PM [place] Breath wrote: Reason for CRM: Patient called to notify the nurse or doctor that his prescription was sent to the pharmacy and it should have been mailed to his home.  Patient stated that this is the normal way that he receives the prescription.  He needs it mailed to him. Tried to call the office 3x and there was not answer.  Please call patient back at [phone]

## 2018-08-23 NOTE — Telephone Encounter (Signed)
Left message for patient to return call back. [facility] may obtain information. Need to know which pharmacy and medication.

## 2018-08-24 ENCOUNTER — Other Ambulatory Visit: Payer: Self-pay | Admitting: Internal Medicine

## 2018-08-24 DIAGNOSIS — J309 Allergic rhinitis, unspecified: Secondary | ICD-10-CM

## 2018-08-24 DIAGNOSIS — I10 Essential (primary) hypertension: Secondary | ICD-10-CM

## 2018-08-24 DIAGNOSIS — E119 Type 2 diabetes mellitus without complications: Secondary | ICD-10-CM

## 2018-08-24 MED ORDER — FREESTYLE LANCETS MISC: 3 refills | Status: DC

## 2018-08-24 MED ORDER — SITAGLIPTIN PHOSPHATE 100 MG PO TABS: 100.0000 mg | ORAL_TABLET | Freq: Every day | ORAL | 3 refills | Status: DC

## 2018-08-24 MED ORDER — METFORMIN HCL 1000 MG PO TABS: 1000.0000 mg | ORAL_TABLET | Freq: Two times a day (BID) | ORAL | 3 refills | Status: DC

## 2018-08-24 MED ORDER — CETIRIZINE HCL 10 MG PO TABS: 10.0000 mg | ORAL_TABLET | Freq: Every day | ORAL | 3 refills | Status: DC

## 2018-08-24 MED ORDER — FREESTYLE LITE TEST VI STRP: ORAL_STRIP | 12 refills | Status: DC

## 2018-08-24 MED ORDER — AMLODIPINE BESYLATE 2.5 MG PO TABS: 2.5000 mg | ORAL_TABLET | Freq: Every day | ORAL | 3 refills | Status: DC

## 2018-08-24 MED ORDER — ASPIRIN EC 81 MG PO TBEC: 81.0000 mg | DELAYED_RELEASE_TABLET | Freq: Every day | ORAL | 3 refills | Status: DC

## 2018-08-24 MED ORDER — ATORVASTATIN CALCIUM 20 MG PO TABS: 20.0000 mg | ORAL_TABLET | Freq: Every day | ORAL | 3 refills | Status: DC

## 2018-08-24 NOTE — Telephone Encounter (Signed)
I called the patient to inform him that his prescriptions were ready and asked if he would like them mailed or would he prefer to come by the office to pick them up. The patient stated he would be by to pick them up . The prescriptions have been placed in an envelope in the accordion for pick up.

## 2018-08-24 NOTE — Telephone Encounter (Signed)
Andrew Gonzalez Sorry  I missed  your  call. Dr Olivia Mackie and I had a discussion  about my medications as I had no more refills  She was going to mail me the prescriptions to hand carry to [facility].  Metforman Januva Atrovastin  Blood Pressure  Aspirin Zyrtec  Free style test strips Free style lancers

## 2018-09-17 ENCOUNTER — Encounter: Payer: Self-pay | Admitting: Internal Medicine

## 2018-09-18 ENCOUNTER — Other Ambulatory Visit: Payer: Self-pay | Admitting: Internal Medicine

## 2018-09-18 DIAGNOSIS — R937 Abnormal findings on diagnostic imaging of other parts of musculoskeletal system: Secondary | ICD-10-CM

## 2018-09-18 DIAGNOSIS — M5416 Radiculopathy, lumbar region: Secondary | ICD-10-CM

## 2018-09-18 MED ORDER — CYCLOBENZAPRINE HCL 5 MG PO TABS: 5.0000 mg | ORAL_TABLET | Freq: Every evening | ORAL | 2 refills | Status: DC | PRN

## 2018-09-18 NOTE — Telephone Encounter (Signed)
Wife is calling asking about status of med request

## 2018-09-24 ENCOUNTER — Encounter: Payer: Self-pay | Admitting: Pain Medicine

## 2018-09-25 ENCOUNTER — Ambulatory Visit (HOSPITAL_BASED_OUTPATIENT_CLINIC_OR_DEPARTMENT_OTHER): Payer: Medicare Other | Admitting: Pain Medicine

## 2018-09-25 ENCOUNTER — Other Ambulatory Visit: Payer: Self-pay

## 2018-09-25 DIAGNOSIS — M545 Low back pain: Secondary | ICD-10-CM

## 2018-09-25 DIAGNOSIS — M79604 Pain in right leg: Secondary | ICD-10-CM | POA: Diagnosis not present

## 2018-09-25 DIAGNOSIS — M5137 Other intervertebral disc degeneration, lumbosacral region: Secondary | ICD-10-CM | POA: Diagnosis not present

## 2018-09-25 DIAGNOSIS — M5441 Lumbago with sciatica, right side: Secondary | ICD-10-CM

## 2018-09-25 DIAGNOSIS — G8929 Other chronic pain: Secondary | ICD-10-CM | POA: Diagnosis not present

## 2018-09-25 NOTE — Progress Notes (Signed)
Pain Management Virtual Encounter Note - Virtual Visit via Telephone Telehealth (real-time audio visits between healthcare provider and patient).   Patient's Phone No. & Preferred Pharmacy:  [phone] (home); [phone] (mobile); (Preferred) [phone] [email or web address]  [id number] [address] [address], [place] [address] [place] [address] Phone: [phone] Fax: [phone]    Pre-screening note:  Our staff contacted Mr. Sheeler and offered him an "in person", "face-to-face" appointment versus a telephone encounter. He indicated preferring the telephone encounter, at this time.   Reason for Virtual Visit: COVID-19*  Social distancing based on CDC and AMA recommendations.   I contacted Shirlyn Goltz on 09/25/2018 via telephone.      I clearly identified myself as Gaspar Cola, MD. I verified that I was speaking with the correct person using two identifiers (Name: Tekoa Amon, and date of birth: Jun 29, 1950).  Advanced Informed Consent I sought verbal advanced consent from Shirlyn Goltz for virtual visit interactions. I informed Mr. Lebeau of possible security and privacy concerns, risks, and limitations associated with providing "not-in-person" medical evaluation and management services. I also informed Mr. Sylve of the availability of "in-person" appointments. Finally, I informed him that there would be a charge for the virtual visit and that he could be  personally, fully or partially, financially responsible for it. Mr. Simkin expressed understanding and agreed to proceed.   Historic Elements   Mr. Quanah Majka is a 68 y.o. year old, male patient evaluated today after his last encounter by our practice on Visit date not found. Mr. Eastham  has a past medical history of Allergy, Chicken pox, Colon polyps, Diabetes mellitus without complication ([place]), Hyperlipidemia, and Hypertension. He also  has a past  surgical history that includes Tonsillectomy. Mr. Niess has a current medication list which includes the following prescription(s): amlodipine, aspirin ec, atorvastatin, cetirizine, cinnamon, cyclobenzaprine, freestyle lite, freestyle, metformin, multivitamin, sitagliptin, and gnp calcium 1200. He  reports that he has quit smoking. He has never used smokeless tobacco. He reports that he does not use drugs. No history on file for alcohol. Mr. Cina is allergic to bactrim [sulfamethoxazole-trimethoprim] and biofreeze [menthol (topical analgesic)].   HPI  Today, he is being contacted for worsening of previously known (established) problem.  The patient does have a history of chronic low back pain and lower extremity pain secondary to ulcers.  Typically he does not get any indications for procedure from May but due to the fact that a couple weeks ago he started experiencing a flareup of his low back pain where he was not able to even walk, he decided to give [place] a call.  He indicates that the pain was in the lower back, bilaterally, with the pain being primarily in the midline and going towards the right side.  He also has been experiencing some right lower extremity pain going down to the level of the knee through the front of the leg.  Today I offered to help him with some oral steroids first, but he indicated that he usually gets gastrointestinal side effects with diarrhea when he tries to take oral steroids.  I therefore offered him a lumbar epidural steroid injection.  He indicated that he will think about it.  He was provided some Aleve and Flexeril by his primary care physician and he indicates that it has been working and he seems to be feeling somewhat better.  In view of the fact that he has  decided to think about the possibility of having an epidural steroid injection, I will go ahead and put a PRN order for it and if he does decide to go ahead and have it done, he was instructed to simply call and  get it activated.  Pharmacotherapy Assessment  Analgesic:  No opioid analgesics from our practice.    Monitoring: Pharmacotherapy: No side-effects or adverse reactions reported. [place] PMP: PDMP reviewed during this encounter.       Compliance: No problems identified. Effectiveness: Clinically acceptable. Plan: Refer to "POC".  UDS:  Summary  Date Value Ref Range Status  01/08/2018 FINAL  Final    Comment:    ==================================================================== TOXASSURE COMP DRUG ANALYSIS,UR ==================================================================== Test                             Result       Flag       Units Drug Present not Declared for Prescription Verification   Naproxen                       PRESENT      UNEXPECTED Drug Absent but Declared for Prescription Verification   Salicylate                     Not Detected UNEXPECTED    Aspirin, as indicated in the declared medication list, is not    always detected even when used as directed. ==================================================================== Test                      Result    Flag   Units      Ref Range   Creatinine              130              mg/dL      >=20 ==================================================================== Declared Medications:  The flagging and interpretation on this report are based on the  following declared medications.  Unexpected results may arise from  inaccuracies in the declared medications.  **Note: The testing scope of this panel does not include small to  moderate amounts of these reported medications:  Aspirin (Aspirin 81)  **Note: The testing scope of this panel does not include following  reported medications:  Amlodipine (Norvasc)  Atorvastatin (Lipitor)  Cetirizine (Zyrtec)  Cinnamon Bark  Metformin  Multivitamin  Sitagliptin (Januvia)  Vitamin D3 ==================================================================== For clinical  consultation, please call [phone]. ====================================================================    Laboratory Chemistry Profile (12 mo)  Renal: 10/17/2017: BUN 17; Creatinine, Ser 1.06  No results found for: Anise Salvo Hepatic: 10/17/2017: Albumin 4.3 Lab Results  Component Value Date   AST 14 10/17/2017   ALT 15 10/17/2017   Other: 10/16/2017: VITD 27.75 01/08/2018: CRP 3; Sed Rate 8; Vitamin B-12 469 Note: Above Lab results reviewed.  Imaging  Last 90 days:  No results found.  Assessment  The primary encounter diagnosis was Chronic low back pain (Primary Area of Pain) (Bilateral) (R>L) w/ sciatica (Right). Diagnoses of Chronic lower extremity pain (Secondary Area of Pain) (Right), DDD (degenerative disc disease), lumbosacral, and Acute exacerbation of chronic low back pain were also pertinent to this visit.  Plan of Care  I am having Toma Arts maintain his Cinnamon, multivitamin, GNP Calcium 1200, amLODipine, aspirin EC, atorvastatin, cetirizine, FREESTYLE LITE, freestyle, metFORMIN, sitaGLIPtin, and cyclobenzaprine.  Pharmacotherapy (Medications Ordered): No orders of the defined types  were placed in this encounter.  Orders:  Orders Placed This Encounter  Procedures  . Lumbar Epidural Injection    Standing Status:   Standing    Number of Occurrences:   9    Standing Expiration Date:   03/27/2020    Scheduling Instructions:     Purpose: Diagnostic     Indication: Lower extremity pain/Sciatica right (M54.31).     Side: Right-sided     Level: L4-5     Sedation: No Sedation.     TIMEFRAME: PRN procedure. (Mr. Brumbaugh will call when needed.)    Order Specific Question:   Where will this procedure be performed?    Answer:   [facility]   Follow-up plan:   Return if symptoms worsen or fail to improve, for PRN Procedure: (R) L4-5 LESI #1, (No sedation).      Interventional management options:  Considering:   Diagnostic right L5-S1 LESI   Diagnostic right-sided L5 foraminal LESI  Diagnostic caudal ESI + diagnostic epidurogram  Diagnosticbilateral lumbar facet nerve block  Possible bilateral lumbar facet RFA  Diagnosticright sacroiliac joint injection  Possible right sacroiliac joint RFA  Diagnostic bilateral intra-articular hip joint injection  Diagnostic bilateral femoral nerve + obturator nerve block   Possible bilateral femoral nerve + obturator nerve RFA    PRN Procedures:   None at this time    Recent Visits No visits were found meeting these conditions.  Showing recent visits within past 90 days and meeting all other requirements   Future Appointments No visits were found meeting these conditions.  Showing future appointments within next 90 days and meeting all other requirements   I discussed the assessment and treatment plan with the patient. The patient was provided an opportunity to ask questions and all were answered. The patient agreed with the plan and demonstrated an understanding of the instructions.  Patient advised to call back or seek an in-person evaluation if the symptoms or condition worsens.  Total duration of non-face-to-face encounter: 18 minutes.  Note by: Gaspar Cola, MD Date: 09/25/2018; Time: 4:42 PM  Note: This dictation was prepared with Dragon dictation. Any transcriptional errors that may result from this process are unintentional.  Disclaimer:  * Given the special circumstances of the COVID-19 pandemic, the federal government has announced that the Office for Civil Rights (OCR) will exercise its enforcement discretion and will not impose penalties on physicians using telehealth in the event of noncompliance with regulatory requirements under the [place] and [place] (HIPAA) in connection with the good faith provision of telehealth during the YSAYT-01 national public health emergency. ([place])

## 2018-09-25 NOTE — Patient Instructions (Signed)

## 2018-09-29 ENCOUNTER — Encounter: Payer: Self-pay | Admitting: Pain Medicine

## 2018-10-18 ENCOUNTER — Other Ambulatory Visit (INDEPENDENT_AMBULATORY_CARE_PROVIDER_SITE_OTHER): Payer: Medicare Other

## 2018-10-18 ENCOUNTER — Other Ambulatory Visit: Payer: Self-pay

## 2018-10-18 DIAGNOSIS — Z23 Encounter for immunization: Secondary | ICD-10-CM

## 2018-10-18 DIAGNOSIS — E559 Vitamin D deficiency, unspecified: Secondary | ICD-10-CM

## 2018-10-18 DIAGNOSIS — Z125 Encounter for screening for malignant neoplasm of prostate: Secondary | ICD-10-CM

## 2018-10-18 DIAGNOSIS — I10 Essential (primary) hypertension: Secondary | ICD-10-CM | POA: Diagnosis not present

## 2018-10-18 DIAGNOSIS — E119 Type 2 diabetes mellitus without complications: Secondary | ICD-10-CM | POA: Diagnosis not present

## 2018-10-18 DIAGNOSIS — Z1329 Encounter for screening for other suspected endocrine disorder: Secondary | ICD-10-CM | POA: Diagnosis not present

## 2018-10-18 LAB — COMPREHENSIVE METABOLIC PANEL
ALT: 28 U/L (ref 0–53)
AST: 23 U/L (ref 0–37)
Albumin: 4.1 g/dL (ref 3.5–5.2)
Alkaline Phosphatase: 72 U/L (ref 39–117)
BUN: 27 mg/dL — ABNORMAL HIGH (ref 6–23)
CO2: 24 mEq/L (ref 19–32)
Calcium: 9.2 mg/dL (ref 8.4–10.5)
Chloride: 104 mEq/L (ref 96–112)
Creatinine, Ser: 1.51 mg/dL — ABNORMAL HIGH (ref 0.40–1.50)
GFR: 46.25 mL/min — ABNORMAL LOW (ref >60.00)
Glucose, Bld: 100 mg/dL — ABNORMAL HIGH (ref 70–99)
Potassium: 4.5 mEq/L (ref 3.5–5.1)
Sodium: 140 mEq/L (ref 135–145)
Total Bilirubin: 0.7 mg/dL (ref 0.2–1.2)
Total Protein: 6.9 g/dL (ref 6.0–8.3)

## 2018-10-18 LAB — CBC WITH DIFFERENTIAL/PLATELET
Basophils Absolute: 0 10*3/uL (ref 0.0–0.1)
Basophils Relative: 0.4 % (ref 0.0–3.0)
Eosinophils Absolute: 0.1 10*3/uL (ref 0.0–0.7)
Eosinophils Relative: 2.4 % (ref 0.0–5.0)
HCT: 36.7 % — ABNORMAL LOW (ref 39.0–52.0)
Hemoglobin: 12.5 g/dL — ABNORMAL LOW (ref 13.0–17.0)
Lymphocytes Relative: 33.6 % (ref 12.0–46.0)
Lymphs Abs: 2.1 10*3/uL (ref 0.7–4.0)
MCHC: 34.1 g/dL (ref 30.0–36.0)
MCV: 86.5 fl (ref 78.0–100.0)
Monocytes Absolute: 0.7 10*3/uL (ref 0.1–1.0)
Monocytes Relative: 10.7 % (ref 3.0–12.0)
Neutro Abs: 3.4 10*3/uL (ref 1.4–7.7)
Neutrophils Relative %: 52.9 % (ref 43.0–77.0)
Platelets: 224 10*3/uL (ref 150.0–400.0)
RBC: 4.24 Mil/uL (ref 4.22–5.81)
RDW: 13.3 % (ref 11.5–15.5)
WBC: 6.4 10*3/uL (ref 4.0–10.5)

## 2018-10-18 LAB — LIPID PANEL
Cholesterol: 96 mg/dL (ref 0–200)
HDL: 31.3 mg/dL — ABNORMAL LOW (ref >39.00)
LDL Cholesterol: 50 mg/dL (ref 0–99)
NonHDL: 64.59
Total CHOL/HDL Ratio: 3
Triglycerides: 73 mg/dL (ref 0.0–149.0)
VLDL: 14.6 mg/dL (ref 0.0–40.0)

## 2018-10-18 LAB — PSA, MEDICARE: PSA: 2.79 ng/ml (ref 0.10–4.00)

## 2018-10-18 LAB — HEMOGLOBIN A1C: Hgb A1c MFr Bld: 7 % — ABNORMAL HIGH (ref 4.6–6.5)

## 2018-10-18 LAB — VITAMIN D 25 HYDROXY (VIT D DEFICIENCY, FRACTURES): VITD: 43.43 ng/mL (ref 30.00–100.00)

## 2018-10-18 LAB — TSH: TSH: 2.2 u[IU]/mL (ref 0.35–4.50)

## 2018-10-18 LAB — MAGNESIUM: Magnesium: 1.3 mg/dL — ABNORMAL LOW (ref 1.5–2.5)

## 2018-10-19 LAB — URINALYSIS, ROUTINE W REFLEX MICROSCOPIC
Bilirubin Urine: NEGATIVE
Glucose, UA: NEGATIVE
Hgb urine dipstick: NEGATIVE
Ketones, ur: NEGATIVE
Leukocytes,Ua: NEGATIVE
Nitrite: NEGATIVE
Protein, ur: NEGATIVE
Specific Gravity, Urine: 1.018 (ref 1.001–1.03)
pH: <=5 (ref 5.0–8.0)

## 2018-10-19 LAB — PROTEIN / CREATININE RATIO, URINE
Creatinine, Urine: 154 mg/dL (ref 20–320)
Protein/Creat Ratio: 65 mg/g creat (ref 22–128)
Protein/Creatinine Ratio: 0.065 mg/mg creat (ref 0.022–0.12)
Total Protein, Urine: 10 mg/dL (ref 5–25)

## 2018-10-22 ENCOUNTER — Other Ambulatory Visit: Payer: Self-pay | Admitting: Internal Medicine

## 2018-10-22 DIAGNOSIS — R799 Abnormal finding of blood chemistry, unspecified: Secondary | ICD-10-CM

## 2018-10-22 DIAGNOSIS — N179 Acute kidney failure, unspecified: Secondary | ICD-10-CM

## 2018-10-22 DIAGNOSIS — R7989 Other specified abnormal findings of blood chemistry: Secondary | ICD-10-CM

## 2018-10-22 DIAGNOSIS — R748 Abnormal levels of other serum enzymes: Secondary | ICD-10-CM

## 2018-11-06 ENCOUNTER — Other Ambulatory Visit: Payer: Self-pay

## 2018-11-06 ENCOUNTER — Other Ambulatory Visit (INDEPENDENT_AMBULATORY_CARE_PROVIDER_SITE_OTHER): Payer: Medicare Other

## 2018-11-06 DIAGNOSIS — R7989 Other specified abnormal findings of blood chemistry: Secondary | ICD-10-CM

## 2018-11-06 DIAGNOSIS — N179 Acute kidney failure, unspecified: Secondary | ICD-10-CM | POA: Diagnosis not present

## 2018-11-06 DIAGNOSIS — R799 Abnormal finding of blood chemistry, unspecified: Secondary | ICD-10-CM | POA: Diagnosis not present

## 2018-11-07 ENCOUNTER — Encounter: Payer: Self-pay | Admitting: Internal Medicine

## 2018-11-13 LAB — PROTEIN ELECTROPHORESIS, SERUM
A/G Ratio: 1.2 (ref 0.7–1.7)
Albumin ELP: 3.7 g/dL (ref 2.9–4.4)
Alpha 1: 0.2 g/dL (ref 0.0–0.4)
Alpha 2: 0.9 g/dL (ref 0.4–1.0)
Beta: 0.8 g/dL (ref 0.7–1.3)
Gamma Globulin: 1.2 g/dL (ref 0.4–1.8)
Globulin, Total: 3.2 g/dL (ref 2.2–3.9)
Total Protein: 6.9 g/dL (ref 6.0–8.5)

## 2018-11-13 LAB — BASIC METABOLIC PANEL
BUN/Creatinine Ratio: 17 (ref 10–24)
BUN: 19 mg/dL (ref 8–27)
CO2: 17 mmol/L — ABNORMAL LOW (ref 20–29)
Calcium: 9.7 mg/dL (ref 8.6–10.2)
Chloride: 105 mmol/L (ref 96–106)
Creatinine, Ser: 1.12 mg/dL (ref 0.76–1.27)
GFR calc Af Amer: 78 mL/min/{1.73_m2} (ref >59)
GFR calc non Af Amer: 68 mL/min/{1.73_m2} (ref >59)
Glucose: 103 mg/dL — ABNORMAL HIGH (ref 65–99)
Potassium: 4.8 mmol/L (ref 3.5–5.2)
Sodium: 141 mmol/L (ref 134–144)

## 2018-11-14 ENCOUNTER — Encounter: Payer: Self-pay | Admitting: Internal Medicine

## 2018-11-21 DIAGNOSIS — B351 Tinea unguium: Secondary | ICD-10-CM | POA: Diagnosis not present

## 2018-11-21 DIAGNOSIS — E1140 Type 2 diabetes mellitus with diabetic neuropathy, unspecified: Secondary | ICD-10-CM | POA: Diagnosis not present

## 2018-11-22 ENCOUNTER — Other Ambulatory Visit: Payer: Medicare Other

## 2018-11-26 ENCOUNTER — Other Ambulatory Visit: Payer: Self-pay

## 2018-11-26 ENCOUNTER — Other Ambulatory Visit: Payer: Medicare Other

## 2018-11-26 DIAGNOSIS — N179 Acute kidney failure, unspecified: Secondary | ICD-10-CM

## 2018-11-26 DIAGNOSIS — R799 Abnormal finding of blood chemistry, unspecified: Secondary | ICD-10-CM

## 2018-11-26 DIAGNOSIS — R7989 Other specified abnormal findings of blood chemistry: Secondary | ICD-10-CM

## 2018-11-26 NOTE — Addendum Note (Signed)
Addended by: Leeanne Rio on: 11/26/2018 10:20 AM   Modules accepted: Orders

## 2018-11-28 LAB — PROTEIN ELECTROPHORESIS, URINE REFLEX
Albumin ELP, Urine: 58.5 %
Alpha-1-Globulin, U: 4.1 %
Alpha-2-Globulin, U: 10.9 %
Beta Globulin, U: 12.7 %
Gamma Globulin, U: 13.8 %
Protein, Ur: 12.6 mg/dL

## 2018-11-28 LAB — SODIUM, URINE, RANDOM: Sodium, Ur: 146 mmol/L

## 2018-12-11 ENCOUNTER — Encounter: Payer: Self-pay | Admitting: Internal Medicine

## 2018-12-11 ENCOUNTER — Ambulatory Visit (INDEPENDENT_AMBULATORY_CARE_PROVIDER_SITE_OTHER): Payer: Medicare Other

## 2018-12-11 ENCOUNTER — Ambulatory Visit (INDEPENDENT_AMBULATORY_CARE_PROVIDER_SITE_OTHER): Payer: Medicare Other | Admitting: Internal Medicine

## 2018-12-11 ENCOUNTER — Other Ambulatory Visit: Payer: Self-pay

## 2018-12-11 VITALS — Ht 69.0 in | Wt 176.0 lb

## 2018-12-11 DIAGNOSIS — Z1159 Encounter for screening for other viral diseases: Secondary | ICD-10-CM | POA: Diagnosis not present

## 2018-12-11 DIAGNOSIS — D649 Anemia, unspecified: Secondary | ICD-10-CM

## 2018-12-11 DIAGNOSIS — E119 Type 2 diabetes mellitus without complications: Secondary | ICD-10-CM | POA: Diagnosis not present

## 2018-12-11 DIAGNOSIS — Z0000 Encounter for general adult medical examination without abnormal findings: Secondary | ICD-10-CM | POA: Diagnosis not present

## 2018-12-11 DIAGNOSIS — Z13818 Encounter for screening for other digestive system disorders: Secondary | ICD-10-CM

## 2018-12-11 NOTE — Progress Notes (Signed)
Telephone Note  I connected with Shirlyn Goltz  on 12/11/18 at 11:20 AM EST by a telephone and verified that I am speaking with the correct person using two identifiers.  Location patient: home Location provider:work or home office Persons participating in the virtual visit: patient, provider, pts wife  I discussed the limitations of evaluation and management by telemedicine and the availability of in person appointments. The patient expressed understanding and agreed to proceed.   HPI: 1. DM 2 A1C 7.0 on metformin 1000 mg bid, januvia 100 mg qd at goal  2. SPEP/UPEP labs normal    ROS: See pertinent positives and negatives per HPI.  Past Medical History:  Diagnosis Date  . Allergy   . Chicken pox   . Colon polyps   . Diabetes mellitus without complication ([place])   . Hyperlipidemia   . Hypertension     Past Surgical History:  Procedure Laterality Date  . TONSILLECTOMY      Family History  Problem Relation Age of Onset  . Cancer Mother        bone, liver, lung former smoker ? lung died 18-May-2008  . Diabetes Mother   . Hypertension Mother   . Atrial fibrillation Sister     SOCIAL HX: married with kids    Current Outpatient Medications:  .  amLODipine (NORVASC) 2.5 MG tablet, Take 1 tablet (2.5 mg total) by mouth daily., Disp: 90 tablet, Rfl: 3 .  aspirin EC 81 MG tablet, Take 1 tablet (81 mg total) by mouth daily., Disp: 90 tablet, Rfl: 3 .  atorvastatin (LIPITOR) 20 MG tablet, Take 1 tablet (20 mg total) by mouth daily at 6 PM., Disp: 90 tablet, Rfl: 3 .  cetirizine (ZYRTEC) 10 MG tablet, Take 1 tablet (10 mg total) by mouth daily., Disp: 90 tablet, Rfl: 3 .  Cinnamon 500 MG TABS, Take by mouth., Disp: , Rfl:  .  cyclobenzaprine (FLEXERIL) 5 MG tablet, Take 1-2 tablets (5-10 mg total) by mouth at bedtime as needed for muscle spasms., Disp: 60 tablet, Rfl: 2 .  glucose blood (FREESTYLE LITE) test strip, Use as instructed qd to bid E11.9, Disp: 180 each, Rfl: 12 .  Lancets  (FREESTYLE) lancets, Qd to bid Use as instructed E11.9, Disp: 200 each, Rfl: 3 .  metFORMIN (GLUCOPHAGE) 1000 MG tablet, Take 1 tablet (1,000 mg total) by mouth 2 (two) times daily with a meal., Disp: 180 tablet, Rfl: 3 .  Multiple Vitamin (MULTIVITAMIN) tablet, Take 1 tablet by mouth daily., Disp: , Rfl:  .  sitaGLIPtin (JANUVIA) 100 MG tablet, Take 1 tablet (100 mg total) by mouth daily., Disp: 90 tablet, Rfl: 3 .  Calcium Carbonate-Vit D-Min (GNP CALCIUM 1200) 1200-1000 MG-UNIT CHEW, Chew 1,200 mg by mouth daily with breakfast. Take in combination with vitamin D and magnesium., Disp: 30 tablet, Rfl: 5  EXAM:  VITALS per patient if applicable:  GENERAL: alert, oriented, appears well and in no acute distress  PSYCH/NEURO: pleasant and cooperative, no obvious depression or anxiety, speech and thought processing grossly intact  ASSESSMENT AND PLAN:  Discussed the following assessment and plan:  Type 2 diabetes mellitus without complication, without long-term current use of insulin ([place]) - Plan: Comprehensive metabolic panel, CBC with Differential/Platelet, Lipid panel, Hemoglobin A1c  Encounter for screening for other digestive system disorders - Plan: Hepatitis C antibody  Anemia, unspecified type - Plan: CBC with Differential/Platelet  -cont meds  Reviewed labs kidney function improved  -will do pna 23 vaccine in the future with  upcoming labs  hM Flu shotutd Tdap utd  prevnar rec and will need pna 23 in 1 year [place]   had 2/2 shingrix vaccines   DRE in futurePSA 2.15 10/16/17  colonoscopy had 11/23/15 moderate to severe diverticulosis 2 polyps no pathology report noted Dr. Rexene Alberts [facility] requested recordsfor pathology report -rec repeat in 5 years with h/o polyps Declines hep B/C testing prev  -agreeable for now   dermatologysawDr. Kellie Moor tbse multiple nevidue to see again 1/9/2020saw cyst removal  back 03/07/2018  -we discussed possible serious and likely etiologies, options for evaluation and workup, limitations of telemedicine visit vs in person visit, treatment, treatment risks and precautions. Pt prefers to treat via telemedicine empirically rather then risking or undertaking an in person visit at this moment. Patient agrees to seek prompt in person care if worsening, new symptoms arise, or if is not improving with treatment.   I discussed the assessment and treatment plan with the patient. The patient was provided an opportunity to ask questions and all were answered. The patient agreed with the plan and demonstrated an understanding of the instructions.   The patient was advised to call back or seek an in-person evaluation if the symptoms worsen or if the condition fails to improve as anticipated.  Time spent 15 minutes Delorise Jackson, MD

## 2018-12-11 NOTE — Progress Notes (Signed)
Agree   TMS 

## 2018-12-11 NOTE — Progress Notes (Signed)
Subjective:   Andrew Gonzalez is a 68 y.o. male who presents for Medicare Annual/Subsequent preventive examination.  Review of Systems:  No ROS.  Medicare Wellness Virtual Visit.  Visual/audio telehealth visit, UTA vital signs.   See social history for additional risk factors.   Cardiac Risk Factors include: advanced age (>60men, >67 women);male gender;hypertension;diabetes mellitus     Objective:    Vitals: There were no vitals taken for this visit.  There is no height or weight on file to calculate BMI.  Advanced Directives 12/11/2018 01/08/2018 12/07/2017 11/07/2017  Does Patient Have a Medical Advance Directive? Yes No No No  Type of Paramedic of [place];Living will - - -  Does patient want to make changes to medical advance directive? No - Patient declined - - -  Copy of Andrew Gonzalez in Chart? No - copy requested - - -  Would patient like information on creating a medical advance directive? - No - Patient declined No - Patient declined Yes (MAU/Ambulatory/Procedural Areas - Information given)    Tobacco Social History   Tobacco Use  Smoking Status Former Smoker  Smokeless Tobacco Never Used     Counseling given: Not Answered   Clinical Intake:  Pre-visit preparation completed: Yes        Diabetes: Yes(Followed by pcp)  How often do you need to have someone help you when you read instructions, pamphlets, or other written materials from your doctor or pharmacy?: 1 - Never  Interpreter Needed?: No     Past Medical History:  Diagnosis Date  . Allergy   . Chicken pox   . Colon polyps   . Diabetes mellitus without complication ([place])   . Hyperlipidemia   . Hypertension    Past Surgical History:  Procedure Laterality Date  . TONSILLECTOMY     Family History  Problem Relation Age of Onset  . Cancer Mother        bone, liver, lung former smoker ? lung died 25-May-2008  . Diabetes Mother   . Hypertension Mother   . Atrial  fibrillation Sister    Social History   Socioeconomic History  . Marital status: Married    Spouse name: Not on file  . Number of children: Not on file  . Years of education: Not on file  . Highest education level: Not on file  Occupational History  . Not on file  Social Needs  . Financial resource strain: Not hard at all  . Food insecurity    Worry: Never true    Inability: Never true  . Transportation needs    Medical: No    Non-medical: No  Tobacco Use  . Smoking status: Former Research scientist (life sciences)  . Smokeless tobacco: Never Used  Substance and Sexual Activity  . Alcohol use: Yes    Alcohol/week: 1.0 standard drinks    Types: 1 Standard drinks or equivalent per week    Frequency: Never    Comment: occasional, maybe once a week  . Drug use: No  . Sexual activity: Yes    Partners: Female  Lifestyle  . Physical activity    Days per week: 3 days    Minutes per session: 30 min  . Stress: Not at all  Relationships  . Social Herbalist on phone: Not on file    Gets together: Not on file    Attends religious service: Not on file    Active member of club or organization: Not on file  Attends meetings of clubs or organizations: Not on file    Relationship status: Married  Other Topics Concern  . Not on file  Social History Narrative   Wears selt belt    Safe in relationship    Recently moved from [facility] 2018/19 originally from [facility] dad and sister 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    Retired Chartered certified accountant    Masters degree     Outpatient Encounter Medications as of 12/11/2018  Medication Sig  . amLODipine (NORVASC) 2.5 MG tablet Take 1 tablet (2.5 mg total) by mouth daily.  Marland Kitchen aspirin EC 81 MG tablet Take 1 tablet (81 mg total) by mouth daily.  Marland Kitchen atorvastatin (LIPITOR) 20 MG tablet Take 1 tablet (20 mg total) by mouth daily at 6 PM.  . Calcium Carbonate-Vit D-Min (GNP CALCIUM 1200) 1200-1000 MG-UNIT CHEW Chew 1,200 mg by mouth daily with breakfast. Take in combination with  vitamin D and magnesium.  . cetirizine (ZYRTEC) 10 MG tablet Take 1 tablet (10 mg total) by mouth daily.  . Cinnamon 500 MG TABS Take by mouth.  . cyclobenzaprine (FLEXERIL) 5 MG tablet Take 1-2 tablets (5-10 mg total) by mouth at bedtime as needed for muscle spasms.  Marland Kitchen glucose blood (FREESTYLE LITE) test strip Use as instructed qd to bid E11.9  . Lancets (FREESTYLE) lancets Qd to bid Use as instructed E11.9  . metFORMIN (GLUCOPHAGE) 1000 MG tablet Take 1 tablet (1,000 mg total) by mouth 2 (two) times daily with a meal.  . Multiple Vitamin (MULTIVITAMIN) tablet Take 1 tablet by mouth daily.  . sitaGLIPtin (JANUVIA) 100 MG tablet Take 1 tablet (100 mg total) by mouth daily.   No facility-administered encounter medications on file as of 12/11/2018.     Activities of Daily Living In your present state of health, do you have any difficulty performing the following activities: 12/11/2018  Hearing? N  Vision? N  Difficulty concentrating or making decisions? N  Walking or climbing stairs? N  Dressing or bathing? N  Doing errands, shopping? N  Preparing Food and eating ? N  Using the Toilet? N  In the past six months, have you accidently leaked urine? N  Do you have problems with loss of bowel control? N  Managing your Medications? N  Managing your Finances? N  Housekeeping or managing your Housekeeping? N  Some recent data might be hidden    Patient Care Team: McLean-Scocuzza, Nino Glow, MD as PCP - General (Internal Medicine)   Assessment:   This is a routine wellness examination for Andrew Gonzalez.  Nurse connected with patient 12/11/18 at 10:30 AM EST by a telephone enabled telemedicine application and verified that I am speaking with the correct person using two identifiers. Patient stated full name and DOB. Patient gave permission to continue with virtual visit. Patient's location was at home and Nurse's location was at [place] office.   Health Maintenance Due: -Hepatitis C screening-  discussed, consent given.  -PNA - discussed; to be completed with doctor in visit or local pharmacy.  -Foot exam- Podiatry scheduled every 3 months. Next is 02/2019, Dr. Cleda Mccreedy. -Hgb A1c- 10/18/18 (7.0)  Update all pending maintenance due as appropriate.   See completed HM at the end of note.   Eye: Visual acuity not assessed. Virtual visit. Wears corrective lenses. Followed by their ophthalmologist every 12 months.  Retinopathy- none reported  Dental: Visits every 6 months.    Hearing: Demonstrates normal hearing during visit.  Safety:  Patient feels safe at home- yes Patient does  have smoke detectors at home- yes Patient does wear sunscreen or protective clothing when in direct sunlight - yes Patient does wear seat belt when in a moving vehicle - yes Patient drives- yes Adequate lighting in walkways free from debris- yes Grab bars and handrails used as appropriate- yes Ambulates with no assistive device Cell phone on person when ambulating outside of the home- yes  Social: Alcohol intake - yes      Smoking history- former    Smokers in home? none Illicit drug use? none  Depression: PHQ 2 &9 complete. See screening below. Denies irritability, anhedonia, sadness/tearfullness.    Falls: See screening below.    Medication: Taking as directed and without issues.   Covid-19: Precautions and sickness symptoms discussed. Wears mask, social distancing, hand hygiene as appropriate.   Activities of Daily Living Patient denies needing assistance with: household chores, feeding themselves, getting from bed to chair, getting to the toilet, bathing/showering, dressing, managing money, or preparing meals.   Memory: Patient is alert. Patient denies difficulty focusing or concentrating. Correctly identified the president of the [place], season and recall. Patient likes to play computer games and complete crossword puzzles for brain stimulation.   BMI- discussed the importance of a  healthy diet, water intake and the benefits of aerobic exercise.  Educational material provided.  Physical activity- walking, no routine  Diet:  Regular Water: good intake  Other Providers Patient Care Team: McLean-Scocuzza, Nino Glow, MD as PCP - General (Internal Medicine)  Exercise Activities and Dietary recommendations    Goals      Patient Stated   . Weight 165lb (pt-stated)       Fall Risk Fall Risk  12/11/2018 01/22/2018 01/08/2018 12/07/2017 12/05/2017  Falls in the past year? 0 0 0 No No  Number falls in past yr: 0 - - - -  Follow up Falls evaluation completed - - - -   Timed Get Up and Go Performed: no, virtual visit  Depression Screen PHQ 2/9 Scores 12/11/2018 01/22/2018 12/07/2017 11/07/2017  PHQ - 2 Score 0 0 0 0    Cognitive Function MMSE - Mini Mental State Exam 12/07/2017  Orientation to time 5  Orientation to Place 5  Registration 3  Attention/ Calculation 5  Recall 3  Language- name 2 objects 2  Language- repeat 1  Language- follow 3 step command 3  Language- read & follow direction 1  Write a sentence 1  Copy design 1  Total score 30     6CIT Screen 12/11/2018  What Year? 0 points  What month? 0 points  What time? 0 points  Count back from 20 0 points  Months in reverse 0 points  Repeat phrase 0 points  Total Score 0    Immunization History  Administered Date(s) Administered  . Fluad Quad(high Dose 65+) 10/18/2018  . Influenza, High Dose Seasonal PF 10/16/2017  . Influenza-Unspecified 12/22/2016  . Pneumococcal Conjugate-13 10/18/2017  . Tdap 05/04/2017  . Zoster 08/27/2017  . Zoster Recombinat (Shingrix) 08/27/2017, 11/30/2017     Screening Tests Health Maintenance  Topic Date Due  . Hepatitis C Screening  1950/03/15  . PNA vac Low Risk Adult (2 of 2 - PPSV23) 10/19/2018  . HEMOGLOBIN A1C  04/17/2019  . URINE MICROALBUMIN  10/18/2019  . OPHTHALMOLOGY EXAM  11/15/2019  . FOOT EXAM  11/19/2019  . COLONOSCOPY  11/22/2025  .  TETANUS/TDAP  05/05/2027  . INFLUENZA VACCINE  Completed       Plan:   Keep  all routine maintenance appointments.   Follow up today with your doctor today @ 11:00  Medicare Attestation I have personally reviewed: The patient's medical and social history Their use of alcohol, tobacco or illicit drugs Their current medications and supplements The patient's functional ability including ADLs,fall risks, home safety risks, cognitive, and hearing and visual impairment Diet and physical activities Evidence for depression   In addition, I have reviewed and discussed with patient certain preventive protocols, quality metrics, and best practice recommendations. A written personalized care plan for preventive services as well as general preventive health recommendations were provided to patient via mail.     Varney Biles, LPN  [id number]

## 2018-12-11 NOTE — Patient Instructions (Addendum)
  Andrew Gonzalez , Thank you for taking time to come for your Medicare Wellness Visit. I appreciate your ongoing commitment to your health goals. Please review the following plan we discussed and let me know if I can assist you in the future.   These are the goals we discussed: Goals      Patient Stated   . Weight 165lb (pt-stated)       This is a list of the screening recommended for you and due dates:  Health Maintenance  Topic Date Due  .  Hepatitis C: One time screening is recommended by Center for Disease Control  (CDC) for  adults born from [birth year] through 1964.   05-Jun-1950  . Pneumonia vaccines (2 of 2 - PPSV23) 10/19/2018  . Hemoglobin A1C  04/17/2019  . Urine Protein Check  10/18/2019  . Eye exam for diabetics  11/15/2019  . Complete foot exam   11/19/2019  . Colon Cancer Screening  11/22/2025  . Tetanus Vaccine  05/05/2027  . Flu Shot  Completed

## 2019-02-13 DIAGNOSIS — D2261 Melanocytic nevi of right upper limb, including shoulder: Secondary | ICD-10-CM | POA: Diagnosis not present

## 2019-02-13 DIAGNOSIS — D2262 Melanocytic nevi of left upper limb, including shoulder: Secondary | ICD-10-CM | POA: Diagnosis not present

## 2019-02-13 DIAGNOSIS — D2271 Melanocytic nevi of right lower limb, including hip: Secondary | ICD-10-CM | POA: Diagnosis not present

## 2019-02-13 DIAGNOSIS — D2272 Melanocytic nevi of left lower limb, including hip: Secondary | ICD-10-CM | POA: Diagnosis not present

## 2019-02-13 DIAGNOSIS — D225 Melanocytic nevi of trunk: Secondary | ICD-10-CM | POA: Diagnosis not present

## 2019-02-13 DIAGNOSIS — L821 Other seborrheic keratosis: Secondary | ICD-10-CM | POA: Diagnosis not present

## 2019-02-20 DIAGNOSIS — B351 Tinea unguium: Secondary | ICD-10-CM | POA: Diagnosis not present

## 2019-02-20 DIAGNOSIS — E1140 Type 2 diabetes mellitus with diabetic neuropathy, unspecified: Secondary | ICD-10-CM | POA: Diagnosis not present

## 2019-04-17 ENCOUNTER — Other Ambulatory Visit: Payer: Medicare Other

## 2019-04-17 ENCOUNTER — Ambulatory Visit: Payer: Medicare Other

## 2019-04-19 ENCOUNTER — Other Ambulatory Visit (INDEPENDENT_AMBULATORY_CARE_PROVIDER_SITE_OTHER): Payer: Medicare Other

## 2019-04-19 ENCOUNTER — Other Ambulatory Visit: Payer: Self-pay

## 2019-04-19 DIAGNOSIS — E119 Type 2 diabetes mellitus without complications: Secondary | ICD-10-CM | POA: Diagnosis not present

## 2019-04-19 DIAGNOSIS — Z13818 Encounter for screening for other digestive system disorders: Secondary | ICD-10-CM

## 2019-04-19 DIAGNOSIS — D649 Anemia, unspecified: Secondary | ICD-10-CM

## 2019-04-19 LAB — CBC WITH DIFFERENTIAL/PLATELET
Basophils Absolute: 0.1 10*3/uL (ref 0.0–0.1)
Basophils Relative: 0.7 % (ref 0.0–3.0)
Eosinophils Absolute: 0.1 10*3/uL (ref 0.0–0.7)
Eosinophils Relative: 1.3 % (ref 0.0–5.0)
HCT: 39.8 % (ref 39.0–52.0)
Hemoglobin: 13.4 g/dL (ref 13.0–17.0)
Lymphocytes Relative: 22.3 % (ref 12.0–46.0)
Lymphs Abs: 1.8 10*3/uL (ref 0.7–4.0)
MCHC: 33.6 g/dL (ref 30.0–36.0)
MCV: 89.5 fl (ref 78.0–100.0)
Monocytes Absolute: 1 10*3/uL (ref 0.1–1.0)
Monocytes Relative: 11.7 % (ref 3.0–12.0)
Neutro Abs: 5.3 10*3/uL (ref 1.4–7.7)
Neutrophils Relative %: 64 % (ref 43.0–77.0)
Platelets: 248 10*3/uL (ref 150.0–400.0)
RBC: 4.44 Mil/uL (ref 4.22–5.81)
RDW: 12.7 % (ref 11.5–15.5)
WBC: 8.2 10*3/uL (ref 4.0–10.5)

## 2019-04-19 LAB — COMPREHENSIVE METABOLIC PANEL
ALT: 21 U/L (ref 0–53)
AST: 22 U/L (ref 0–37)
Albumin: 4.3 g/dL (ref 3.5–5.2)
Alkaline Phosphatase: 83 U/L (ref 39–117)
BUN: 19 mg/dL (ref 6–23)
CO2: 25 mEq/L (ref 19–32)
Calcium: 9.4 mg/dL (ref 8.4–10.5)
Chloride: 101 mEq/L (ref 96–112)
Creatinine, Ser: 1.18 mg/dL (ref 0.40–1.50)
GFR: 61.39 mL/min (ref >60.00)
Glucose, Bld: 138 mg/dL — ABNORMAL HIGH (ref 70–99)
Potassium: 4.3 mEq/L (ref 3.5–5.1)
Sodium: 135 mEq/L (ref 135–145)
Total Bilirubin: 1 mg/dL (ref 0.2–1.2)
Total Protein: 7.6 g/dL (ref 6.0–8.3)

## 2019-04-19 LAB — LIPID PANEL
Cholesterol: 104 mg/dL (ref 0–200)
HDL: 35.2 mg/dL — ABNORMAL LOW (ref >39.00)
LDL Cholesterol: 41 mg/dL (ref 0–99)
NonHDL: 68.82
Total CHOL/HDL Ratio: 3
Triglycerides: 137 mg/dL (ref 0.0–149.0)
VLDL: 27.4 mg/dL (ref 0.0–40.0)

## 2019-04-19 LAB — HEMOGLOBIN A1C: Hgb A1c MFr Bld: 6.9 % — ABNORMAL HIGH (ref 4.6–6.5)

## 2019-04-22 LAB — HEPATITIS C ANTIBODY
Hepatitis C Ab: NONREACTIVE
SIGNAL TO CUT-OFF: 0.01 (ref <1.00)

## 2019-04-24 ENCOUNTER — Ambulatory Visit: Payer: Medicare Other | Admitting: Internal Medicine

## 2019-05-08 ENCOUNTER — Ambulatory Visit (INDEPENDENT_AMBULATORY_CARE_PROVIDER_SITE_OTHER): Payer: Medicare Other | Admitting: Internal Medicine

## 2019-05-08 ENCOUNTER — Other Ambulatory Visit: Payer: Self-pay

## 2019-05-08 ENCOUNTER — Ambulatory Visit (INDEPENDENT_AMBULATORY_CARE_PROVIDER_SITE_OTHER): Payer: Medicare Other

## 2019-05-08 ENCOUNTER — Encounter: Payer: Self-pay | Admitting: Internal Medicine

## 2019-05-08 VITALS — BP 138/78 | HR 88 | Temp 97.4°F | Ht 69.0 in | Wt 184.6 lb

## 2019-05-08 DIAGNOSIS — E119 Type 2 diabetes mellitus without complications: Secondary | ICD-10-CM | POA: Diagnosis not present

## 2019-05-08 DIAGNOSIS — M25561 Pain in right knee: Secondary | ICD-10-CM | POA: Diagnosis not present

## 2019-05-08 DIAGNOSIS — G8929 Other chronic pain: Secondary | ICD-10-CM

## 2019-05-08 DIAGNOSIS — E785 Hyperlipidemia, unspecified: Secondary | ICD-10-CM

## 2019-05-08 DIAGNOSIS — Z1329 Encounter for screening for other suspected endocrine disorder: Secondary | ICD-10-CM | POA: Diagnosis not present

## 2019-05-08 DIAGNOSIS — Z125 Encounter for screening for malignant neoplasm of prostate: Secondary | ICD-10-CM

## 2019-05-08 DIAGNOSIS — I10 Essential (primary) hypertension: Secondary | ICD-10-CM | POA: Diagnosis not present

## 2019-05-08 DIAGNOSIS — E559 Vitamin D deficiency, unspecified: Secondary | ICD-10-CM | POA: Diagnosis not present

## 2019-05-08 DIAGNOSIS — H9190 Unspecified hearing loss, unspecified ear: Secondary | ICD-10-CM

## 2019-05-08 DIAGNOSIS — M25562 Pain in left knee: Secondary | ICD-10-CM

## 2019-05-08 NOTE — Patient Instructions (Addendum)
voltaren gel 4x per day  Petra Kuba wise ginger/tumeric/curcumin or nature made  Tylenol  Pneumonia 23 vaccine is due call back after 05/18/19     Arthritis Arthritis is a term that is commonly used to refer to joint pain or joint disease. There are more than 100 types of arthritis. What are the causes? The most common cause of this condition is wear and tear of a joint. Other causes include:  Gout.  Inflammation of a joint.  An infection of a joint.  Sprains and other injuries near the joint.  A reaction to medicines or drugs, or an allergic reaction. In some cases, the cause may not be known. What are the signs or symptoms? The main symptom of this condition is pain in the joint during movement. Other symptoms include:  Redness, swelling, or stiffness at a joint.  Warmth coming from the joint.  Fever.  Overall feeling of illness. How is this diagnosed? This condition may be diagnosed with a physical exam and tests, including:  Blood tests.  Urine tests.  Imaging tests, such as X-rays, an MRI, or a CT scan. Sometimes, fluid is removed from a joint for testing. How is this treated? This condition may be treated with:  Treatment of the cause, if it is known.  Rest.  Raising (elevating) the joint.  Applying cold or hot packs to the joint.  Medicines to improve symptoms and reduce inflammation.  Injections of a steroid such as cortisone into the joint to help reduce pain and inflammation. Depending on the cause of your arthritis, you may need to make lifestyle changes to reduce stress on your joint. Changes may include:  Exercising more.  Losing weight. Follow these instructions at home: Medicines  Take over-the-counter and prescription medicines only as told by your health care provider.  Do not take aspirin to relieve pain if your health care provider thinks that gout may be causing your pain. Activity  Rest your joint if told by your health care  provider. Rest is important when your disease is active and your joint feels painful, swollen, or stiff.  Avoid activities that make the pain worse. It is important to balance activity with rest.  Exercise your joint regularly with range-of-motion exercises as told by your health care provider. Try doing low-impact exercise, such as: ? Swimming. ? Water aerobics. ? Biking. ? Walking. Managing pain, stiffness, and swelling      If directed, put ice on the joint. ? Put ice in a plastic bag. ? Place a towel between your skin and the bag. ? Leave the ice on for 20 minutes, 2-3 times per day.  If your joint is swollen, raise (elevate) it above the level of your heart if directed by your health care provider.  If your joint feels stiff in the morning, try taking a warm shower.  If directed, apply heat to the affected area as often as told by your health care provider. Use the heat source that your health care provider recommends, such as a moist heat pack or a heating pad. If you have diabetes, do not apply heat without permission from your health care provider. To apply heat: ? Place a towel between your skin and the heat source. ? Leave the heat on for 20-30 minutes. ? Remove the heat if your skin turns bright red. This is especially important if you are unable to feel pain, heat, or cold. You may have a greater risk of getting burned. General instructions  Do  not use any products that contain nicotine or tobacco, such as cigarettes, e-cigarettes, and chewing tobacco. If you need help quitting, ask your health care provider.  Keep all follow-up visits as told by your health care provider. This is important. Contact a health care provider if:  The pain gets worse.  You have a fever. Get help right away if:  You develop severe joint pain, swelling, or redness.  Many joints become painful and swollen.  You develop severe back pain.  You develop severe weakness in your  leg.  You cannot control your bladder or bowels. Summary  Arthritis is a term that is commonly used to refer to joint pain or joint disease. There are more than 100 types of arthritis.  The most common cause of this condition is wear and tear of a joint. Other causes include gout, inflammation or infection of the joint, sprains, or allergies.  Symptoms of this condition include redness, swelling, or stiffness of the joint. Other symptoms include warmth, fever, or feeling ill.  This condition is treated with rest, elevation, medicines, and applying cold or hot packs.  Follow your health care provider's instructions about medicines, activity, exercises, and other home care treatments. This information is not intended to replace advice given to you by your health care provider. Make sure you discuss any questions you have with your health care provider. Document Revised: 01/01/2018 Document Reviewed: 01/01/2018 Elsevier Patient Education  2020 [employer].

## 2019-05-08 NOTE — Progress Notes (Signed)
Chief Complaint  Patient presents with  . Follow-up   F/u  1. HTN on norvasc 2.5 mg qd BP at home 120s/70s will monitor  2. DM 2 A1C 6.9 improved and HLD improved on metformin1000 and lipitor 20 mg qhs and januvia 100 mg qd 3. C/o b/l knee pain 3/4 intermittent nothing tried worse with weather changes if needed ortho would want [facility] Dr. Harlow Mares  Review of Systems  Constitutional: Negative for weight loss.  HENT: Negative for hearing loss.   Eyes: Negative for blurred vision.  Respiratory: Negative for wheezing.   Cardiovascular: Negative for chest pain.  Gastrointestinal: Negative for abdominal pain and blood in stool.  Musculoskeletal: Positive for joint pain. Negative for falls.  Skin: Negative for rash.  Neurological: Negative for headaches.  Psychiatric/Behavioral: Negative for memory loss.   Past Medical History:  Diagnosis Date  . Allergy   . Chicken pox   . Colon polyps   . Diabetes mellitus without complication ([place])   . Hyperlipidemia   . Hypertension    Past Surgical History:  Procedure Laterality Date  . TONSILLECTOMY     Family History  Problem Relation Age of Onset  . Cancer Mother        bone, liver, lung former smoker ? lung died 01-May-2008  . Diabetes Mother   . Hypertension Mother   . Atrial fibrillation Sister    Social History   Socioeconomic History  . Marital status: Married    Spouse name: Not on file  . Number of children: Not on file  . Years of education: Not on file  . Highest education level: Not on file  Occupational History  . Not on file  Tobacco Use  . Smoking status: Former Research scientist (life sciences)  . Smokeless tobacco: Never Used  Substance and Sexual Activity  . Alcohol use: Yes    Alcohol/week: 1.0 standard drinks    Types: 1 Standard drinks or equivalent per week    Comment: occasional, maybe once a week  . Drug use: No  . Sexual activity: Yes    Partners: Female  Other Topics Concern  . Not on file  Social History Narrative   Wears  selt belt    Safe in relationship    Recently moved from [facility] 2018/19 originally from [facility] dad and sister 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    Retired Occupational hygienist degree    Social Determinants of Health   Financial Resource Strain:   . Difficulty of Paying Living Expenses:   Food Insecurity:   . Worried About Charity fundraiser in the Last Year:   . Arboriculturist in the Last Year:   Transportation Needs:   . Film/video editor (Medical):   Marland Kitchen Lack of Transportation (Non-Medical):   Physical Activity:   . Days of Exercise per Week:   . Minutes of Exercise per Session:   Stress:   . Feeling of Stress :   Social Connections:   . Frequency of Communication with Friends and Family:   . Frequency of Social Gatherings with Friends and Family:   . Attends Religious Services:   . Active Member of Clubs or Organizations:   . Attends Archivist Meetings:   Marland Kitchen Marital Status:   Intimate Partner Violence:   . Fear of Current or Ex-Partner:   . Emotionally Abused:   Marland Kitchen Physically Abused:   . Sexually Abused:    Current Meds  Medication Sig  . amLODipine (NORVASC)  2.5 MG tablet Take 1 tablet (2.5 mg total) by mouth daily.  Marland Kitchen aspirin EC 81 MG tablet Take 1 tablet (81 mg total) by mouth daily.  Marland Kitchen atorvastatin (LIPITOR) 20 MG tablet Take 1 tablet (20 mg total) by mouth daily at 6 PM.  . cetirizine (ZYRTEC) 10 MG tablet Take 1 tablet (10 mg total) by mouth daily.  . cholecalciferol (VITAMIN D3) 25 MCG (1000 UNIT) tablet Take 1,000 Units by mouth daily.  . Cinnamon 500 MG TABS Take by mouth.  . cyclobenzaprine (FLEXERIL) 5 MG tablet Take 1-2 tablets (5-10 mg total) by mouth at bedtime as needed for muscle spasms.  Marland Kitchen glucose blood (FREESTYLE LITE) test strip Use as instructed qd to bid E11.9  . Lancets (FREESTYLE) lancets Qd to bid Use as instructed E11.9  . metFORMIN (GLUCOPHAGE) 1000 MG tablet Take 1 tablet (1,000 mg total) by mouth 2 (two) times daily with a meal.  .  Multiple Vitamin (MULTIVITAMIN) tablet Take 1 tablet by mouth daily.  . sitaGLIPtin (JANUVIA) 100 MG tablet Take 1 tablet (100 mg total) by mouth daily.   Allergies  Allergen Reactions  . Bactrim [Sulfamethoxazole-Trimethoprim]     Red face   . Biofreeze [Menthol (Topical Analgesic)]     Rash    Recent Results (from the past 2160 hour(s))  Hepatitis C antibody     Status: None   Collection Time: 04/19/19  8:00 AM  Result Value Ref Range   Hepatitis C Ab NON-REACTIVE NON-REACTI   SIGNAL TO CUT-OFF 0.01 <1.00    Comment: . HCV antibody was non-reactive. There is no laboratory  evidence of HCV infection. . In most cases, no further action is required. However, if recent HCV exposure is suspected, a test for HCV RNA (test code [phone]) is suggested. . For additional information please refer to http://education.questdiagnostics.com/faq/FAQ22v1 (This link is being provided for informational/ educational purposes only.) .   Hemoglobin A1c     Status: Abnormal   Collection Time: 04/19/19  8:00 AM  Result Value Ref Range   Hgb A1c MFr Bld 6.9 (H) 4.6 - 6.5 %    Comment: Glycemic Control Guidelines for People with Diabetes:Non Diabetic:  <6%Goal of Therapy: <7%Additional Action Suggested:  >8%   Lipid panel     Status: Abnormal   Collection Time: 04/19/19  8:00 AM  Result Value Ref Range   Cholesterol 104 0 - 200 mg/dL    Comment: ATP III Classification       Desirable:  < 200 mg/dL               Borderline High:  200 - 239 mg/dL          High:  > = 240 mg/dL   Triglycerides 137.0 0.0 - 149.0 mg/dL    Comment: Normal:  <150 mg/dLBorderline High:  150 - 199 mg/dL   HDL 35.20 (L) >39.00 mg/dL   VLDL 27.4 0.0 - 40.0 mg/dL   LDL Cholesterol 41 0 - 99 mg/dL   Total CHOL/HDL Ratio 3     Comment:                Men          Women1/2 Average Risk     3.4          3.3Average Risk          5.0          4.42X Average Risk          9.6  7.13X Average Risk          15.0          11.0                        NonHDL 68.82     Comment: NOTE:  Non-HDL goal should be 30 mg/dL higher than patient's LDL goal (i.e. LDL goal of < 70 mg/dL, would have non-HDL goal of < 100 mg/dL)  CBC with Differential/Platelet     Status: None   Collection Time: 04/19/19  8:00 AM  Result Value Ref Range   WBC 8.2 4.0 - 10.5 K/uL   RBC 4.44 4.22 - 5.81 Mil/uL   Hemoglobin 13.4 13.0 - 17.0 g/dL   HCT 39.8 39.0 - 52.0 %   MCV 89.5 78.0 - 100.0 fl   MCHC 33.6 30.0 - 36.0 g/dL   RDW 12.7 11.5 - 15.5 %   Platelets 248.0 150.0 - 400.0 K/uL   Neutrophils Relative % 64.0 43.0 - 77.0 %   Lymphocytes Relative 22.3 12.0 - 46.0 %   Monocytes Relative 11.7 3.0 - 12.0 %   Eosinophils Relative 1.3 0.0 - 5.0 %   Basophils Relative 0.7 0.0 - 3.0 %   Neutro Abs 5.3 1.4 - 7.7 K/uL   Lymphs Abs 1.8 0.7 - 4.0 K/uL   Monocytes Absolute 1.0 0.1 - 1.0 K/uL   Eosinophils Absolute 0.1 0.0 - 0.7 K/uL   Basophils Absolute 0.1 0.0 - 0.1 K/uL  Comprehensive metabolic panel     Status: Abnormal   Collection Time: 04/19/19  8:00 AM  Result Value Ref Range   Sodium 135 135 - 145 mEq/L   Potassium 4.3 3.5 - 5.1 mEq/L   Chloride 101 96 - 112 mEq/L   CO2 25 19 - 32 mEq/L   Glucose, Bld 138 (H) 70 - 99 mg/dL   BUN 19 6 - 23 mg/dL   Creatinine, Ser 1.18 0.40 - 1.50 mg/dL   Total Bilirubin 1.0 0.2 - 1.2 mg/dL   Alkaline Phosphatase 83 39 - 117 U/L   AST 22 0 - 37 U/L   ALT 21 0 - 53 U/L   Total Protein 7.6 6.0 - 8.3 g/dL   Albumin 4.3 3.5 - 5.2 g/dL   GFR 61.39 >60.00 mL/min   Calcium 9.4 8.4 - 10.5 mg/dL   Objective  Body mass index is 27.26 kg/m. Wt Readings from Last 3 Encounters:  05/08/19 184 lb 9.6 oz (83.7 kg)  12/11/18 176 lb (79.8 kg)  02/15/18 176 lb (79.8 kg)   Temp Readings from Last 3 Encounters:  05/08/19 (!) 97.4 F (36.3 C) (Temporal)  02/15/18 98.1 F (36.7 C) (Oral)  01/22/18 (!) 97 F (36.1 C) (Oral)   BP Readings from Last 3 Encounters:  05/08/19 138/78  08/16/18 139/80  02/15/18  126/72   Pulse Readings from Last 3 Encounters:  05/08/19 88  02/15/18 87  01/22/18 78    Physical Exam Vitals and nursing note reviewed.  Constitutional:      Appearance: Normal appearance. He is well-developed and well-groomed.  HENT:     Head: Normocephalic and atraumatic.  Eyes:     Conjunctiva/sclera: Conjunctivae normal.     Pupils: Pupils are equal, round, and reactive to light.  Cardiovascular:     Rate and Rhythm: Normal rate and regular rhythm.     Heart sounds: Normal heart sounds. No murmur.  Pulmonary:     Effort: Pulmonary effort is  normal.     Breath sounds: Normal breath sounds.  Abdominal:     General: Abdomen is flat. Bowel sounds are normal.     Tenderness: There is no abdominal tenderness.  Skin:    General: Skin is warm and dry.  Neurological:     General: No focal deficit present.     Mental Status: He is alert and oriented to person, place, and time. Mental status is at baseline.     Gait: Gait normal.  Psychiatric:        Attention and Perception: Attention and perception normal.        Mood and Affect: Mood and affect normal.        Speech: Speech normal.        Behavior: Behavior normal. Behavior is cooperative.        Thought Content: Thought content normal.        Cognition and Memory: Cognition and memory normal.        Judgment: Judgment normal.     Assessment  Plan  Acute pain of right knee - Plan: DG Knee Complete 4 Views Right, Urinalysis, Routine w reflex microscopic Consider ortho Dr. Harlow Mares   Essential hypertension - Plan: Comprehensive metabolic panel, Lipid panel, CBC w/Diff, Urinalysis, Routine w reflex microscopic Cont meds   Hyperlipidemia, unspecified hyperlipidemia type - Plan: Lipid panel Cont meds   Type 2 diabetes mellitus without complication, without long-term current use of insulin ([place]) - Plan: Lipid panel, Hemoglobin A1c  hM Flu shotutd Tdap utd  prevnar rec and will need pna 23 in 1 year covid vx 2/2   had 2/2 shingrix vaccines   DRE in futurePSA 10/2019  colonoscopy had 11/23/15 moderate to severe diverticulosis 2 polyps no pathology report noted Dr. Rexene Alberts [facility] requested recordsfor pathology report -rec repeat in 5 years with h/o polyps  Hep C neg   dermatologysawDr. Kellie Moor tbse multiple nevidue to see again 1/9/2020saw cyst removal back 03/07/2018  02/2019 nl tbse   Eye exam AE 05/29/19    [facility]

## 2019-05-09 ENCOUNTER — Encounter: Payer: Self-pay | Admitting: Internal Medicine

## 2019-05-09 NOTE — Telephone Encounter (Signed)
See result note.  

## 2019-05-12 NOTE — Addendum Note (Signed)
Addended by: Orland Mustard on: 05/12/2019 09:45 PM   Modules accepted: Orders

## 2019-05-21 DIAGNOSIS — M25562 Pain in left knee: Secondary | ICD-10-CM | POA: Diagnosis not present

## 2019-05-21 DIAGNOSIS — B351 Tinea unguium: Secondary | ICD-10-CM | POA: Diagnosis not present

## 2019-05-21 DIAGNOSIS — M25561 Pain in right knee: Secondary | ICD-10-CM | POA: Diagnosis not present

## 2019-05-21 DIAGNOSIS — E1140 Type 2 diabetes mellitus with diabetic neuropathy, unspecified: Secondary | ICD-10-CM | POA: Diagnosis not present

## 2019-08-01 LAB — HM DIABETES EYE EXAM

## 2019-08-07 ENCOUNTER — Encounter: Payer: Self-pay | Admitting: Internal Medicine

## 2019-08-14 LAB — HM DIABETES EYE EXAM

## 2019-08-16 ENCOUNTER — Encounter: Payer: Self-pay | Admitting: Internal Medicine

## 2019-08-19 NOTE — Telephone Encounter (Signed)
Please advise 

## 2019-08-19 NOTE — Addendum Note (Signed)
Addended by: Orland Mustard on: 08/19/2019 09:42 AM   Modules accepted: Orders

## 2019-08-20 DIAGNOSIS — B351 Tinea unguium: Secondary | ICD-10-CM | POA: Diagnosis not present

## 2019-08-20 DIAGNOSIS — E1140 Type 2 diabetes mellitus with diabetic neuropathy, unspecified: Secondary | ICD-10-CM | POA: Diagnosis not present

## 2019-08-22 ENCOUNTER — Other Ambulatory Visit: Payer: Self-pay

## 2019-08-22 ENCOUNTER — Emergency Department: Payer: No Typology Code available for payment source

## 2019-08-22 ENCOUNTER — Encounter: Payer: Self-pay | Admitting: Emergency Medicine

## 2019-08-22 ENCOUNTER — Ambulatory Visit: Payer: Medicare Other | Attending: Internal Medicine | Admitting: Audiologist

## 2019-08-22 ENCOUNTER — Emergency Department
Admission: EM | Admit: 2019-08-22 | Discharge: 2019-08-22 | Disposition: A | Discharge status: Home / Self Care | Payer: No Typology Code available for payment source | Attending: Emergency Medicine | Admitting: Emergency Medicine

## 2019-08-22 DIAGNOSIS — M542 Cervicalgia: Secondary | ICD-10-CM | POA: Diagnosis not present

## 2019-08-22 DIAGNOSIS — Z7982 Long term (current) use of aspirin: Secondary | ICD-10-CM | POA: Insufficient documentation

## 2019-08-22 DIAGNOSIS — Z7984 Long term (current) use of oral hypoglycemic drugs: Secondary | ICD-10-CM | POA: Insufficient documentation

## 2019-08-22 DIAGNOSIS — H903 Sensorineural hearing loss, bilateral: Secondary | ICD-10-CM | POA: Insufficient documentation

## 2019-08-22 DIAGNOSIS — Y999 Unspecified external cause status: Secondary | ICD-10-CM | POA: Diagnosis not present

## 2019-08-22 DIAGNOSIS — M545 Low back pain: Secondary | ICD-10-CM | POA: Diagnosis not present

## 2019-08-22 DIAGNOSIS — S0990XA Unspecified injury of head, initial encounter: Secondary | ICD-10-CM | POA: Diagnosis not present

## 2019-08-22 DIAGNOSIS — M549 Dorsalgia, unspecified: Secondary | ICD-10-CM | POA: Diagnosis not present

## 2019-08-22 DIAGNOSIS — S199XXA Unspecified injury of neck, initial encounter: Secondary | ICD-10-CM | POA: Diagnosis not present

## 2019-08-22 DIAGNOSIS — Z87891 Personal history of nicotine dependence: Secondary | ICD-10-CM | POA: Diagnosis not present

## 2019-08-22 DIAGNOSIS — I10 Essential (primary) hypertension: Secondary | ICD-10-CM | POA: Diagnosis not present

## 2019-08-22 DIAGNOSIS — R079 Chest pain, unspecified: Secondary | ICD-10-CM | POA: Diagnosis not present

## 2019-08-22 DIAGNOSIS — H9313 Tinnitus, bilateral: Secondary | ICD-10-CM | POA: Diagnosis not present

## 2019-08-22 DIAGNOSIS — R519 Headache, unspecified: Secondary | ICD-10-CM | POA: Insufficient documentation

## 2019-08-22 DIAGNOSIS — E119 Type 2 diabetes mellitus without complications: Secondary | ICD-10-CM | POA: Insufficient documentation

## 2019-08-22 DIAGNOSIS — Y9389 Activity, other specified: Secondary | ICD-10-CM | POA: Insufficient documentation

## 2019-08-22 DIAGNOSIS — M47816 Spondylosis without myelopathy or radiculopathy, lumbar region: Secondary | ICD-10-CM | POA: Diagnosis not present

## 2019-08-22 DIAGNOSIS — Z79899 Other long term (current) drug therapy: Secondary | ICD-10-CM | POA: Insufficient documentation

## 2019-08-22 DIAGNOSIS — I700 Atherosclerosis of aorta: Secondary | ICD-10-CM | POA: Diagnosis not present

## 2019-08-22 DIAGNOSIS — M4319 Spondylolisthesis, multiple sites in spine: Secondary | ICD-10-CM | POA: Diagnosis not present

## 2019-08-22 DIAGNOSIS — Y92410 Unspecified street and highway as the place of occurrence of the external cause: Secondary | ICD-10-CM | POA: Insufficient documentation

## 2019-08-22 DIAGNOSIS — S299XXA Unspecified injury of thorax, initial encounter: Secondary | ICD-10-CM | POA: Diagnosis not present

## 2019-08-22 MED ORDER — HYDROCODONE-ACETAMINOPHEN 5-325 MG PO TABS
1.0000 | ORAL_TABLET | Freq: Once | ORAL | Status: AC
  Administered 2019-08-22: 1 via ORAL
  Filled 2019-08-22: qty 1

## 2019-08-22 MED ORDER — TRAMADOL HCL 50 MG PO TABS: 50.0000 mg | ORAL_TABLET | Freq: Four times a day (QID) | ORAL | 0 refills | Status: AC | PRN

## 2019-08-22 MED ORDER — ONDANSETRON 4 MG PO TBDP
4.0000 mg | ORAL_TABLET | Freq: Once | ORAL | Status: AC
  Administered 2019-08-22: 4 mg via ORAL
  Filled 2019-08-22: qty 1
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
## 2019-08-22 NOTE — ED Notes (Signed)
Pt in scans, will administer meds when pt returns.
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
## 2019-08-22 NOTE — Procedures (Signed)
  Outpatient Audiology and [facility] [place],   [id number] [phone]  AUDIOLOGICAL  EVALUATION  NAME: Andrew Gonzalez     DOB:   1950-11-27      MRN: [id number]                                                                                     DATE: 08/22/2019     REFERENT: McLean-Scocuzza, Nino Glow, MD STATUS: Outpatient DIAGNOSIS: Sensorineural Hearing Loss, bilateral. Tinnitus, bilateral.     History: Andrew Gonzalez was seen for an audiological evaluation.  Andrew Gonzalez is receiving a hearing evaluation due to concerns for hearing loss and tinnitus in both ears. Andrew Gonzalez has difficulty hearing in background noise, crowds, and when people are at a distance. 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 He also has tinnitus in both ears that can make it difficult to hear on a phone. No pain or pressure reported in either ear. Andrew Gonzalez has a history of hazardous noise exposure from his time in the [employer]. No medical risk factors for hearing loss. No other relevant case history reported.   Evaluation:   Otoscopy showed a clear view of the tympanic membranes, bilaterally  Tympanometry results were consistent with normal function of the middle ear, bilaterally    Audiometric testing was completed using conventional audiometry with insert ER3A transducer. Speech Recognition Thresholds consistent with pure tone averages. Word Recognition was excellent at an elevated level. Pure tone thresholds show normal hearing sloping to a moderately severe sensorineural hearing loss in both ears. Slight asymmetry present at 1k and 2k Hz with the left ear worse.    Results:  The test results were reviewed with Andrew Gonzalez. 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 [name] is a borderline candidate for hearing aids. Hearing aids require consistent daily use to provide maximum benefit. If Andrew Gonzalez feels his daily life is negatively impacted by his hearing loss then should try hearing aids for both ears. Until then he needs to monitor the asymmetry in his hearing and return annually for hearing tests.   Recommendations: 1. Amplification is necessary for both ears if patient is ready for consistent daily use. Hearing aids can be purchased from a variety of locations. Patient was advised of from what health professionals to see for amplification or to pursue hearing aids through the [place]. He is service connected.  2. Annual audiometric evaluation is necessary to monitor asymmetric hearing loss. 3. If pain, pressure, or a change in tinnitus occurs in the left ear then Andrew Gonzalez was advised to see an ENT Physician.    Andrew Gonzalez  Audiologist, Au.D., CCC-A 08/22/2019  3:36 PM  Cc: McLean-Scocuzza, Nino Glow, MD

## 2019-10-07 IMAGING — CR DG HIP (WITH OR WITHOUT PELVIS) 2-3V*L*
3 series · 3 of 3 positions shown · non-contrast
Comparison: None.

CLINICAL DATA: Chronic bilateral hip pain without known injury.

EXAM:
DG HIP (WITH OR WITHOUT PELVIS) 2-3V LEFT

[pelvis ap]
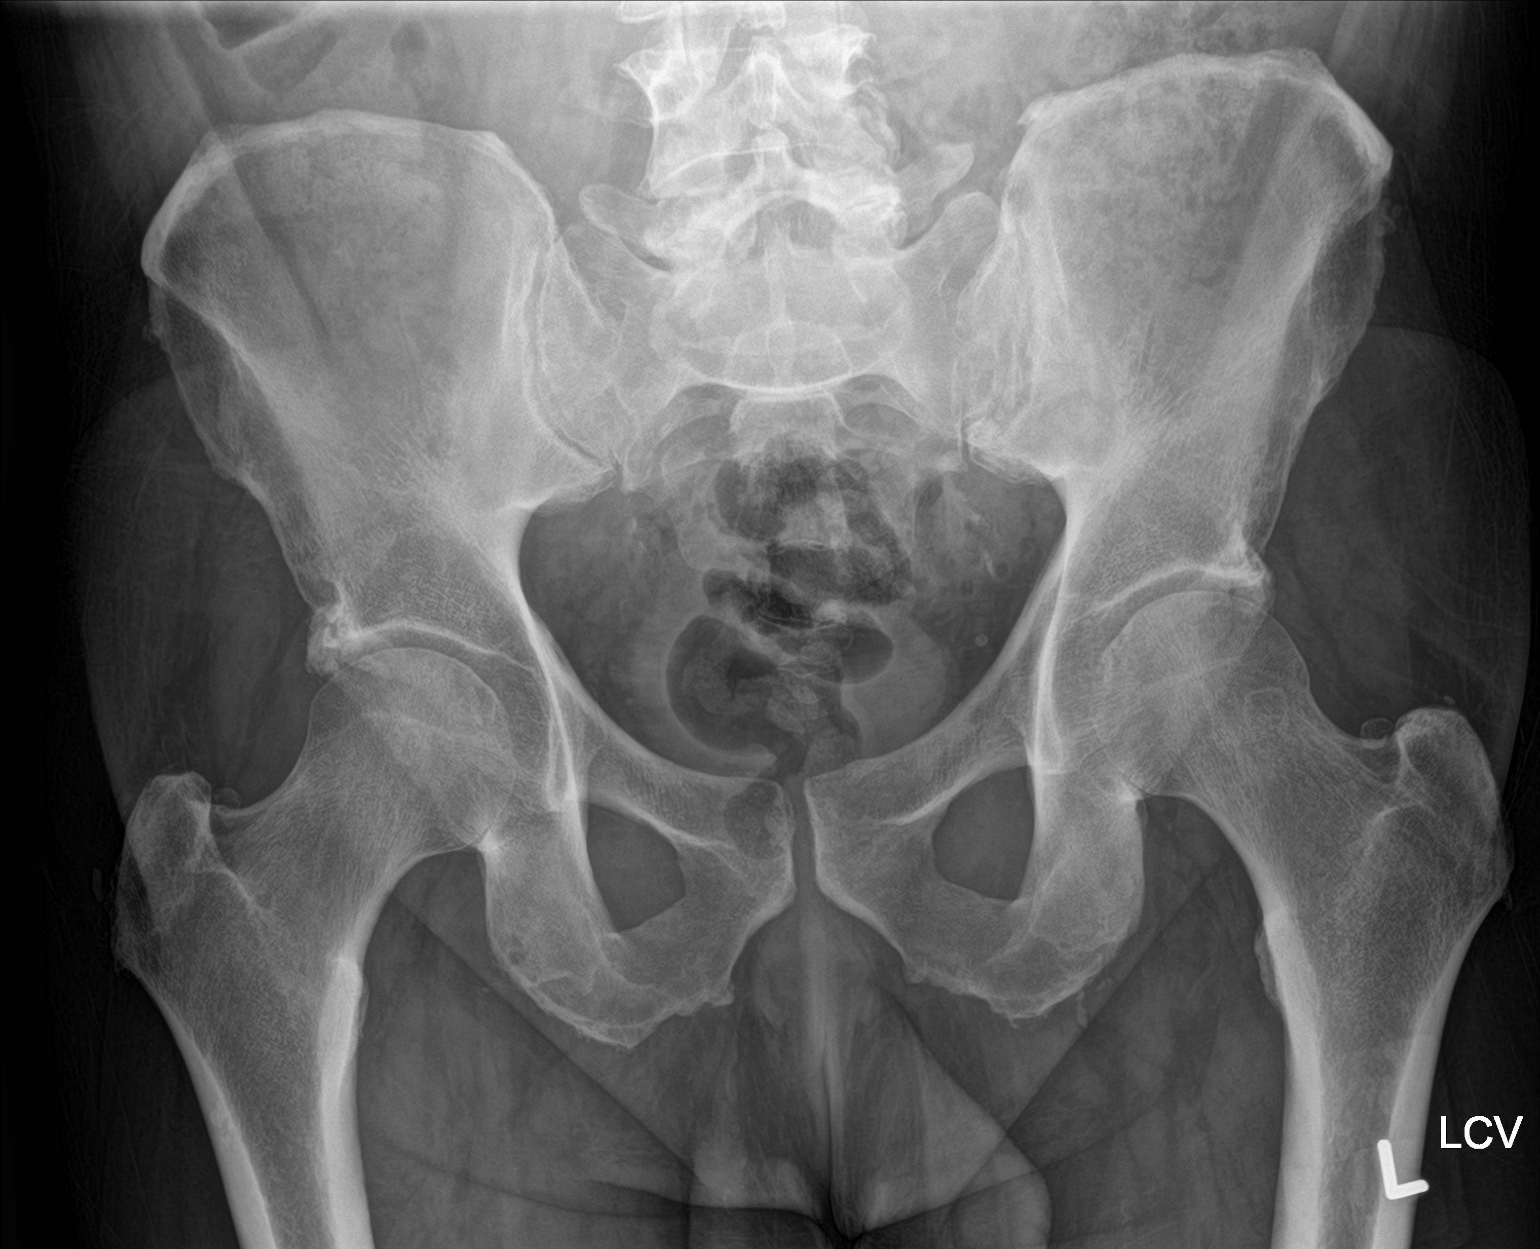

[hip ap]
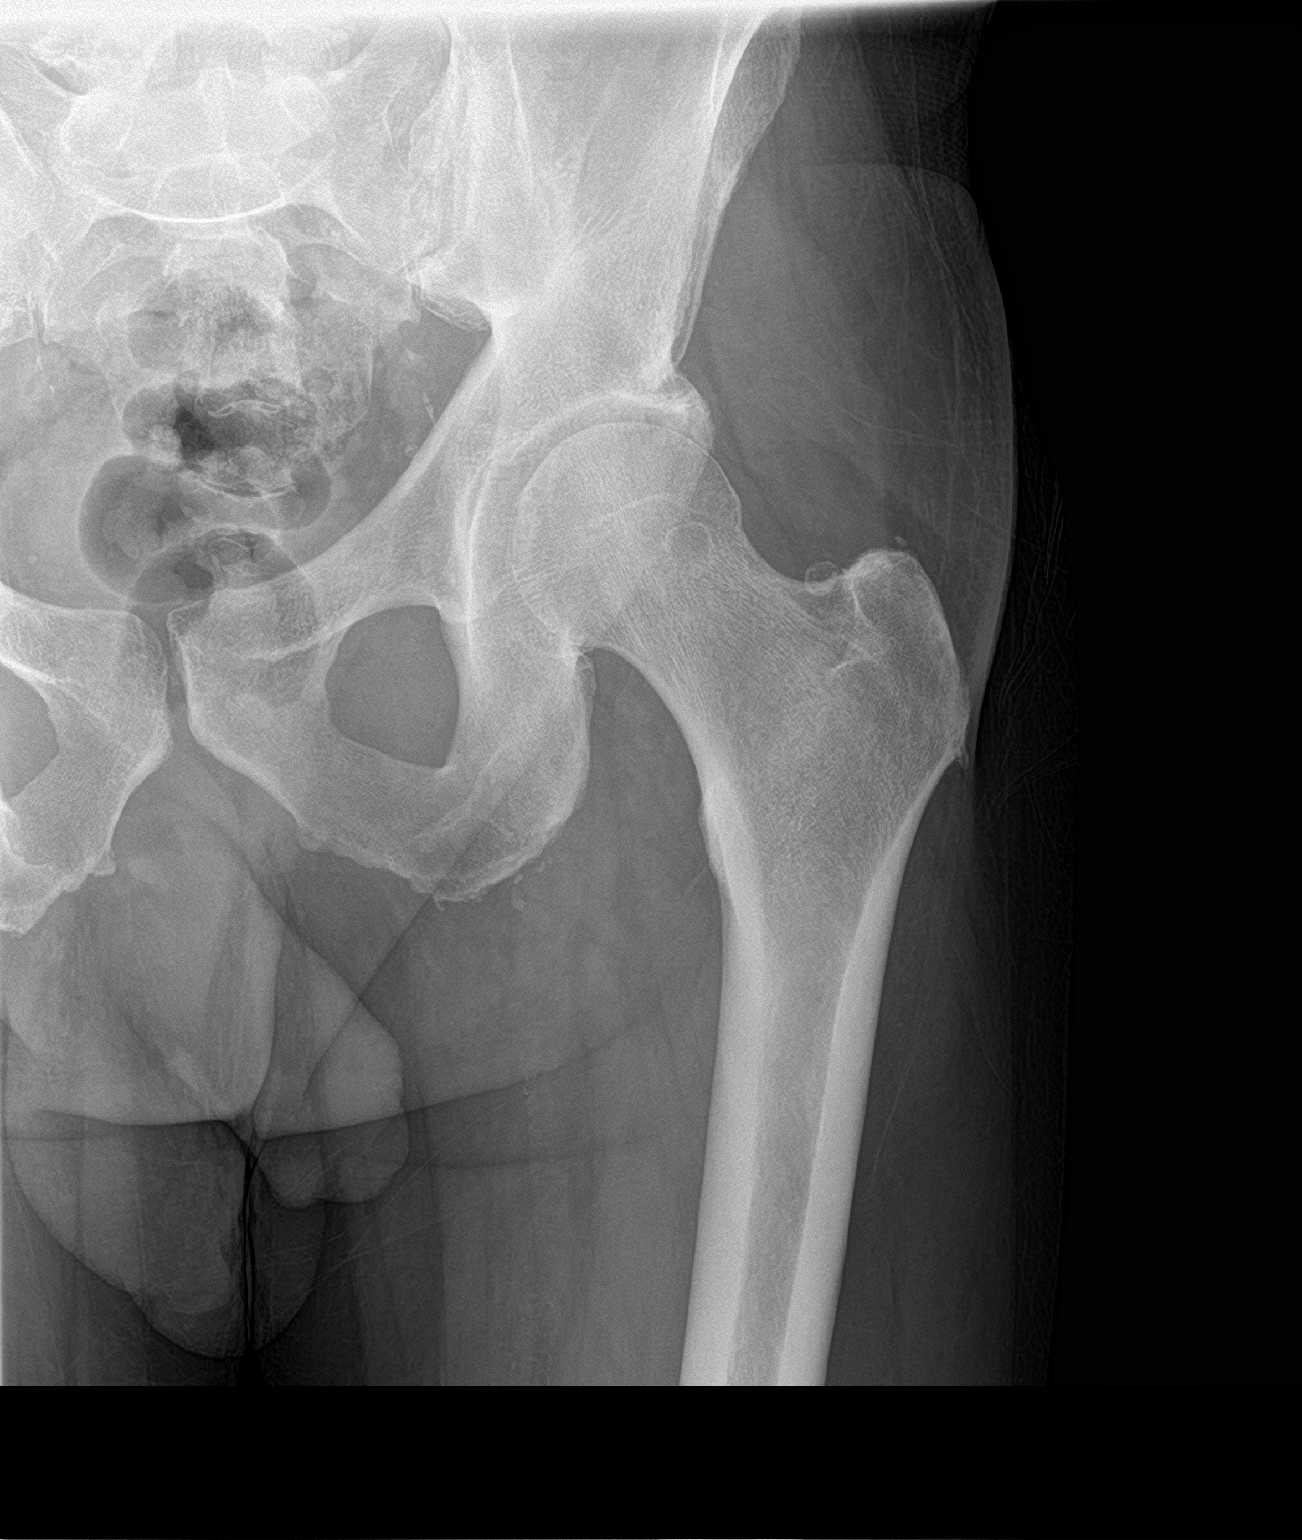

[hip lat]
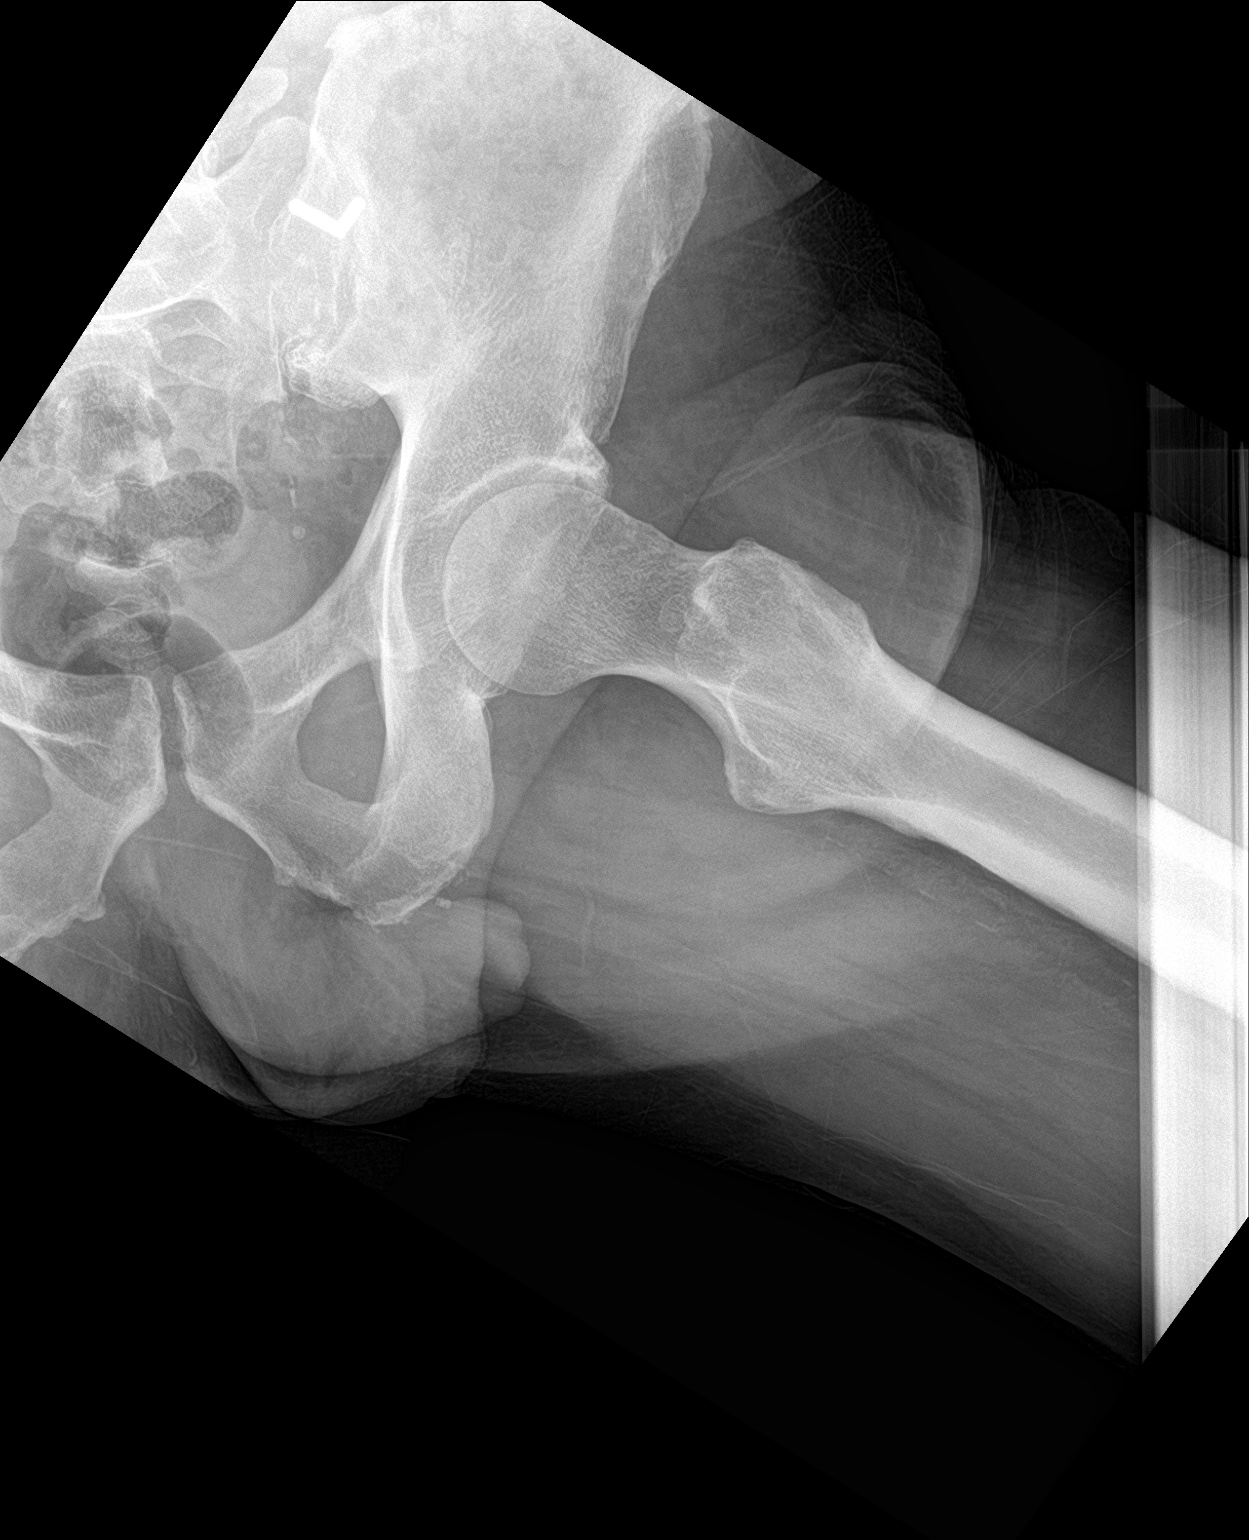

[3 of 3 positions shown; findings below may reference images not displayed]

FINDINGS: There is no evidence of hip fracture or dislocation. There is no
evidence of arthropathy or other focal bone abnormality.
IMPRESSION: Negative.

## 2019-10-07 IMAGING — CR DG HIP (WITH OR WITHOUT PELVIS) 2-3V*R*
3 series · 3 of 3 positions shown · non-contrast
Comparison: None.

CLINICAL DATA: Chronic bilateral hip pain without known injury.

EXAM:
DG HIP (WITH OR WITHOUT PELVIS) 2-3V RIGHT

[hip ap]
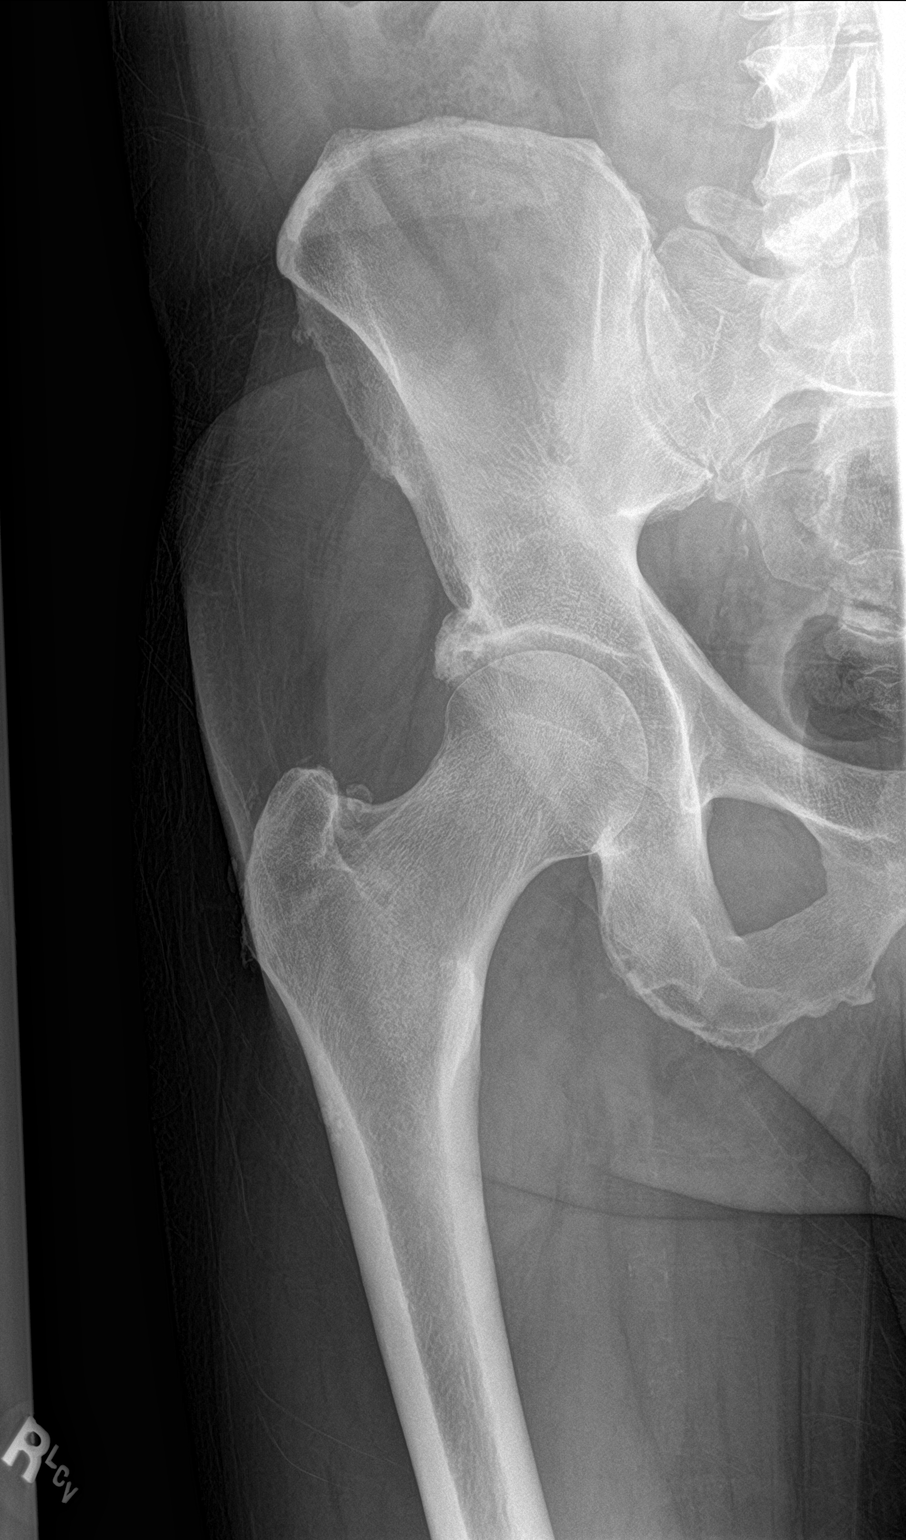

[pelvis ap]
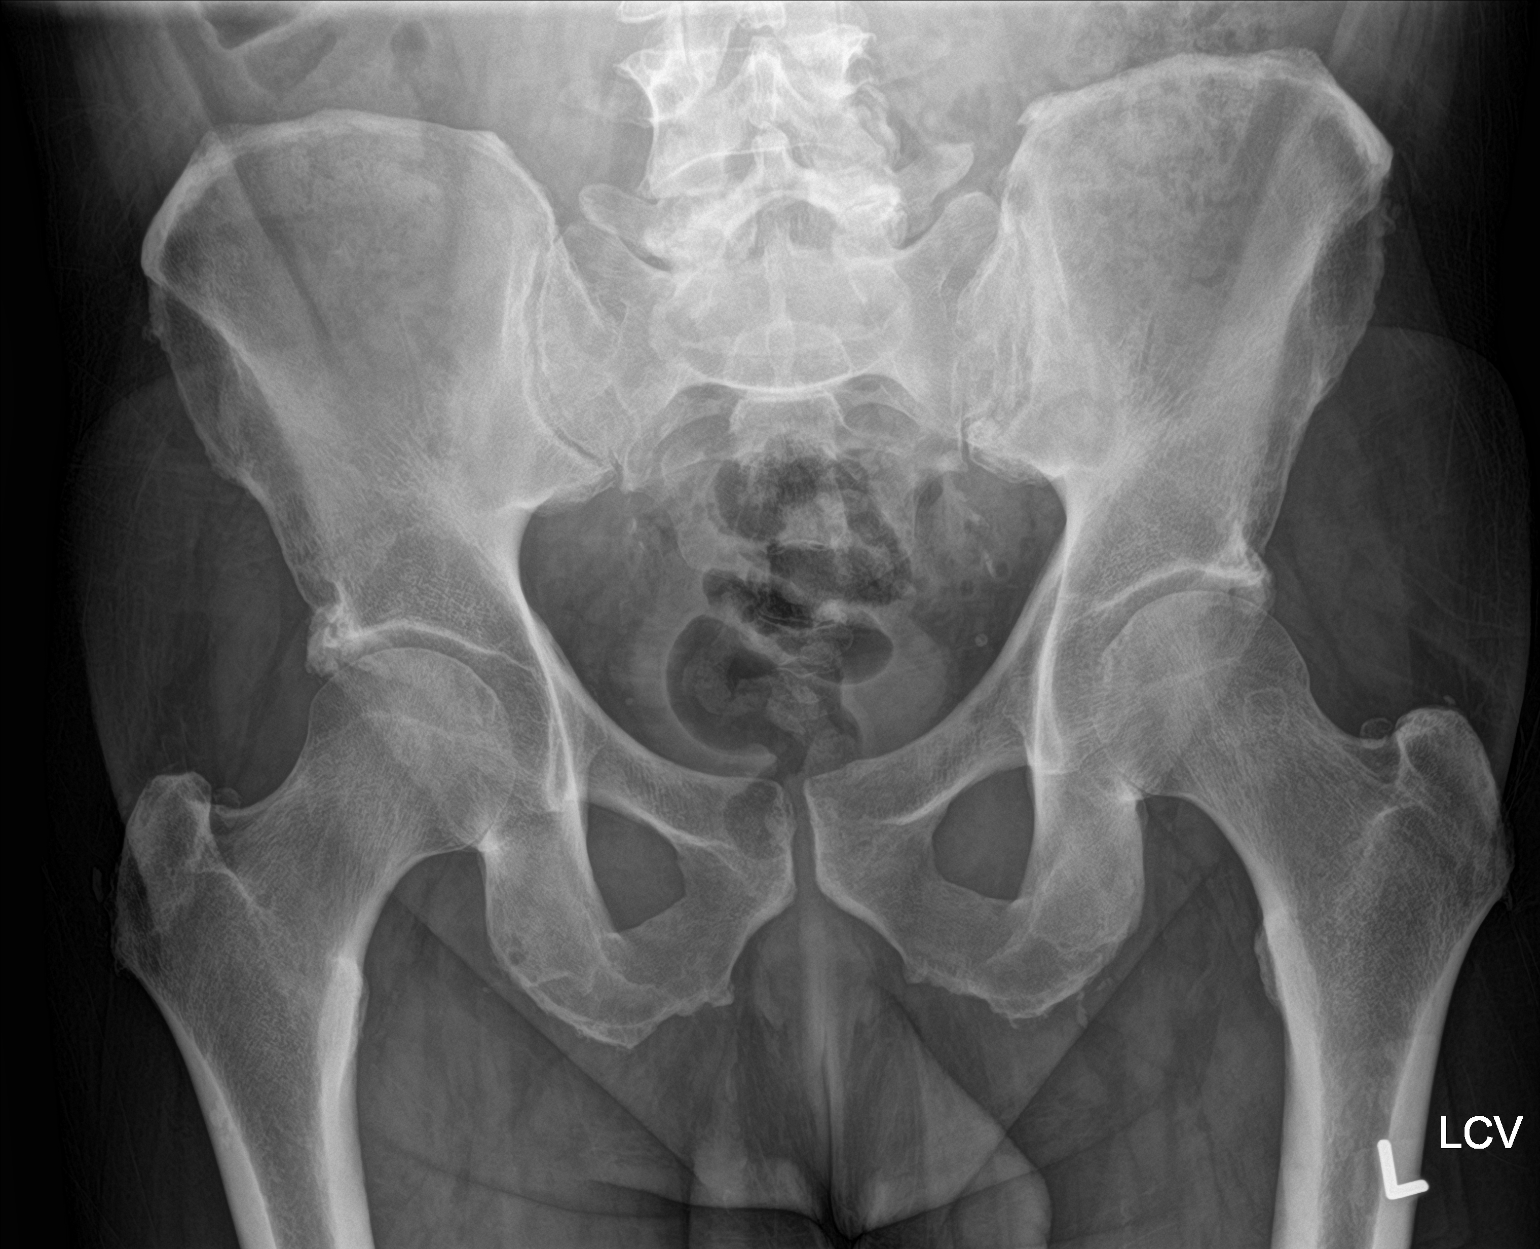

[hip lat]
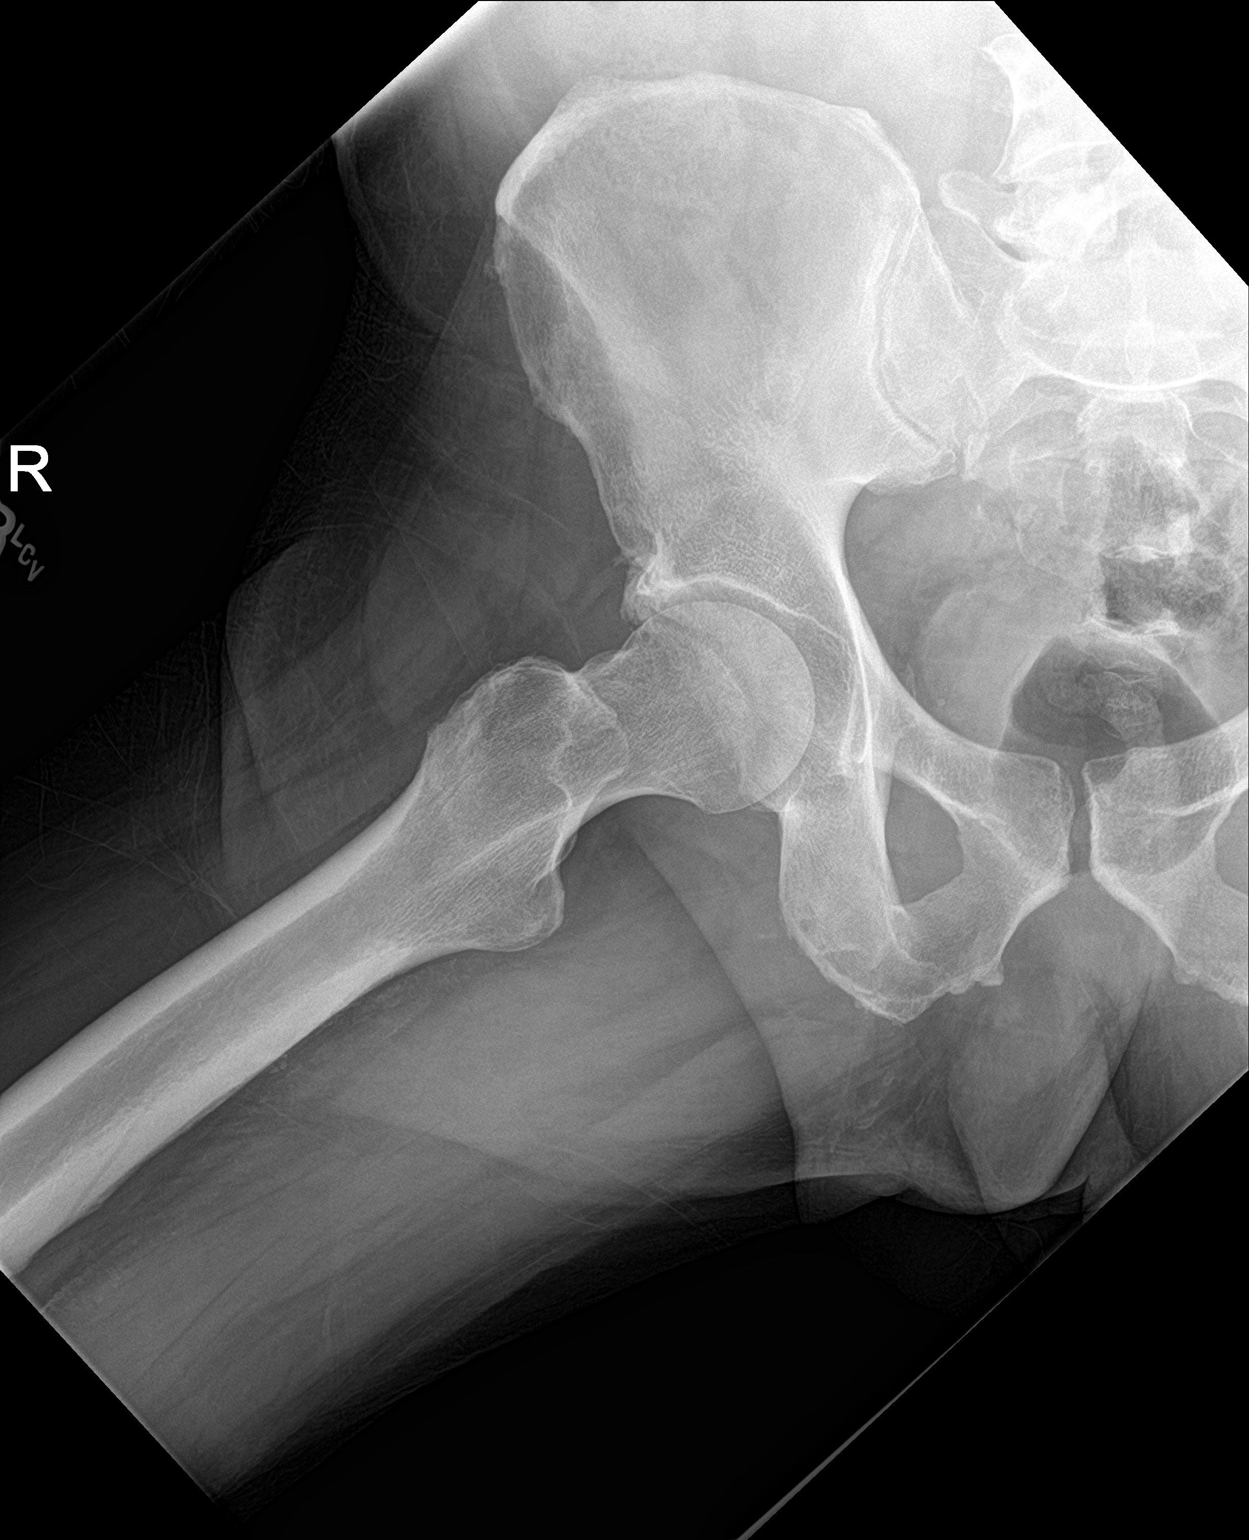

[3 of 3 positions shown; findings below may reference images not displayed]

FINDINGS: There is no evidence of hip fracture or dislocation. There is no
evidence of arthropathy or other focal bone abnormality.
IMPRESSION: Negative.

## 2019-10-08 ENCOUNTER — Encounter: Payer: Self-pay | Admitting: Internal Medicine

## 2019-10-08 DIAGNOSIS — R937 Abnormal findings on diagnostic imaging of other parts of musculoskeletal system: Secondary | ICD-10-CM

## 2019-10-08 DIAGNOSIS — M5416 Radiculopathy, lumbar region: Secondary | ICD-10-CM

## 2019-10-08 DIAGNOSIS — J309 Allergic rhinitis, unspecified: Secondary | ICD-10-CM

## 2019-10-08 DIAGNOSIS — E119 Type 2 diabetes mellitus without complications: Secondary | ICD-10-CM

## 2019-10-08 DIAGNOSIS — I10 Essential (primary) hypertension: Secondary | ICD-10-CM

## 2019-10-09 MED ORDER — FREESTYLE LANCETS MISC: 3 refills | Status: DC

## 2019-10-09 MED ORDER — FREESTYLE LITE TEST VI STRP: ORAL_STRIP | 12 refills | Status: DC

## 2019-10-09 MED ORDER — CETIRIZINE HCL 10 MG PO TABS: 10.0000 mg | ORAL_TABLET | Freq: Every day | ORAL | 3 refills | Status: DC

## 2019-10-09 MED ORDER — ATORVASTATIN CALCIUM 20 MG PO TABS: 20.0000 mg | ORAL_TABLET | Freq: Every day | ORAL | 3 refills | Status: DC

## 2019-10-09 MED ORDER — CYCLOBENZAPRINE HCL 5 MG PO TABS
5.0000 mg | ORAL_TABLET | Freq: Every evening | ORAL | 2 refills | Status: DC | PRN
Start: 2019-10-09 — End: 2020-06-16

## 2019-10-09 MED ORDER — AMLODIPINE BESYLATE 2.5 MG PO TABS: 2.5000 mg | ORAL_TABLET | Freq: Every day | ORAL | 3 refills | Status: DC

## 2019-10-09 MED ORDER — SITAGLIPTIN PHOSPHATE 100 MG PO TABS: 100.0000 mg | ORAL_TABLET | Freq: Every day | ORAL | 3 refills | Status: DC

## 2019-10-09 MED ORDER — METFORMIN HCL 1000 MG PO TABS: 1000.0000 mg | ORAL_TABLET | Freq: Two times a day (BID) | ORAL | 3 refills | Status: DC

## 2019-11-06 ENCOUNTER — Encounter: Payer: Self-pay | Admitting: Internal Medicine

## 2019-11-06 ENCOUNTER — Other Ambulatory Visit: Payer: Self-pay | Admitting: Internal Medicine

## 2019-11-06 ENCOUNTER — Telehealth: Payer: Self-pay | Admitting: Internal Medicine

## 2019-11-06 DIAGNOSIS — U071 COVID-19: Secondary | ICD-10-CM

## 2019-11-06 DIAGNOSIS — M791 Myalgia, unspecified site: Secondary | ICD-10-CM

## 2019-11-06 MED ORDER — PREDNISONE 20 MG PO TABS: 40.0000 mg | ORAL_TABLET | Freq: Every day | ORAL | 0 refills | Status: DC

## 2019-11-06 MED ORDER — IBUPROFEN 200 MG PO TABS: 200.0000 mg | ORAL_TABLET | Freq: Three times a day (TID) | ORAL | 0 refills | Status: DC | PRN

## 2019-11-06 NOTE — Telephone Encounter (Signed)
Pt tested positive for covid on 11/06/19. He has a virtual appt Friday with Dr Derrel Nip but would like a call back about medications to take. Pt states that he is taking Mucinex expectorant and drinking Gatorade. He states that he has a low grade fever, slight cough, and congestion. Pt denies headache or shortness of breath. Please advise.

## 2019-11-06 NOTE — Telephone Encounter (Signed)
Infusion line called and Patient information left. Please see previous Patient message encounter from yesterday 11/05/19

## 2019-11-06 NOTE — Telephone Encounter (Signed)
Please call infusion center for patient   Please advise on covid home care and otc meds  Order pulse ox to check O2 >90 if worsening sob, chest pain go to ED   Home isolation sleep in separate bedroom and wear mask in the house when not in bedroom  clorox/lysol all surfaces   If visit needed [facility] Friday I think

## 2019-11-07 ENCOUNTER — Telehealth: Payer: Self-pay | Admitting: Internal Medicine

## 2019-11-07 ENCOUNTER — Other Ambulatory Visit: Payer: Medicare Other

## 2019-11-07 NOTE — Telephone Encounter (Signed)
Patient is Covid, 69 years old, HTN,   diabetes. Qualifies for [facility] infusion. LMOM, asked for a call back [phone]

## 2019-11-08 ENCOUNTER — Telehealth (INDEPENDENT_AMBULATORY_CARE_PROVIDER_SITE_OTHER): Payer: Medicare Other | Admitting: Internal Medicine

## 2019-11-08 ENCOUNTER — Encounter: Payer: Self-pay | Admitting: Internal Medicine

## 2019-11-08 VITALS — BP 143/80 | HR 97 | Temp 99.0°F | Ht 69.0 in | Wt 174.0 lb

## 2019-11-08 DIAGNOSIS — U071 COVID-19: Secondary | ICD-10-CM | POA: Diagnosis not present

## 2019-11-08 MED ORDER — BENZONATATE 200 MG PO CAPS
200.0000 mg | ORAL_CAPSULE | Freq: Three times a day (TID) | ORAL | 1 refills | Status: DC | PRN
Start: 2019-11-08 — End: 2020-06-16

## 2019-11-08 NOTE — Progress Notes (Signed)
Virtual Visit via [place]  This visit type was conducted due to national recommendations for restrictions regarding the COVID-19 pandemic (e.g. social distancing).  This format is felt to be most appropriate for this patient at this time.  All issues noted in this document were discussed and addressed.  No physical exam was performed (except for noted visual exam findings with Video Visits).   I connected with@ on 11/08/19 at 11:30 AM EDT by a video enabled telemedicine application  and verified that I am speaking with the correct person using two identifiers. Location patient: home Location provider: work or home office Persons participating in the virtual visit: patient, provider  I discussed the limitations, risks, security and privacy concerns of performing an evaluation and management service by telephone and the availability of in person appointments. I also discussed with the patient that there may be a patient responsible charge related to this service. The patient expressed understanding and agreed to proceed.  Reason for visit: COVID 26 INFECTION  HPI:  69 yr old male with Type 2 DM on oral medications,  Presents with symptomatic COVID 19 INFECTION .  Symptoms began 8 days ago with rhinitis and sneezing initially presumed to be allergic rhinitis. He developed a cough on Day 5 and tested positive on Sept 29. Has been taking mucinex and prednisone since Sept 30.  02 sats are 98% at rest and 97% after ambulation . He feels fatigued but denies dyspnea and home BP is 143/80.  He has been offered the [facility] infusion but has deferred    ROS: See pertinent positives and negatives per HPI.  Past Medical History:  Diagnosis Date  . Allergy   . Chicken pox   . Colon polyps   . Diabetes mellitus without complication ([place])   . Hyperlipidemia   . Hypertension     Past Surgical History:  Procedure Laterality Date  . TONSILLECTOMY      Family History  Problem Relation Age of Onset  .  Cancer Mother        bone, liver, lung former smoker ? lung died 05-18-2008  . Diabetes Mother   . Hypertension Mother   . Atrial fibrillation Sister     SOCIAL HX:  reports that he has quit smoking. He has never used smokeless tobacco. He reports current alcohol use of about 1.0 standard drink of alcohol per week. He reports that he does not use drugs.   Current Outpatient Medications:  .  amLODipine (NORVASC) 2.5 MG tablet, Take 1 tablet (2.5 mg total) by mouth daily., Disp: 90 tablet, Rfl: 3 .  aspirin EC 81 MG tablet, Take 1 tablet (81 mg total) by mouth daily., Disp: 90 tablet, Rfl: 3 .  atorvastatin (LIPITOR) 20 MG tablet, Take 1 tablet (20 mg total) by mouth daily at 6 PM., Disp: 90 tablet, Rfl: 3 .  cetirizine (ZYRTEC) 10 MG tablet, Take 1 tablet (10 mg total) by mouth daily., Disp: 90 tablet, Rfl: 3 .  cholecalciferol (VITAMIN D3) 25 MCG (1000 UNIT) tablet, Take 1,000 Units by mouth daily., Disp: , Rfl:  .  Cinnamon 500 MG TABS, Take by mouth., Disp: , Rfl:  .  cyclobenzaprine (FLEXERIL) 5 MG tablet, Take 1-2 tablets (5-10 mg total) by mouth at bedtime as needed for muscle spasms., Disp: 60 tablet, Rfl: 2 .  glucose blood (FREESTYLE LITE) test strip, Use as instructed qd to bid E11.9, Disp: 180 each, Rfl: 12 .  ibuprofen (ADVIL) 200 MG tablet, Take 1-2 tablets (200-400  mg total) by mouth every 8 (eight) hours as needed., Disp: 60 tablet, Rfl: 0 .  Lancets (FREESTYLE) lancets, Qd to bid Use as instructed E11.9, Disp: 200 each, Rfl: 3 .  metFORMIN (GLUCOPHAGE) 1000 MG tablet, Take 1 tablet (1,000 mg total) by mouth 2 (two) times daily with a meal., Disp: 180 tablet, Rfl: 3 .  Multiple Vitamin (MULTIVITAMIN) tablet, Take 1 tablet by mouth daily., Disp: , Rfl:  .  predniSONE (DELTASONE) 20 MG tablet, Take 2 tablets (40 mg total) by mouth daily with breakfast. x5-7 days, Disp: 14 tablet, Rfl: 0 .  sitaGLIPtin (JANUVIA) 100 MG tablet, Take 1 tablet (100 mg total) by mouth daily., Disp: 90  tablet, Rfl: 3 .  benzonatate (TESSALON) 200 MG capsule, Take 1 capsule (200 mg total) by mouth 3 (three) times daily as needed for cough., Disp: 60 capsule, Rfl: 1  EXAM:  VITALS per patient if applicable:  GENERAL: alert, oriented, appears well and in no acute distress  HEENT: atraumatic, conjunttiva clear, no obvious abnormalities on inspection of external nose and ears  NECK: normal movements of the head and neck  LUNGS: on inspection no signs of respiratory distress, breathing rate appears normal, no obvious gross SOB, gasping or wheezing  CV: no obvious cyanosis  MS: moves all visible extremities without noticeable abnormality  PSYCH/NEURO: pleasant and cooperative, no obvious depression or anxiety, speech and thought processing grossly intact  ASSESSMENT AND PLAN:  Discussed the following assessment and plan:  COVID-19 virus infection - Plan: DG Chest 2 View  COVID-19 virus infection Symptoms are mild on Day 8.  No indication for infusion given his mild symptoms, despite his age and comorbidities . Continue prednisone, cough suppressants    I discussed the assessment and treatment plan with the patient. The patient was provided an opportunity to ask questions and all were answered. The patient agreed with the plan and demonstrated an understanding of the instructions.   The patient was advised to call back or seek an in-person evaluation if the symptoms worsen or if the condition fails to improve as anticipated.  I provided 20 minutes of face-to-face time during this encounter.   Crecencio Mc, MD

## 2019-11-10 DIAGNOSIS — U071 COVID-19: Secondary | ICD-10-CM | POA: Insufficient documentation

## 2019-11-10 NOTE — Assessment & Plan Note (Addendum)
Symptoms are mild on Day 8.  No indication for infusion given his mild symptoms, despite his age and comorbidities . Continue prednisone, cough suppressants

## 2019-11-14 ENCOUNTER — Ambulatory Visit: Payer: Medicare Other | Admitting: Internal Medicine

## 2019-12-02 DIAGNOSIS — E1140 Type 2 diabetes mellitus with diabetic neuropathy, unspecified: Secondary | ICD-10-CM | POA: Diagnosis not present

## 2019-12-02 DIAGNOSIS — B351 Tinea unguium: Secondary | ICD-10-CM | POA: Diagnosis not present

## 2019-12-11 ENCOUNTER — Other Ambulatory Visit: Payer: Self-pay

## 2019-12-11 ENCOUNTER — Other Ambulatory Visit (INDEPENDENT_AMBULATORY_CARE_PROVIDER_SITE_OTHER): Payer: Medicare Other

## 2019-12-11 DIAGNOSIS — E559 Vitamin D deficiency, unspecified: Secondary | ICD-10-CM

## 2019-12-11 DIAGNOSIS — Z125 Encounter for screening for malignant neoplasm of prostate: Secondary | ICD-10-CM

## 2019-12-11 DIAGNOSIS — E785 Hyperlipidemia, unspecified: Secondary | ICD-10-CM

## 2019-12-11 DIAGNOSIS — Z1329 Encounter for screening for other suspected endocrine disorder: Secondary | ICD-10-CM

## 2019-12-11 DIAGNOSIS — E119 Type 2 diabetes mellitus without complications: Secondary | ICD-10-CM

## 2019-12-11 DIAGNOSIS — M25561 Pain in right knee: Secondary | ICD-10-CM

## 2019-12-11 DIAGNOSIS — I10 Essential (primary) hypertension: Secondary | ICD-10-CM

## 2019-12-11 LAB — CBC WITH DIFFERENTIAL/PLATELET
Basophils Absolute: 0 10*3/uL (ref 0.0–0.1)
Basophils Relative: 0.6 % (ref 0.0–3.0)
Eosinophils Absolute: 0.1 10*3/uL (ref 0.0–0.7)
Eosinophils Relative: 1.6 % (ref 0.0–5.0)
HCT: 39.4 % (ref 39.0–52.0)
Hemoglobin: 13.2 g/dL (ref 13.0–17.0)
Lymphocytes Relative: 33.2 % (ref 12.0–46.0)
Lymphs Abs: 2 10*3/uL (ref 0.7–4.0)
MCHC: 33.5 g/dL (ref 30.0–36.0)
MCV: 90 fl (ref 78.0–100.0)
Monocytes Absolute: 0.6 10*3/uL (ref 0.1–1.0)
Monocytes Relative: 9.8 % (ref 3.0–12.0)
Neutro Abs: 3.3 10*3/uL (ref 1.4–7.7)
Neutrophils Relative %: 54.8 % (ref 43.0–77.0)
Platelets: 253 10*3/uL (ref 150.0–400.0)
RBC: 4.37 Mil/uL (ref 4.22–5.81)
RDW: 13.9 % (ref 11.5–15.5)
WBC: 6 10*3/uL (ref 4.0–10.5)

## 2019-12-11 LAB — LIPID PANEL
Cholesterol: 109 mg/dL (ref 0–200)
HDL: 39.1 mg/dL (ref >39.00)
LDL Cholesterol: 50 mg/dL (ref 0–99)
NonHDL: 70.11
Total CHOL/HDL Ratio: 3
Triglycerides: 99 mg/dL (ref 0.0–149.0)
VLDL: 19.8 mg/dL (ref 0.0–40.0)

## 2019-12-11 LAB — COMPREHENSIVE METABOLIC PANEL
ALT: 19 U/L (ref 0–53)
AST: 21 U/L (ref 0–37)
Albumin: 4.3 g/dL (ref 3.5–5.2)
Alkaline Phosphatase: 71 U/L (ref 39–117)
BUN: 17 mg/dL (ref 6–23)
CO2: 24 mEq/L (ref 19–32)
Calcium: 8.9 mg/dL (ref 8.4–10.5)
Chloride: 104 mEq/L (ref 96–112)
Creatinine, Ser: 1.08 mg/dL (ref 0.40–1.50)
GFR: 70.44 mL/min (ref >60.00)
Glucose, Bld: 121 mg/dL — ABNORMAL HIGH (ref 70–99)
Potassium: 4.5 mEq/L (ref 3.5–5.1)
Sodium: 137 mEq/L (ref 135–145)
Total Bilirubin: 0.8 mg/dL (ref 0.2–1.2)
Total Protein: 6.8 g/dL (ref 6.0–8.3)

## 2019-12-11 LAB — POCT GLYCOSYLATED HEMOGLOBIN (HGB A1C): Hemoglobin A1C: 6.8 % — AB (ref 4.0–5.6)

## 2019-12-11 LAB — TSH: TSH: 1.87 u[IU]/mL (ref 0.35–4.50)

## 2019-12-11 LAB — PSA, MEDICARE: PSA: 2.53 ng/ml (ref 0.10–4.00)

## 2019-12-11 LAB — VITAMIN D 25 HYDROXY (VIT D DEFICIENCY, FRACTURES): VITD: 51.4 ng/mL (ref 30.00–100.00)

## 2019-12-12 ENCOUNTER — Ambulatory Visit (INDEPENDENT_AMBULATORY_CARE_PROVIDER_SITE_OTHER): Payer: Medicare Other

## 2019-12-12 VITALS — BP 128/73 | Ht 69.0 in | Wt 175.0 lb

## 2019-12-12 DIAGNOSIS — Z0000 Encounter for general adult medical examination without abnormal findings: Secondary | ICD-10-CM

## 2019-12-12 LAB — URINALYSIS, ROUTINE W REFLEX MICROSCOPIC
Bilirubin Urine: NEGATIVE
Glucose, UA: NEGATIVE
Hgb urine dipstick: NEGATIVE
Ketones, ur: NEGATIVE
Leukocytes,Ua: NEGATIVE
Nitrite: NEGATIVE
Protein, ur: NEGATIVE
Specific Gravity, Urine: 1.015 (ref 1.001–1.03)
pH: 5.5 (ref 5.0–8.0)

## 2019-12-12 NOTE — Progress Notes (Addendum)
Subjective:   Andrew Gonzalez is a 69 y.o. male who presents for Medicare Annual/Subsequent preventive examination.  Review of Systems    No ROS.  Medicare Wellness Virtual Visit.     Cardiac Risk Factors include: advanced age (>31men, >78 women);male gender;hypertension;diabetes mellitus     Objective:    Today's Vitals   12/12/19 1034  BP: 128/73  Weight: 175 lb (79.4 kg)  Height: 5\' 9"  (1.753 m)   Body mass index is 25.84 kg/m.  Advanced Directives 12/12/2019 08/22/2019 12/11/2018 01/08/2018 12/07/2017 11/07/2017  Does Patient Have a Medical Advance Directive? Yes No Yes No No No  Type of Paramedic of [place];Living will - [place];Living will - - -  Does patient want to make changes to medical advance directive? No - Patient declined - No - Patient declined - - -  Copy of [place] in Chart? Yes - validated most recent copy scanned in chart (See row information) - No - copy requested - - -  Would patient like information on creating a medical advance directive? - - - No - Patient declined No - Patient declined Yes (MAU/Ambulatory/Procedural Areas - Information given)    Current Medications (verified) Outpatient Encounter Medications as of 12/12/2019  Medication Sig  . amLODipine (NORVASC) 2.5 MG tablet Take 1 tablet (2.5 mg total) by mouth daily.  Marland Kitchen aspirin EC 81 MG tablet Take 1 tablet (81 mg total) by mouth daily.  Marland Kitchen atorvastatin (LIPITOR) 20 MG tablet Take 1 tablet (20 mg total) by mouth daily at 6 PM.  . benzonatate (TESSALON) 200 MG capsule Take 1 capsule (200 mg total) by mouth 3 (three) times daily as needed for cough.  . cetirizine (ZYRTEC) 10 MG tablet Take 1 tablet (10 mg total) by mouth daily.  . cholecalciferol (VITAMIN D3) 25 MCG (1000 UNIT) tablet Take 1,000 Units by mouth daily.  . Cinnamon 500 MG TABS Take by mouth.  . cyclobenzaprine (FLEXERIL) 5 MG tablet Take 1-2 tablets (5-10 mg total) by mouth  at bedtime as needed for muscle spasms.  Marland Kitchen glucose blood (FREESTYLE LITE) test strip Use as instructed qd to bid E11.9  . ibuprofen (ADVIL) 200 MG tablet Take 1-2 tablets (200-400 mg total) by mouth every 8 (eight) hours as needed.  . Lancets (FREESTYLE) lancets Qd to bid Use as instructed E11.9  . metFORMIN (GLUCOPHAGE) 1000 MG tablet Take 1 tablet (1,000 mg total) by mouth 2 (two) times daily with a meal.  . Multiple Vitamin (MULTIVITAMIN) tablet Take 1 tablet by mouth daily.  . predniSONE (DELTASONE) 20 MG tablet Take 2 tablets (40 mg total) by mouth daily with breakfast. x5-7 days  . sitaGLIPtin (JANUVIA) 100 MG tablet Take 1 tablet (100 mg total) by mouth daily.   No facility-administered encounter medications on file as of 12/12/2019.    Allergies (verified) Bactrim [sulfamethoxazole-trimethoprim] and Biofreeze [menthol (topical analgesic)]   History: Past Medical History:  Diagnosis Date  . Allergy   . Chicken pox   . Colon polyps   . Diabetes mellitus without complication ([place])   . Hyperlipidemia   . Hypertension    Past Surgical History:  Procedure Laterality Date  . TONSILLECTOMY     Family History  Problem Relation Age of Onset  . Cancer Mother        bone, liver, lung former smoker ? lung died 2008-04-29  . Diabetes Mother   . Hypertension Mother   . Atrial fibrillation Sister  Social History   Socioeconomic History  . Marital status: Married    Spouse name: Not on file  . Number of children: Not on file  . Years of education: Not on file  . Highest education level: Not on file  Occupational History  . Not on file  Tobacco Use  . Smoking status: Former Research scientist (life sciences)  . Smokeless tobacco: Never Used  Substance and Sexual Activity  . Alcohol use: Yes    Alcohol/week: 1.0 standard drink    Types: 1 Standard drinks or equivalent per week    Comment: occasional, maybe once a week  . Drug use: No  . Sexual activity: Yes    Partners: Female  Other Topics Concern  .  Not on file  Social History Narrative   Wears selt belt    Safe in relationship    Recently moved from [facility] 2018/19 originally from [facility] dad and sister 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    Retired Occupational hygienist degree    Social Determinants of Health   Financial Resource Strain: Low Risk   . Difficulty of Paying Living Expenses: Not hard at all  Food Insecurity: No Food Insecurity  . Worried About Charity fundraiser in the Last Year: Never true  . Ran Out of Food in the Last Year: Never true  Transportation Needs: No Transportation Needs  . Lack of Transportation (Medical): No  . Lack of Transportation (Non-Medical): No  Physical Activity: Insufficiently Active  . Days of Exercise per Week: 3 days  . Minutes of Exercise per Session: 30 min  Stress: No Stress Concern Present  . Feeling of Stress : Not at all  Social Connections: Unknown  . Frequency of Communication with Friends and Family: Not on file  . Frequency of Social Gatherings with Friends and Family: Not on file  . Attends Religious Services: Not on file  . Active Member of Clubs or Organizations: Not on file  . Attends Archivist Meetings: Not on file  . Marital Status: Married    Tobacco Counseling Counseling given: Not Answered   Clinical Intake:  Pre-visit preparation completed: Yes        Diabetes: Yes (Followed by pcp)  How often do you need to have someone help you when you read instructions, pamphlets, or other written materials from your doctor or pharmacy?: 1 - Never  Interpreter Needed?: No      Activities of Daily Living In your present state of health, do you have any difficulty performing the following activities: 12/12/2019  Hearing? N  Vision? N  Difficulty concentrating or making decisions? N  Walking or climbing stairs? N  Dressing or bathing? N  Doing errands, shopping? N  Preparing Food and eating ? N  Using the Toilet? N  In the past six months, have you  accidently leaked urine? N  Do you have problems with loss of bowel control? N  Managing your Medications? N  Managing your Finances? N  Housekeeping or managing your Housekeeping? N  Some recent data might be hidden    Patient Care Team: McLean-Scocuzza, Nino Glow, MD as PCP - General (Internal Medicine)  Indicate any recent Medical Services you may have received from other than [facility] providers in the past year (date may be approximate).     Assessment:   This is a routine wellness examination for [name].  I connected with Fidel today by telephone and verified that I am speaking with the correct person using two  identifiers. Location patient: home Location provider: work Persons participating in the virtual visit: patient, Marine scientist.    I discussed the limitations, risks, security and privacy concerns of performing an evaluation and management service by telephone and the availability of in person appointments. The patient expressed understanding and verbally consented to this telephonic visit.    Interactive audio and video telecommunications were attempted between this provider and patient, however failed, due to patient having technical difficulties OR patient did not have access to video capability.  We continued and completed visit with audio only.  Some vital signs may be absent or patient reported.   Hearing/Vision screen  Hearing Screening   125Hz  250Hz  500Hz  1000Hz  2000Hz  3000Hz  4000Hz  6000Hz  8000Hz   Right ear:           Left ear:           Comments: Patient has some difficulty hearing conversational tones.He does not wear hearing aids.   Vision Screening Comments: Followed by [facility] Wears corrective lenses No retinopathy reported.  Visual acuity not assessed, virtual visit.  They have seen their ophthalmologist in the last 12 months.     Dietary issues and exercise activities discussed: Current Exercise Habits: Home exercise routine, Type of exercise:  walking, Time (Minutes): 20, Frequency (Times/Week): 7, Weekly Exercise (Minutes/Week): 140, Intensity: Mild  Low carb diet Good water intake  Goals      Patient Stated   .  Weight 165lb (pt-stated)      Depression Screen PHQ 2/9 Scores 12/12/2019 05/08/2019 12/11/2018 01/22/2018 12/07/2017 11/07/2017 10/16/2017  PHQ - 2 Score 0 0 0 0 0 0 0    Fall Risk Fall Risk  12/12/2019 05/08/2019 12/11/2018 01/22/2018 01/08/2018  Falls in the past year? 0 0 0 0 0  Number falls in past yr: 0 0 0 - -  Injury with Fall? - 0 - - -  Follow up Falls evaluation completed Falls evaluation completed Falls evaluation completed - -   Handrails in use when climbing stairs? Yes Home free of loose throw rugs in walkways, pet beds, electrical cords, etc? Yes  Adequate lighting in your home to reduce risk of falls? Yes   ASSISTIVE DEVICES UTILIZED TO PREVENT FALLS: Use of a cane, walker or w/c? No   TIMED UP AND GO: Was the test performed? No . Virtual visit.   Cognitive Function: Patient is alert and oriented x3.  Denies difficulty with memory loss, making decisions.  Enjoys reading for brain health.  MMSE/6CIT deferred. Normal by direct communication/observation.   MMSE - Mini Mental State Exam 12/07/2017  Orientation to time 5  Orientation to Place 5  Registration 3  Attention/ Calculation 5  Recall 3  Language- name 2 objects 2  Language- repeat 1  Language- follow 3 step command 3  Language- read & follow direction 1  Write a sentence 1  Copy design 1  Total score 30     6CIT Screen 12/11/2018  What Year? 0 points  What month? 0 points  What time? 0 points  Count back from 20 0 points  Months in reverse 0 points  Repeat phrase 0 points  Total Score 0    Immunizations Immunization History  Administered Date(s) Administered  . Fluad Quad(high Dose 65+) 10/18/2018  . Influenza, High Dose Seasonal PF 10/16/2017  . Influenza-Unspecified 12/22/2016  . PFIZER SARS-COV-2 Vaccination  03/27/2019, 04/17/2019  . Pneumococcal Conjugate-13 10/18/2017  . Tdap 05/04/2017  . Zoster 08/27/2017  . Zoster Recombinat (Shingrix) 08/27/2017,  11/30/2017    Influenza vaccine- plans to receive next office visit.   Pneumococcal vaccine status: Declined,  Education has been provided regarding the importance of this vaccine but patient still declined. Advised may receive this vaccine at local pharmacy or Health Dept. Aware to provide a copy of the vaccination record if obtained from local pharmacy or Health Dept. Verbalized acceptance and understanding.  Deferred.   Health Maintenance Health Maintenance  Topic Date Due  . PNA vac Low Risk Adult (2 of 2 - PPSV23) 10/19/2018  . URINE MICROALBUMIN  10/18/2019  . INFLUENZA VACCINE  12/18/2019 (Originally 09/08/2019)  . HEMOGLOBIN A1C  06/09/2020  . OPHTHALMOLOGY EXAM  07/31/2020  . FOOT EXAM  12/01/2020  . COLONOSCOPY  11/22/2025  . TETANUS/TDAP  05/05/2027  . COVID-19 Vaccine  Completed  . Hepatitis C Screening  Completed    Colorectal cancer screening: Completed 11/23/15. Repeat every 10 years  Lung Cancer Screening: (Low Dose CT Chest recommended if Age 58-80 years, 30 pack-year currently smoking OR have quit w/in 15years.) does not qualify.   Hepatitis C Screening: Completed 04/19/19  Vision Screening: Recommended annual ophthalmology exams for early detection of glaucoma and other disorders of the eye. Is the patient up to date with their annual eye exam?  Yes  Who is the provider or what is the name of the office in which the patient attends annual eye exams? [facility] Screening: Recommended annual dental exams for proper oral hygiene  Community Resource Referral / Chronic Care Management: CRR required this visit?  No   CCM required this visit?  No      Plan:   Keep all routine maintenance appointments.   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 He is currently in contact with [facility] for therapy. However before treatment is received primary care physician must send referral. [facility] [employer] [phone], fax [phone]. Requests referral for PSTD sent to this local office. Patient is okay to wait for discussion with pcp if needed on 12/18/19.    I have personally reviewed and noted the following in the patient's chart:   . Medical and social history . Use of alcohol, tobacco or illicit drugs  . Current medications and supplements . Functional ability and status . Nutritional status . Physical activity . Advanced directives . List of other physicians . Hospitalizations, surgeries, and ER visits in previous 12 months . Vitals . Screenings to include cognitive, depression, and falls . Referrals and appointments  In addition, I have reviewed and discussed with patient certain preventive protocols, quality metrics, and best practice recommendations. A written personalized care plan for preventive services as well as general preventive health recommendations were provided to patient via mychart.     Varney Biles, LPN   [id number]   Agree Dr Olivia Mackie McLean-Scocuzza

## 2019-12-12 NOTE — Patient Instructions (Addendum)
[name] , Thank you for taking time to come for your Medicare Wellness Visit. I appreciate your ongoing commitment to your health goals. Please review the following plan we discussed and let me know if I can assist you in the future.   These are the goals we discussed: Goals      Patient Stated   .  Weight 165lb (pt-stated)       This is a list of the screening recommended for you and due dates:  Health Maintenance  Topic Date Due  . Pneumonia vaccines (2 of 2 - PPSV23) 10/19/2018  . Urine Protein Check  10/18/2019  . Flu Shot  12/18/2019*  . Hemoglobin A1C  06/09/2020  . Eye exam for diabetics  07/31/2020  . Complete foot exam   12/01/2020  . Colon Cancer Screening  11/22/2025  . Tetanus Vaccine  05/05/2027  . COVID-19 Vaccine  Completed  .  Hepatitis C: One time screening is recommended by Center for Disease Control  (CDC) for  adults born from [birth year] through 1964.   Completed  *Topic was postponed. The date shown is not the original due date.    Immunizations Immunization History  Administered Date(s) Administered  . Fluad Quad(high Dose 65+) 10/18/2018  . Influenza, High Dose Seasonal PF 10/16/2017  . Influenza-Unspecified 12/22/2016  . PFIZER SARS-COV-2 Vaccination 03/27/2019, 04/17/2019  . Pneumococcal Conjugate-13 10/18/2017  . Tdap 05/04/2017  . Zoster 08/27/2017  . Zoster Recombinat (Shingrix) 08/27/2017, 11/30/2017   Keep all routine maintenance appointments.   Follow up 12/18/19 @ 2:00  Advanced directives: completed, on file.   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 Note sent to pcp.   Follow up in one year for your annual wellness visit.  Preventive Care 35 Years and Older, Male Preventive care refers to lifestyle choices and visits with your health care provider that can promote health and wellness. What does preventive care include?  A yearly physical exam. This is also called an annual well check.  Dental exams once or twice a  year.  Routine eye exams. Ask your health care provider how often you should have your eyes checked.  Personal lifestyle choices, including:  Daily care of your teeth and gums.  Regular physical activity.  Eating a healthy diet.  Avoiding tobacco and drug use.  Limiting alcohol use.  Practicing safe sex.  Taking low doses of aspirin every day.  Taking vitamin and mineral supplements as recommended by your health care provider. What happens during an annual well check? The services and screenings done by your health care provider during your annual well check will depend on your age, overall health, lifestyle risk factors, and family history of disease. Counseling  Your health care provider may ask you questions about your:  Alcohol use.  Tobacco use.  Drug use.  Emotional well-being.  Home and relationship well-being.  Sexual activity.  Eating habits.  History of falls.  Memory and ability to understand (cognition).  Work and work Statistician. Screening  You may have the following tests or measurements:  Height, weight, and BMI.  Blood pressure.  Lipid and cholesterol levels. These may be checked every 5 years, or more frequently if you are over 67 years old.  Skin check.  Lung cancer screening. You may have this screening every year starting at age 12 if you have a 30-pack-year history of smoking and currently smoke or have quit within the past 15 years.  Fecal occult blood test (FOBT) of the stool. You may  have this test every year starting at age 66.  Flexible sigmoidoscopy or colonoscopy. You may have a sigmoidoscopy every 5 years or a colonoscopy every 10 years starting at age 25.  Prostate cancer screening. Recommendations will vary depending on your family history and other risks.  Hepatitis C blood test.  Hepatitis B blood test.  Sexually transmitted disease (STD) testing.  Diabetes screening. This is done by checking your blood sugar  (glucose) after you have not eaten for a while (fasting). You may have this done every 1-3 years.  Abdominal aortic aneurysm (AAA) screening. You may need this if you are a current or former smoker.  Osteoporosis. You may be screened starting at age 97 if you are at high risk. Talk with your health care provider about your test results, treatment options, and if necessary, the need for more tests. Vaccines  Your health care provider may recommend certain vaccines, such as:  Influenza vaccine. This is recommended every year.  Tetanus, diphtheria, and acellular pertussis (Tdap, Td) vaccine. You may need a Td booster every 10 years.  Zoster vaccine. You may need this after age 41.  Pneumococcal 13-valent conjugate (PCV13) vaccine. One dose is recommended after age 10.  Pneumococcal polysaccharide (PPSV23) vaccine. One dose is recommended after age 49. Talk to your health care provider about which screenings and vaccines you need and how often you need them. This information is not intended to replace advice given to you by your health care provider. Make sure you discuss any questions you have with your health care provider. Document Released: 02/20/2015 Document Revised: 10/14/2015 Document Reviewed: 11/25/2014 Elsevier Interactive Patient Education  2017 [place] Prevention in the Home Falls can cause injuries. They can happen to people of all ages. There are many things you can do to make your home safe and to help prevent falls. What can I do on the outside of my home?  Regularly fix the edges of walkways and driveways and fix any cracks.  Remove anything that might make you trip as you walk through a door, such as a raised step or threshold.  Trim any bushes or trees on the path to your home.  Use bright outdoor lighting.  Clear any walking paths of anything that might make someone trip, such as rocks or tools.  Regularly check to see if handrails are loose or  broken. Make sure that both sides of any steps have handrails.  Any raised decks and porches should have guardrails on the edges.  Have any leaves, snow, or ice cleared regularly.  Use sand or salt on walking paths during winter.  Clean up any spills in your garage right away. This includes oil or grease spills. What can I do in the bathroom?  Use night lights.  Install grab bars by the toilet and in the tub and shower. Do not use towel bars as grab bars.  Use non-skid mats or decals in the tub or shower.  If you need to sit down in the shower, use a plastic, non-slip stool.  Keep the floor dry. Clean up any water that spills on the floor as soon as it happens.  Remove soap buildup in the tub or shower regularly.  Attach bath mats securely with double-sided non-slip rug tape.  Do not have throw rugs and other things on the floor that can make you trip. What can I do in the bedroom?  Use night lights.  Make sure that you have a light  by your bed that is easy to reach.  Do not use any sheets or blankets that are too big for your bed. They should not hang down onto the floor.  Have a firm chair that has side arms. You can use this for support while you get dressed.  Do not have throw rugs and other things on the floor that can make you trip. What can I do in the kitchen?  Clean up any spills right away.  Avoid walking on wet floors.  Keep items that you use a lot in easy-to-reach places.  If you need to reach something above you, use a strong step stool that has a grab bar.  Keep electrical cords out of the way.  Do not use floor polish or wax that makes floors slippery. If you must use wax, use non-skid floor wax.  Do not have throw rugs and other things on the floor that can make you trip. What can I do with my stairs?  Do not leave any items on the stairs.  Make sure that there are handrails on both sides of the stairs and use them. Fix handrails that are  broken or loose. Make sure that handrails are as long as the stairways.  Check any carpeting to make sure that it is firmly attached to the stairs. Fix any carpet that is loose or worn.  Avoid having throw rugs at the top or bottom of the stairs. If you do have throw rugs, attach them to the floor with carpet tape.  Make sure that you have a light switch at the top of the stairs and the bottom of the stairs. If you do not have them, ask someone to add them for you. What else can I do to help prevent falls?  Wear shoes that:  Do not have high heels.  Have rubber bottoms.  Are comfortable and fit you well.  Are closed at the toe. Do not wear sandals.  If you use a stepladder:  Make sure that it is fully opened. Do not climb a closed stepladder.  Make sure that both sides of the stepladder are locked into place.  Ask someone to hold it for you, if possible.  Clearly Cristian and make sure that you can see:  Any grab bars or handrails.  First and last steps.  Where the edge of each step is.  Use tools that help you move around (mobility aids) if they are needed. These include:  Canes.  Walkers.  Scooters.  Crutches.  Turn on the lights when you go into a dark area. Replace any light bulbs as soon as they burn out.  Set up your furniture so you have a clear path. Avoid moving your furniture around.  If any of your floors are uneven, fix them.  If there are any pets around you, be aware of where they are.  Review your medicines with your doctor. Some medicines can make you feel dizzy. This can increase your chance of falling. Ask your doctor what other things that you can do to help prevent falls. This information is not intended to replace advice given to you by your health care provider. Make sure you discuss any questions you have with your health care provider. Document Released: 11/20/2008 Document Revised: 07/02/2015 Document Reviewed: 02/28/2014 Elsevier  Interactive Patient Education  2017 [employer].

## 2019-12-18 ENCOUNTER — Ambulatory Visit (INDEPENDENT_AMBULATORY_CARE_PROVIDER_SITE_OTHER): Payer: Medicare Other | Admitting: Internal Medicine

## 2019-12-18 ENCOUNTER — Encounter: Payer: Self-pay | Admitting: Internal Medicine

## 2019-12-18 ENCOUNTER — Other Ambulatory Visit: Payer: Self-pay

## 2019-12-18 VITALS — BP 126/82 | HR 95 | Temp 97.4°F | Ht 69.0 in | Wt 179.8 lb

## 2019-12-18 DIAGNOSIS — F419 Anxiety disorder, unspecified: Secondary | ICD-10-CM | POA: Diagnosis not present

## 2019-12-18 DIAGNOSIS — Z23 Encounter for immunization: Secondary | ICD-10-CM | POA: Diagnosis not present

## 2019-12-18 DIAGNOSIS — F4312 Post-traumatic stress disorder, chronic: Secondary | ICD-10-CM | POA: Insufficient documentation

## 2019-12-18 DIAGNOSIS — L723 Sebaceous cyst: Secondary | ICD-10-CM

## 2019-12-18 DIAGNOSIS — I152 Hypertension secondary to endocrine disorders: Secondary | ICD-10-CM | POA: Diagnosis not present

## 2019-12-18 DIAGNOSIS — F515 Nightmare disorder: Secondary | ICD-10-CM | POA: Diagnosis not present

## 2019-12-18 DIAGNOSIS — E1159 Type 2 diabetes mellitus with other circulatory complications: Secondary | ICD-10-CM | POA: Diagnosis not present

## 2019-12-18 MED ORDER — DOXYCYCLINE HYCLATE 100 MG PO TABS: 100.0000 mg | ORAL_TABLET | Freq: Two times a day (BID) | ORAL | 0 refills | Status: DC

## 2019-12-18 NOTE — Patient Instructions (Addendum)
[facility]  If covid booster consider pfizer 02/05/20 or after  Pneumonia 23 vaccine schedule here 03/2020    Pneumococcal Polysaccharide Vaccine (PPSV23): What You Need to Know 1. Why get vaccinated? Pneumococcal polysaccharide vaccine (PPSV23) can prevent pneumococcal disease. Pneumococcal disease refers to any illness caused by pneumococcal bacteria. These bacteria can cause many types of illnesses, including pneumonia, which is an infection of the lungs. Pneumococcal bacteria are one of the most common causes of pneumonia. Besides pneumonia, pneumococcal bacteria can also cause:  Ear infections  Sinus infections  Meningitis (infection of the tissue covering the brain and spinal cord)  Bacteremia (bloodstream infection) Anyone can get pneumococcal disease, but children under 72 years of age, people with certain medical conditions, adults 34 years or older, and cigarette smokers are at the highest risk. Most pneumococcal infections are mild. However, some can result in long-term problems, such as brain damage or hearing loss. Meningitis, bacteremia, and pneumonia caused by pneumococcal disease can be fatal. 2. PPSV23 PPSV23 protects against 23 types of bacteria that cause pneumococcal disease. PPSV23 is recommended for:  All adults 102 years or older,  Anyone 2 years or older with certain medical conditions that can lead to an increased risk for pneumococcal disease. Most people need only one dose of PPSV23. A second dose of PPSV23, and another type of pneumococcal vaccine called PCV13, are recommended for certain high-risk groups. Your health care provider can give you more information. People 65 years or older should get a dose of PPSV23 even if they have already gotten one or more doses of the vaccine before they turned 24. 3. Talk with your health care provider Tell your vaccine provider if the person getting the vaccine:  Has had an allergic reaction after a  previous dose of PPSV23, or has any severe, life-threatening allergies. In some cases, your health care provider may decide to postpone PPSV23 vaccination to a future visit. People with minor illnesses, such as a cold, may be vaccinated. People who are moderately or severely ill should usually wait until they recover before getting PPSV23. Your health care provider can give you more information. 4. Risks of a vaccine reaction  Redness or pain where the shot is given, feeling tired, fever, or muscle aches can happen after PPSV23. People sometimes faint after medical procedures, including vaccination. Tell your provider if you feel dizzy or have vision changes or ringing in the ears. As with any medicine, there is a very remote chance of a vaccine causing a severe allergic reaction, other serious injury, or death. 5. What if there is a serious problem? An allergic reaction could occur after the vaccinated person leaves the clinic. If you see signs of a severe allergic reaction (hives, swelling of the face and throat, difficulty breathing, a fast heartbeat, dizziness, or weakness), call 9-1-1 and get the person to the nearest hospital. For other signs that concern you, call your health care provider. Adverse reactions should be reported to the Vaccine Adverse Event Reporting System (VAERS). Your health care provider will usually file this report, or you can do it yourself. Visit the VAERS website at www.vaers.SamedayNews.es or call (573)370-9992. VAERS is only for reporting reactions, and VAERS staff do not give medical advice. 6. How can I learn more?  Ask your health care provider.  Call your local or state health department.  Contact the Centers for Disease Control and Prevention (CDC): ? Call (910)548-6114 (1-800-CDC-INFO) or ? Visit CDC's website at http://hunter.com/ CDC Vaccine Information  Statement PPSV23 Vaccine (12/06/2017) This information is not intended to replace advice given to you by  your health care provider. Make sure you discuss any questions you have with your health care provider. Document Revised: 05/15/2018 Document Reviewed: 09/05/2017 Elsevier Patient Education  2020 [place] Stress Disorder, Adult Post-traumatic stress disorder (PTSD) is a mental health disorder that can occur after a traumatic event, such as a threat to life, serious injury, or sexual violence. Sometimes, PTSD can occur in people who hear about trauma that occurs to a close family member or friend. PTSD can happen to anyone at any age. What are the causes? The condition may be caused by experiencing a traumatic event. What increases the risk? This condition is more likely to occur in:  Engineer, manufacturing.  People who are in circumstances where their lives are threatened.  People who have been the victim of, or witness to, a traumatic event, such as: ? Domestic violence. ? Physical or sexual abuse. ? Rape. ? A terrorist act or gun violence. ? Natural disasters. ? Accidents involving serious injury. What are the signs or symptoms? PTSD symptoms may start soon after a frightening event or months or years later. Symptoms last at least one month and tend to disrupt relationships, work, and daily activities. Symptoms of PTSD can be grouped into several categories. Intrusive symptoms This is when you re-experience the physical and emotional sensations of the traumatic event through one or more of the following ways:  Having upsetting dreams.  Feeling fear, horror, intense sadness, or anger in response to a reminder of the trauma.  Having unwanted upsetting memories while awake.  Having physical reactions triggered by reminders of the trauma, such as increased heart rate, shortness of breath, sweating, and shaking.  Having flashbacks, or feeling like you are going through the event again. Avoidance symptoms This is when you avoid anything that  reminds you of the trauma. Symptoms may also include:  Losing interest or not participating in daily activities.  Feeling disconnected from or avoiding other people.  Isolating yourself. Increased arousal symptoms You may have physical or emotional reactions triggered by your environment. Symptoms may include:  Being easily startled.  Behaving in a careless or self-destructive way.  Becoming easily irritated.  Feeling worried and nervous.  Having trouble concentrating.  Yelling at or hitting other people or objects.  Having trouble sleeping. Negative mood and thoughts  Believing that you or others are bad.  Feeling fear, horror, anger, sadness, guilt, or shame regularly.  Not being able to remember certain parts of the traumatic event.  Blaming yourself or others for the trauma.  Being unable to experience positive emotions, such as happiness or love. How is this diagnosed? PTSD is diagnosed through an assessment by a mental health professional. Dennis Bast will be asked questions about your symptoms. How is this treated? Treatment for this condition may include any of the following or a combination:  Taking medicines to reduce PTSD symptoms.  Having counseling with a mental health professional or therapist who is experienced in treating PTSD.  Doing eye movement desensitization and reprocessing therapy (EMDR). This type of therapy occurs with a specialized therapist. If you have other mental health concerns, these conditions will also be treated. Follow these instructions at home: Lifestyle  Find a support group in your community. Groups are often available for [employer] veterans, trauma victims, and family members or caregivers.  Try to get 7-9 hours of sleep each night. To help  with sleep: ? Keep your bedroom cool and dark. ? Do not eat a heavy meal within 1 hour of bedtime. ? Do not drink alcohol or caffeinated drinks before bed. ? Avoid screen time, such as  television, computers, tablets, or mobile phones, before bed.  Do not use illegal drugs.  Contact a local organization to find out if you are eligible for a service dog. Activity  Exercise regularly. Try to do at least 30 minutes of physical activity most days of the week.  Practice self-calming through: ? Breathing exercises. ? Meditation. ? Yoga. ? Listening to quiet music.  Do not isolate yourself. Make connections with other people.  Consider volunteering. Volunteering can help you feel more connected. Eating and drinking  Do not drink alcohol if: ? Your health care provider tells you not to drink. ? You are pregnant, may be pregnant, or are planning to become pregnant.  If you drink alcohol: ? Limit how much you use to:  0-1 drink a day for women.  0-2 drinks a day for men. ? Be aware of how much alcohol is in your drink. In the [place] one drink equals one 12 oz bottle of beer (355 mL), one 5 oz glass of wine (148 mL), or one 1 oz glass of hard liquor (44 mL). General instructions  Take steps to help yourself feel safer at home, such as by installing a security system.  Work with a health care provider or therapist to help manage your symptoms.  Take over-the-counter and prescription medicines as told by your health care provider.  Let others know that you have PTSD and the things that may trigger symptoms. This can protect you and help them understand you better.  If your PTSD is affecting your marriage or family, seek help from a family therapist.  Make sure to let all of your health care providers know you have PTSD. This is especially important if you are having surgery or need to be admitted to the hospital.  Keep all follow-up visits as told by your health care provider. This is important. Contact a health care provider if:  Your symptoms do not get better.  You are feeling overwhelmed by your symptoms. Get help right away if:  You have thoughts of  hurting yourself or others. If you ever feel like you may hurt yourself or others, or have thoughts about taking your own life, get help right away. You can go to your nearest emergency department or call:  Your local emergency services (911 in the [place]  A suicide crisis helpline, such as the [place] at (575) 573-6117. This is open 24 hours a day. Summary  Post-traumatic stress disorder (PTSD) is a mental health disorder that can occur after a traumatic event.  Treatment for PTSD may include medicines, counseling, eye movement desensitization and reprocessing therapy (EMDR), or a combination of therapies.  Find a support group in your community.  Get help right away if you have thoughts of hurting yourself or others. This information is not intended to replace advice given to you by your health care provider. Make sure you discuss any questions you have with your health care provider. Document Revised: 04/05/2018 Document Reviewed: 04/05/2018 Elsevier Patient Education  [place] Stress Disorder If you have been diagnosed with post-traumatic stress disorder (PTSD), you may be relieved that you now know why you have felt or behaved a certain way. Still, you may feel overwhelmed about the treatment ahead.  You may also wonder how to get the support you need and how to deal with the condition day-to-day. If you are living with PTSD, there are ways to help you recover from it and manage your symptoms. How to manage lifestyle changes Managing stress Stress is your body's reaction to life changes and events, both good and bad. Stress can make PTSD worse. Take the following steps to manage stress:  Talk with your health care provider or a counselor if you would like to learn more about techniques to reduce your stress. He or she may suggest some stress reduction techniques such as: ? Muscle relaxation exercises. ? Regular  exercise. ? Meditation, yoga, or other mind-body exercises. ? Breathing exercises. ? Listening to quiet music. ? Spending time outside.  Maintain a healthy lifestyle. Eat a healthy diet, exercise regularly, get plenty of sleep, and take time to relax.  Spend time with others. Talk with them about how you are feeling and what kind of support you need. Try not to isolate yourself, even though you may feel like doing that. Isolating yourself can delay your recovery.  Do activities and hobbies that you enjoy.  Pace yourself when doing stressful things. Take breaks, and reward yourself when you finish. Make sure that you do not overload your schedule.  Medicines Your health care provider may suggest certain medicines if he or she feels that they will help to improve your condition. Medicines for depression (antidepressants) or severe loss of contact with reality (antipsychotics) may be used to treat PTSD. Avoid using alcohol and other substances that may prevent your medicines from working properly. It is also important to:  Talk with your pharmacist or health care provider about all medicines that you take, their possible side effects, and which medicines are safe to take together.  Make it your goal to take part in all treatment decisions (shared decision-making). Ask about possible side effects of medicines that your health care provider recommends, and tell him or her how you feel about having those side effects. It is best if shared decision-making with your health care provider is part of your total treatment plan. If your health care provider prescribes a medicine, you may not notice the full benefits of it for 4-8 weeks. Most people who are treated for PTSD need to take medicine for at least 6-12 months after they feel better. If you are taking medicines as part of your treatment, do not stop taking medicines before you ask your health care provider if it is safe to stop. You may need to have  the medicine slowly decreased (tapered) over time to lower the risk of harmful side effects. Relationships Many people who have PTSD have difficulty trusting others. Make an effort to:  Take risks and develop trust with close friends and family members. Developing trust in others can help you feel safe and connect you with emotional support.  Be open and honest about your feelings.  Have fun and relax in safe spaces, such as with friends and family.  Think about going to couples counseling, family education classes, or family therapy. Your loved ones may not always know how to be supportive. Therapy can be helpful for everyone. How to recognize changes in your condition Be aware of your symptoms and how often you have them. The following symptoms mean that you need to seek help for your PTSD:  You feel suspicious and angry.  You have repeated flashbacks.  You avoid going out or being with  others.  You have an increasing number of fights with close friends or family members, such as your spouse.  You have thoughts about hurting yourself or others.  You cannot get relief from feelings of depression or anxiety. Follow these instructions at home: Lifestyle  Exercise regularly. Try to do 30 or more minutes of physical activity on most days of the week.  Try to get 7-9 hours of sleep each night. To help with sleep: ? Keep your bedroom cool and dark. ? Avoid screen time before bedtime. This means avoiding use of your TV, computer, tablet, and cell phone.  Practice self-soothing skills and use them daily.  Try to have fun and seek humor in your life. Eating and drinking  Do not eat a heavy meal during the hour before you go to bed.  Do not drink alcohol or caffeinated drinks before bed.  Avoid using alcohol or drugs. General instructions  If your PTSD is affecting your marriage or family, seek help from a family therapist.  Take over-the-counter and prescription medicines only  as told by your health care provider.  Make sure to let all of your health care providers know that you have PTSD. This is especially important if you are having surgery or need to be admitted to the hospital.  Keep all follow-up visits as told by your health care providers. This is important. Where to find support Talking to others  Explain that PTSD is a mental health problem. It is something that a person can develop after experiencing or seeing a life-threatening event. Tell them that PTSD makes you feel stress like you did during the event.  Talk to your loved ones about the symptoms you have. Also tell them what things or situations can cause symptoms to start (are triggers for you).  Assure your loved ones that there are treatments to help PTSD. Discuss possibly seeking family therapy or couples therapy.  If you are worried or fearful about seeking treatment, ask for support.  Keep daily contact with at least one trusted friend or family member. Finances Not all insurance plans cover mental health care, so it is important to check with your insurance carrier. If paying for co-pays or counseling services is a problem, search for a local or county mental health care center. Public mental health care services may be offered there at a low cost or no cost when you are not able to see a private health care provider. If you are a veteran, contact a local veterans organization or veterans hospital for more information. If you are taking medicine for PTSD, you may be able to get the genericform, which may be less expensive than brand-name medicine. Some makers of prescription medicines also offer help to patients who cannot afford the medicines that they need. Therapy and support groups  Find a support group in your community. Often, groups are available for [employer] veterans, trauma victims, and family members or caregivers.  Look into volunteer opportunities. Taking part in these can help  you feel more connected to your community.  Contact a local organization to find out if you are eligible for a service dog. Where to find more information Go to this website to find more information about PTSD, treatment of PTSD, and how to get support:  [facility] for PTSD: www.ptsd.PaintballBuzz.cz Contact a health care provider if:  Your symptoms get worse or do not get better. Get help right away if:  You have thoughts about hurting yourself or others. If  you ever feel like you may hurt yourself or others, or have thoughts about taking your own life, get help right away. You can go to your nearest emergency department or call:  Your local emergency services (911 in the [place]  A suicide crisis helpline, such as the [place] at 580-121-6144. This is open 24-hours a day. Summary  If you are living with PTSD, there are ways to help you recover from it and manage your symptoms.  Find supportive environments and people who understand PTSD. Spend time in those places, and maintain contact with those people.  Work with your health care team to create a plan for managing PTSD. The plan should include counseling, stress reduction techniques, and healthy lifestyle habits. This information is not intended to replace advice given to you by your health care provider. Make sure you discuss any questions you have with your health care provider. Document Revised: 05/18/2018 Document Reviewed: 05/26/2016 Elsevier Patient Education  [employer].

## 2019-12-18 NOTE — Progress Notes (Signed)
Chief Complaint  Patient presents with  . Follow-up  . Immunizations    get flu shot, discuss pnuemonia and booster shots.   . Post-Traumatic Stress Disorder   F/u  1. PTSD with anxiety and nightmares at times acting out dreams, sleep talking and waking his wife up he reached out to [facility] [phone] for resources  He is former Nature conservation officer but [facility] [place] will [facility] there for [facility] and have pt call he will likely need to make PCP appt there to get referral if he does not want to do this will also refer [facility] in [place] for psych and therapy 2. Inflamed cyst on back appt dermatology 02/2020 will try to get in sooner Dr. Kellie Moor faxed cover letter with need to see sooner   3. Flu shot given today also wants to disc pna shot and covid 19 booster  4. S/p covid 19 11/06/19 s/p 2/2 pfizer vaccines  did not get mab infusion but doing better since he thinks contracted at church where he had not been wearing a mask but is going to in the future he and his family     Review of Systems  Constitutional: Negative for weight loss.  HENT: Negative for hearing loss.   Eyes: Negative for blurred vision.  Respiratory: Negative for cough and shortness of breath.   Cardiovascular: Negative for chest pain.  Musculoskeletal: Negative for falls.  Skin:       +inflamed cyst to back   Psychiatric/Behavioral: The patient is nervous/anxious.        +nightmares and PTSD     Past Medical History:  Diagnosis Date  . Allergy   . Chicken pox   . Colon polyps   . COVID-19    11/06/19 did not have mab infusion  . Diabetes mellitus without complication ([place])   . Hyperlipidemia   . Hypertension    Past Surgical History:  Procedure Laterality Date  . TONSILLECTOMY     Family History  Problem Relation Age of Onset  . Cancer Mother        bone, liver, lung former smoker ? lung died 28-May-2008  . Diabetes  Mother   . Hypertension Mother   . Atrial fibrillation Sister    Social History   Socioeconomic History  . Marital status: Married    Spouse name: Not on file  . Number of children: Not on file  . Years of education: Not on file  . Highest education level: Not on file  Occupational History  . Not on file  Tobacco Use  . Smoking status: Former Research scientist (life sciences)  . Smokeless tobacco: Never Used  Substance and Sexual Activity  . Alcohol use: Yes    Alcohol/week: 1.0 standard drink    Types: 1 Standard drinks or equivalent per week    Comment: occasional, maybe once a week  . Drug use: No  . Sexual activity: Yes    Partners: Female  Other Topics Concern  . Not on file  Social History Narrative   Wears selt belt    Safe in relationship    Recently moved from [facility] 2018/19 originally from [facility] dad and sister 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    Retired Occupational hygienist degree    Social Determinants of Health   Financial Resource Strain: Low Risk   . Difficulty of Paying Living Expenses: Not hard at all  Food Insecurity: No  Food Insecurity  . Worried About Charity fundraiser in the Last Year: Never true  . Ran Out of Food in the Last Year: Never true  Transportation Needs: No Transportation Needs  . Lack of Transportation (Medical): No  . Lack of Transportation (Non-Medical): No  Physical Activity: Insufficiently Active  . Days of Exercise per Week: 3 days  . Minutes of Exercise per Session: 30 min  Stress: No Stress Concern Present  . Feeling of Stress : Not at all  Social Connections: Unknown  . Frequency of Communication with Friends and Family: Not on file  . Frequency of Social Gatherings with Friends and Family: Not on file  . Attends Religious Services: Not on file  . Active Member of Clubs or Organizations: Not on file  . Attends Archivist Meetings: Not on file  . Marital Status: Married  Human resources officer Violence: Not At Risk  . Fear of Current or Ex-Partner:  No  . Emotionally Abused: No  . Physically Abused: No  . Sexually Abused: No   Current Meds  Medication Sig  . amLODipine (NORVASC) 2.5 MG tablet Take 1 tablet (2.5 mg total) by mouth daily.  Marland Kitchen aspirin EC 81 MG tablet Take 1 tablet (81 mg total) by mouth daily.  Marland Kitchen atorvastatin (LIPITOR) 20 MG tablet Take 1 tablet (20 mg total) by mouth daily at 6 PM.  . benzonatate (TESSALON) 200 MG capsule Take 1 capsule (200 mg total) by mouth 3 (three) times daily as needed for cough.  . cetirizine (ZYRTEC) 10 MG tablet Take 1 tablet (10 mg total) by mouth daily.  . cholecalciferol (VITAMIN D3) 25 MCG (1000 UNIT) tablet Take 1,000 Units by mouth daily.  . Cinnamon 500 MG TABS Take by mouth.  . cyclobenzaprine (FLEXERIL) 5 MG tablet Take 1-2 tablets (5-10 mg total) by mouth at bedtime as needed for muscle spasms.  Marland Kitchen glucose blood (FREESTYLE LITE) test strip Use as instructed qd to bid E11.9  . ibuprofen (ADVIL) 200 MG tablet Take 1-2 tablets (200-400 mg total) by mouth every 8 (eight) hours as needed.  . Lancets (FREESTYLE) lancets Qd to bid Use as instructed E11.9  . metFORMIN (GLUCOPHAGE) 1000 MG tablet Take 1 tablet (1,000 mg total) by mouth 2 (two) times daily with a meal.  . Multiple Vitamin (MULTIVITAMIN) tablet Take 1 tablet by mouth daily.  . predniSONE (DELTASONE) 20 MG tablet Take 2 tablets (40 mg total) by mouth daily with breakfast. x5-7 days  . sitaGLIPtin (JANUVIA) 100 MG tablet Take 1 tablet (100 mg total) by mouth daily.   Allergies  Allergen Reactions  . Bactrim [Sulfamethoxazole-Trimethoprim]     Red face   . Biofreeze [Menthol (Topical Analgesic)]     Rash    Recent Results (from the past 2160 hour(s))  PSA, Medicare ( [place] only)     Status: None   Collection Time: 12/11/19  8:03 AM  Result Value Ref Range   PSA 2.53 0.10 - 4.00 ng/ml    Comment: Test performed using Access Hybritech PSA Assay, a parmagnetic partical, chemiluminecent immunoassay.  Vitamin D (25  hydroxy)     Status: None   Collection Time: 12/11/19  8:03 AM  Result Value Ref Range   VITD 51.40 30.00 - 100.00 ng/mL  Urinalysis, Routine w reflex microscopic     Status: None   Collection Time: 12/11/19  8:03 AM  Result Value Ref Range   Color, Urine YELLOW YELLOW   APPearance CLEAR CLEAR  Specific Gravity, Urine 1.015 1.001 - 1.03   pH 5.5 5.0 - 8.0   Glucose, UA NEGATIVE NEGATIVE   Bilirubin Urine NEGATIVE NEGATIVE   Ketones, ur NEGATIVE NEGATIVE   Hgb urine dipstick NEGATIVE NEGATIVE   Protein, ur NEGATIVE NEGATIVE   Nitrite NEGATIVE NEGATIVE   Leukocytes,Ua NEGATIVE NEGATIVE  TSH     Status: None   Collection Time: 12/11/19  8:03 AM  Result Value Ref Range   TSH 1.87 0.35 - 4.50 uIU/mL  CBC w/Diff     Status: None   Collection Time: 12/11/19  8:03 AM  Result Value Ref Range   WBC 6.0 4.0 - 10.5 K/uL   RBC 4.37 4.22 - 5.81 Mil/uL   Hemoglobin 13.2 13.0 - 17.0 g/dL   HCT 39.4 39 - 52 %   MCV 90.0 78.0 - 100.0 fl   MCHC 33.5 30.0 - 36.0 g/dL   RDW 13.9 11.5 - 15.5 %   Platelets 253.0 150 - 400 K/uL   Neutrophils Relative % 54.8 43 - 77 %   Lymphocytes Relative 33.2 12 - 46 %   Monocytes Relative 9.8 3 - 12 %   Eosinophils Relative 1.6 0 - 5 %   Basophils Relative 0.6 0 - 3 %   Neutro Abs 3.3 1.4 - 7.7 K/uL   Lymphs Abs 2.0 0.7 - 4.0 K/uL   Monocytes Absolute 0.6 0.1 - 1.0 K/uL   Eosinophils Absolute 0.1 0.0 - 0.7 K/uL   Basophils Absolute 0.0 0.0 - 0.1 K/uL  Lipid panel     Status: None   Collection Time: 12/11/19  8:03 AM  Result Value Ref Range   Cholesterol 109 0 - 200 mg/dL    Comment: ATP III Classification       Desirable:  < 200 mg/dL               Borderline High:  200 - 239 mg/dL          High:  > = 240 mg/dL   Triglycerides 99.0 0 - 149 mg/dL    Comment: Normal:  <150 mg/dLBorderline High:  150 - 199 mg/dL   HDL 39.10 >39.00 mg/dL   VLDL 19.8 0.0 - 40.0 mg/dL   LDL Cholesterol 50 0 - 99 mg/dL   Total CHOL/HDL Ratio 3     Comment:                 Men          Women1/2 Average Risk     3.4          3.3Average Risk          5.0          4.42X Average Risk          9.6          7.13X Average Risk          15.0          11.0                       NonHDL 70.11     Comment: NOTE:  Non-HDL goal should be 30 mg/dL higher than patient's LDL goal (i.e. LDL goal of < 70 mg/dL, would have non-HDL goal of < 100 mg/dL)  Comprehensive metabolic panel     Status: Abnormal   Collection Time: 12/11/19  8:03 AM  Result Value Ref Range   Sodium 137 135 - 145 mEq/L  Potassium 4.5 3.5 - 5.1 mEq/L   Chloride 104 96 - 112 mEq/L   CO2 24 19 - 32 mEq/L   Glucose, Bld 121 (H) 70 - 99 mg/dL   BUN 17 6 - 23 mg/dL   Creatinine, Ser 1.08 0.40 - 1.50 mg/dL   Total Bilirubin 0.8 0.2 - 1.2 mg/dL   Alkaline Phosphatase 71 39 - 117 U/L   AST 21 0 - 37 U/L   ALT 19 0 - 53 U/L   Total Protein 6.8 6.0 - 8.3 g/dL   Albumin 4.3 3.5 - 5.2 g/dL   GFR 70.44 >60.00 mL/min    Comment: Calculated using the CKD-EPI Creatinine Equation (2021)   Calcium 8.9 8.4 - 10.5 mg/dL  POCT HgB A1C     Status: Abnormal   Collection Time: 12/11/19  8:24 AM  Result Value Ref Range   Hemoglobin A1C 6.8 (A) 4.0 - 5.6 %   HbA1c POC (<> result, manual entry)     HbA1c, POC (prediabetic range)     HbA1c, POC (controlled diabetic range)     Objective  Body mass index is 26.55 kg/m. Wt Readings from Last 3 Encounters:  12/18/19 179 lb 12.8 oz (81.6 kg)  12/12/19 175 lb (79.4 kg)  11/08/19 174 lb (78.9 kg)   Temp Readings from Last 3 Encounters:  12/18/19 (!) 97.4 F (36.3 C) (Oral)  11/08/19 99 F (37.2 C) (Oral)  08/22/19 98.7 F (37.1 C) (Oral)   BP Readings from Last 3 Encounters:  12/18/19 126/82  12/12/19 128/73  11/08/19 (!) 143/80   Pulse Readings from Last 3 Encounters:  12/18/19 95  11/08/19 97  08/22/19 (!) 110    Physical Exam Vitals and nursing note reviewed.  Constitutional:      Appearance: Normal appearance. He is well-developed, well-groomed and  overweight.  HENT:     Head: Normocephalic and atraumatic.  Eyes:     Conjunctiva/sclera: Conjunctivae normal.     Pupils: Pupils are equal, round, and reactive to light.  Cardiovascular:     Rate and Rhythm: Normal rate and regular rhythm.     Heart sounds: Normal heart sounds. No murmur heard.   Pulmonary:     Effort: Pulmonary effort is normal.     Breath sounds: Normal breath sounds.  Skin:    General: Skin is warm and dry.       Neurological:     General: No focal deficit present.     Mental Status: He is alert and oriented to person, place, and time. Mental status is at baseline.     Gait: Gait normal.  Psychiatric:        Attention and Perception: Attention and perception normal.        Mood and Affect: Mood and affect normal.        Speech: Speech normal.        Behavior: Behavior normal. Behavior is cooperative.        Thought Content: Thought content normal.        Cognition and Memory: Cognition and memory normal.        Judgment: Judgment normal.     Assessment  Plan  Chronic post-traumatic stress disorder (PTSD) - Plan: Ambulatory referral to Psychiatry, Ambulatory referral to Psychology  Anxiety - Plan: Ambulatory referral to Psychiatry, Ambulatory referral to Psychology Nightmares - Plan: Ambulatory referral to Psychiatry, Ambulatory referral to Psychology  [facility]  Inflamed sebaceous cyst - Plan: doxycycline (VIBRA-TABS) 100 MG tablet  See if Dr. Kellie Moor can see sooner than 02/2020 to remove  DM2 with HTN controlled both  A1C 6.8  On norvasc 2.5 mg qd lipitor 20 mg qhs metformin 1000 mg bid, januvia 100 mg qd  Eye exam utd 05/2019   HM Labs 12/11/19 reviewed A1C 6.8  Flu shotutdgiven today Tdap utd  prevnar utd -will need pna 23 due 03/2020 covid vx 2/2 covid 19 + 11/06/19 will need to wait until 02/05/20 to get booster pfizer   had 2/2 shingrix vaccines   DRE in  futurePSA 12/11/19 2.53   colonoscopy had 11/23/15 moderate to severe diverticulosis 2 polyps no pathology report noted Dr. Rexene Alberts [facility] requested recordsfor pathology report -rec repeat in 5 years with h/o polyps  Hep C neg   dermatologysawDr. Kellie Moor tbse multiple nevidue to see again 1/9/2020saw cyst removal back 03/07/2018 sch currently 02/2020 will see if can seen sooner   02/2019 nl tbse  02/13/20 dermatology   Eye exam AE 05/29/19    Provider: Dr. Olivia Mackie McLean-Scocuzza-Internal Medicine

## 2019-12-20 ENCOUNTER — Telehealth: Payer: Self-pay | Admitting: Internal Medicine

## 2019-12-20 NOTE — Telephone Encounter (Signed)
Faxed doctor notes to [facility] faxed on 12-19-2019

## 2019-12-23 DIAGNOSIS — D485 Neoplasm of uncertain behavior of skin: Secondary | ICD-10-CM | POA: Diagnosis not present

## 2019-12-23 DIAGNOSIS — L728 Other follicular cysts of the skin and subcutaneous tissue: Secondary | ICD-10-CM | POA: Diagnosis not present

## 2019-12-23 DIAGNOSIS — L929 Granulomatous disorder of the skin and subcutaneous tissue, unspecified: Secondary | ICD-10-CM | POA: Diagnosis not present

## 2019-12-24 DIAGNOSIS — E1159 Type 2 diabetes mellitus with other circulatory complications: Secondary | ICD-10-CM | POA: Insufficient documentation

## 2019-12-24 DIAGNOSIS — I152 Hypertension secondary to endocrine disorders: Secondary | ICD-10-CM | POA: Insufficient documentation

## 2019-12-27 ENCOUNTER — Other Ambulatory Visit: Payer: Self-pay

## 2019-12-27 ENCOUNTER — Encounter: Payer: Self-pay | Admitting: Internal Medicine

## 2019-12-27 ENCOUNTER — Telehealth (INDEPENDENT_AMBULATORY_CARE_PROVIDER_SITE_OTHER): Payer: Medicare Other | Admitting: Internal Medicine

## 2019-12-27 VITALS — BP 140/81 | HR 107 | Ht 69.0 in | Wt 174.0 lb

## 2019-12-27 DIAGNOSIS — R0981 Nasal congestion: Secondary | ICD-10-CM | POA: Diagnosis not present

## 2019-12-27 DIAGNOSIS — I700 Atherosclerosis of aorta: Secondary | ICD-10-CM

## 2019-12-27 DIAGNOSIS — J40 Bronchitis, not specified as acute or chronic: Secondary | ICD-10-CM | POA: Diagnosis not present

## 2019-12-27 MED ORDER — ALBUTEROL SULFATE HFA 108 (90 BASE) MCG/ACT IN AERS: 1.0000 | INHALATION_SPRAY | Freq: Four times a day (QID) | RESPIRATORY_TRACT | 0 refills | Status: DC | PRN

## 2019-12-27 MED ORDER — AZITHROMYCIN 250 MG PO TABS: ORAL_TABLET | ORAL | 0 refills | Status: DC

## 2019-12-27 MED ORDER — SALINE SPRAY 0.65 % NA SOLN: 1.0000 | NASAL | 2 refills | Status: AC | PRN

## 2019-12-27 MED ORDER — FLUTICASONE PROPIONATE 50 MCG/ACT NA SUSP: 2.0000 | Freq: Every day | NASAL | 2 refills | Status: DC

## 2019-12-27 MED ORDER — HYDROCOD POLST-CPM POLST ER 10-8 MG/5ML PO SUER: 5.0000 mL | Freq: Every evening | ORAL | 0 refills | Status: DC | PRN

## 2019-12-27 MED ORDER — DM-GUAIFENESIN ER 60-1200 MG PO TB12: 1.0000 | ORAL_TABLET | Freq: Two times a day (BID) | ORAL | 0 refills | Status: DC

## 2019-12-27 NOTE — Progress Notes (Signed)
Virtual Visit via Video Note  I connected with Andrew Gonzalez  on 12/27/19 at 11:50 AM EST by a video enabled telemedicine application and verified that I am speaking with the correct person using two identifiers.  Location patient: home, [place] Location provider:work or home office Persons participating in the virtual visit: patient, provider  I discussed the limitations of evaluation and management by telemedicine and the availability of in person appointments. The patient expressed understanding and agreed to proceed.   HPI: Since flu shot 12/18/19 had h/a, nasal congestion, cough today with some phelgm clear mucous tessalon perles w/o help, denies wheezing, sob, fever, no h/o asthma energy ok and appetite ok this has happened to him in the past with prior flu shots  He takes zyrtec daily    ROS: See pertinent positives and negatives per HPI.  Past Medical History:  Diagnosis Date  . Allergy   . Chicken pox   . Colon polyps   . COVID-19    11/06/19 did not have mab infusion  . Diabetes mellitus without complication ([place])   . Hyperlipidemia   . Hypertension     Past Surgical History:  Procedure Laterality Date  . TONSILLECTOMY       Current Outpatient Medications:  .  amLODipine (NORVASC) 2.5 MG tablet, Take 1 tablet (2.5 mg total) by mouth daily., Disp: 90 tablet, Rfl: 3 .  aspirin EC 81 MG tablet, Take 1 tablet (81 mg total) by mouth daily., Disp: 90 tablet, Rfl: 3 .  atorvastatin (LIPITOR) 20 MG tablet, Take 1 tablet (20 mg total) by mouth daily at 6 PM., Disp: 90 tablet, Rfl: 3 .  benzonatate (TESSALON) 200 MG capsule, Take 1 capsule (200 mg total) by mouth 3 (three) times daily as needed for cough., Disp: 60 capsule, Rfl: 1 .  cetirizine (ZYRTEC) 10 MG tablet, Take 1 tablet (10 mg total) by mouth daily., Disp: 90 tablet, Rfl: 3 .  cholecalciferol (VITAMIN D3) 25 MCG (1000 UNIT) tablet, Take 1,000 Units by mouth daily., Disp: , Rfl:  .  Cinnamon 500 MG TABS, Take by mouth.,  Disp: , Rfl:  .  cyclobenzaprine (FLEXERIL) 5 MG tablet, Take 1-2 tablets (5-10 mg total) by mouth at bedtime as needed for muscle spasms., Disp: 60 tablet, Rfl: 2 .  doxycycline (VIBRA-TABS) 100 MG tablet, Take 1 tablet (100 mg total) by mouth 2 (two) times daily. With food x 7-10 days, Disp: 20 tablet, Rfl: 0 .  glucose blood (FREESTYLE LITE) test strip, Use as instructed qd to bid E11.9, Disp: 180 each, Rfl: 12 .  ibuprofen (ADVIL) 200 MG tablet, Take 1-2 tablets (200-400 mg total) by mouth every 8 (eight) hours as needed., Disp: 60 tablet, Rfl: 0 .  Lancets (FREESTYLE) lancets, Qd to bid Use as instructed E11.9, Disp: 200 each, Rfl: 3 .  metFORMIN (GLUCOPHAGE) 1000 MG tablet, Take 1 tablet (1,000 mg total) by mouth 2 (two) times daily with a meal., Disp: 180 tablet, Rfl: 3 .  Multiple Vitamin (MULTIVITAMIN) tablet, Take 1 tablet by mouth daily., Disp: , Rfl:  .  sitaGLIPtin (JANUVIA) 100 MG tablet, Take 1 tablet (100 mg total) by mouth daily., Disp: 90 tablet, Rfl: 3 .  albuterol (VENTOLIN HFA) 108 (90 Base) MCG/ACT inhaler, Inhale 1-2 puffs into the lungs every 6 (six) hours as needed for wheezing or shortness of breath., Disp: 18 g, Rfl: 0 .  azithromycin (ZITHROMAX) 250 MG tablet, 2 pills day 1 and 1 pill day 2-5 with food, Disp: 6  tablet, Rfl: 0 .  chlorpheniramine-HYDROcodone (TUSSIONEX PENNKINETIC ER) 10-8 MG/5ML SUER, Take 5 mLs by mouth at bedtime as needed for cough., Disp: 115 mL, Rfl: 0 .  Dextromethorphan-Guaifenesin 60-1200 MG 12hr tablet, Take 1 tablet by mouth every 12 (twelve) hours., Disp: 30 tablet, Rfl: 0 .  fluticasone (FLONASE) 50 MCG/ACT nasal spray, Place 2 sprays into both nostrils daily., Disp: 16 g, Rfl: 2 .  sodium chloride (OCEAN) 0.65 % SOLN nasal spray, Place 1 spray into both nostrils as needed for congestion., Disp: 30 mL, Rfl: 2  EXAM:  VITALS per patient if applicable:  GENERAL: alert, oriented, appears well and in no acute distress  HEENT: atraumatic,  conjunttiva clear, no obvious abnormalities on inspection of external nose and ears  NECK: normal movements of the head and neck  LUNGS: on inspection no signs of respiratory distress, breathing rate appears normal, no obvious gross SOB, gasping or wheezing  CV: no obvious cyanosis  MS: moves all visible extremities without noticeable abnormality  PSYCH/NEURO: pleasant and cooperative, no obvious depression or anxiety, speech and thought processing grossly intact  ASSESSMENT AND PLAN:  Discussed the following assessment and plan:  Bronchitis - Plan: azithromycin (ZITHROMAX) 250 MG tablet, Dextromethorphan-Guaifenesin 60-1200 MG 12hr tablet, fluticasone (FLONASE) 50 MCG/ACT nasal spray, sodium chloride (OCEAN) 0.65 % SOLN nasal spray, chlorpheniramine-HYDROcodone (TUSSIONEX PENNKINETIC ER) 10-8 MG/5ML SUER, albuterol (VENTOLIN HFA) 108 (90 Base) MCG/ACT inhaler  Nasal congestion - Plan: fluticasone (FLONASE) 50 MCG/ACT nasal spray, sodium chloride (OCEAN) 0.65 % SOLN nasal spray   HM Labs 12/11/19 reviewed A1C 6.8  Flu shotutdgiven today Tdap utd  prevnar utd -will need pna 23 due 03/2020 covid vx 2/2covid 19 + 11/06/19 will need to wait until 02/05/20 to get booster pfizer   had 2/2 shingrix vaccines   DRE in futurePSA 12/11/19 2.53   colonoscopy had 11/23/15 moderate to severe diverticulosis 2 polyps no pathology report noted Dr. Rexene Alberts [facility] requested recordsfor pathology report -rec repeat in 5 years with h/o polyps  Hep C neg  dermatologysawDr. Kellie Moor tbse multiple nevidue to see again 1/9/2020saw cyst removal back 03/07/2018 sch currently 02/2020 will see if can seen sooner   02/2019 nl tbse 02/13/20 dermatology   Eye exam AE 05/29/19 -we discussed possible serious and likely etiologies, options for evaluation and workup, limitations of telemedicine visit vs in person visit, treatment, treatment risks and precautions.   I  discussed the assessment and treatment plan with the patient. The patient was provided an opportunity to ask questions and all were answered. The patient agreed with the plan and demonstrated an understanding of the instructions.    Time spent 20 minutes  Delorise Jackson, MD

## 2019-12-27 NOTE — Telephone Encounter (Signed)
Patient scheduled for video visit at 11:30 today

## 2019-12-30 ENCOUNTER — Other Ambulatory Visit: Payer: Self-pay

## 2019-12-30 ENCOUNTER — Ambulatory Visit
Admission: RE | Admit: 2019-12-30 | Discharge: 2019-12-30 | Disposition: A | Discharge status: Home / Self Care | Payer: Medicare Other | Attending: Internal Medicine | Admitting: Internal Medicine

## 2019-12-30 ENCOUNTER — Ambulatory Visit
Admission: RE | Admit: 2019-12-30 | Discharge: 2019-12-30 | Disposition: A | Discharge status: Home / Self Care | Payer: Medicare Other | Source: Ambulatory Visit | Attending: Internal Medicine | Admitting: Internal Medicine

## 2019-12-30 DIAGNOSIS — U071 COVID-19: Secondary | ICD-10-CM | POA: Diagnosis not present

## 2019-12-30 DIAGNOSIS — R059 Cough, unspecified: Secondary | ICD-10-CM | POA: Diagnosis not present

## 2019-12-30 DIAGNOSIS — J90 Pleural effusion, not elsewhere classified: Secondary | ICD-10-CM | POA: Diagnosis not present

## 2020-01-01 ENCOUNTER — Encounter: Payer: Self-pay | Admitting: Internal Medicine

## 2020-01-01 NOTE — Progress Notes (Signed)
Your chest x ray is clear.  There is no sign of pneumonia or mass,; however, your  aorta was noted to have calcifications,  which places you at increased risk for strokes and heart attacks.  Please follow up with Dr Aundra Dubin to discuss this finding in an office visit.   Regards,   Deborra Medina, MD

## 2020-01-06 ENCOUNTER — Encounter: Payer: Self-pay | Admitting: Internal Medicine

## 2020-01-06 DIAGNOSIS — I700 Atherosclerosis of aorta: Secondary | ICD-10-CM | POA: Insufficient documentation

## 2020-01-06 NOTE — Addendum Note (Signed)
Addended by: Orland Mustard on: 01/06/2020 04:16 PM   Modules accepted: Orders

## 2020-01-10 ENCOUNTER — Telehealth: Payer: Self-pay | Admitting: Internal Medicine

## 2020-01-10 ENCOUNTER — Ambulatory Visit (INDEPENDENT_AMBULATORY_CARE_PROVIDER_SITE_OTHER): Payer: Medicare Other | Admitting: Internal Medicine

## 2020-01-10 ENCOUNTER — Other Ambulatory Visit: Payer: Self-pay

## 2020-01-10 ENCOUNTER — Encounter: Payer: Self-pay | Admitting: Internal Medicine

## 2020-01-10 VITALS — BP 148/84 | HR 105 | Ht 69.0 in | Wt 176.0 lb

## 2020-01-10 DIAGNOSIS — R0602 Shortness of breath: Secondary | ICD-10-CM

## 2020-01-10 DIAGNOSIS — I700 Atherosclerosis of aorta: Secondary | ICD-10-CM

## 2020-01-10 DIAGNOSIS — R000 Tachycardia, unspecified: Secondary | ICD-10-CM | POA: Diagnosis not present

## 2020-01-10 DIAGNOSIS — E785 Hyperlipidemia, unspecified: Secondary | ICD-10-CM

## 2020-01-10 DIAGNOSIS — I10 Essential (primary) hypertension: Secondary | ICD-10-CM | POA: Diagnosis not present

## 2020-01-10 NOTE — Patient Instructions (Signed)
Medication Instructions:  Your physician recommends that you continue on your current medications as directed. Please refer to the Current Medication list given to you today.  *If you need a refill on your cardiac medications before your next appointment, please call your pharmacy*   Lab Work: None Ordered   Testing/Procedures:  1. Your physician has requested that you have an abdominal aorta duplex. During this test, an ultrasound is used to evaluate the aorta. Allow 30 minutes for this exam. Do not eat after midnight the day before and avoid carbonated beverages   2.  [place]       Your caregiver has ordered a Stress Test with nuclear imaging. The purpose of this test is to evaluate the blood supply to your heart muscle. This procedure is referred to as a "Non-Invasive Stress Test." This is because other than having an IV started in your vein, nothing is inserted or "invades" your body. Cardiac stress tests are done to find areas of poor blood flow to the heart by determining the extent of coronary artery disease (CAD). Some patients exercise on a treadmill, which naturally increases the blood flow to your heart, while others who are  unable to walk on a treadmill due to physical limitations have a pharmacologic/chemical stress agent called Lexiscan . This medicine will mimic walking on a treadmill by temporarily increasing your coronary blood flow.      PLEASE REPORT TO [facility] MEDICAL MALL ENTRANCE   THE VOLUNTEERS AT THE FIRST DESK WILL DIRECT YOU WHERE TO GO     *Please note: these test may take anywhere between 2-4 hours to complete       Date of Procedure:_____________________________________   Arrival Time for Procedure:______________________________    PLEASE NOTIFY THE OFFICE AT LEAST 24 HOURS IN ADVANCE IF YOU ARE UNABLE TO KEEP YOUR APPOINTMENT.  [place] 24 HOURS IN ADVANCE IF YOU ARE UNABLE TO KEEP YOUR  APPOINTMENT. [phone]         How to prepare for your Myoview test:         _XX__:  Hold diabetes medication the morning of procedure: metFORMIN (GLUCOPHAGE)  ____:  Hold betablocker(s) night before procedure and morning of procedure:     ____:  Hold other medications as follows:     1. Do not eat or drink after midnight  2. No caffeine for 24 hours prior to test  3. No smoking 24 hours prior to test.  4. Unless instructed otherwise, Take your medication with a small sips of water.        Follow-Up: At [facility], you and your health needs are our priority.  As part of our continuing mission to provide you with exceptional heart care, we have created designated Provider Care Teams.  These Care Teams include your primary Cardiologist (physician) and Advanced Practice Providers (APPs -  Physician Assistants and Nurse Practitioners) who all work together to provide you with the care you need, when you need it.  We recommend signing up for the patient portal called "MyChart".  Sign up information is provided on this After Visit Summary.  MyChart is used to connect with patients for Virtual Visits (Telemedicine).  Patients are able to view lab/test results, encounter notes, upcoming appointments, etc.  Non-urgent messages can be sent to your provider as well.   To learn more about what you can do with MyChart, go to [email or web address].    Your next appointment:  1 year(s)  The format for your next appointment:   In Person  Provider:   Nelva Bush, MD   Other Instructions

## 2020-01-10 NOTE — Telephone Encounter (Signed)
Where are we on psychiatry referral call patient? Will he reach out to [place] in [place] or go to [place]?

## 2020-01-10 NOTE — Telephone Encounter (Signed)
Fax was received from [facility] stated they spoke to pt on 01/08/2020 who advised they'd call out reg. Dept once ready as we require $150.00 reg fee prior to booking. They stated they hold referrals 30 days from contact.  Please advise and Thank you!

## 2020-01-10 NOTE — Progress Notes (Signed)
New Outpatient Visit Date: 01/10/2020  Referring Provider: McLean-Scocuzza, Nino Glow, MD [place],  [place] [address]  Chief Complaint: Shortness of breath and cough  HPI:  [name] is a 69 y.o. male who is being seen today for the evaluation of aortic atherosclerosis incidentally noted on chest radiograph at the request of Dr. Terese Door. He has a history of hypertension, hyperlipidemia, diabetes mellitus, and COVID-19 infection (10/2019; previously vaccinated). He recently had recurrence of shortness of breath and cough, attributed to bronchitis.  Chest radiograph for further evaluation of these symptoms demonstrated aortic atherosclerotic calcification.  Cough and dyspnea are improving but have not completely resolved.  [name] denies chest pain, palpitations, edema, or orthopnea.  He has been taking an inhaler at night to help with his lingering symptoms.  He reports having undergone a treadmill stress test ~8 years ago for evaluation of numbness in his feet.  He believes the test was normal.  He has not undergone any other cardiac testing.  He notes that his resting heart rate is always a little elevated, frequently at or above 100 bpm.  --------------------------------------------------------------------------------------------------  Cardiovascular History & Procedures: Cardiovascular Problems:  Aortic atherosclerosis  Shortness of breath  Risk Factors:  Aortic atherosclerosis, hypertension, hyperlipidemia, diabetes mellitus, male gender, age > 32, and prior tobacco use  Cath/PCI:  None  CV Surgery:  None  EP Procedures and Devices:  None  Non-Invasive Evaluation(s):  None  Recent CV Pertinent Labs: Lab Results  Component Value Date   CHOL 109 12/11/2019   HDL 39.10 12/11/2019   LDLCALC 50 12/11/2019   TRIG 99.0 12/11/2019   CHOLHDL 3 12/11/2019   K 4.5 12/11/2019   MG 1.3 (L) 10/18/2018   BUN 17 12/11/2019   BUN 19 11/06/2018    CREATININE 1.08 12/11/2019    --------------------------------------------------------------------------------------------------  Past Medical History:  Diagnosis Date  . Allergy   . Chicken pox   . Colon polyps   . COVID-19    11/06/19 did not have mab infusion  . Diabetes mellitus without complication ([place])   . Hyperlipidemia   . Hypertension     Past Surgical History:  Procedure Laterality Date  . TONSILLECTOMY      Current Meds  Medication Sig  . albuterol (VENTOLIN HFA) 108 (90 Base) MCG/ACT inhaler Inhale 1-2 puffs into the lungs every 6 (six) hours as needed for wheezing or shortness of breath.  Marland Kitchen amLODipine (NORVASC) 2.5 MG tablet Take 1 tablet (2.5 mg total) by mouth daily.  Marland Kitchen aspirin EC 81 MG tablet Take 1 tablet (81 mg total) by mouth daily.  Marland Kitchen atorvastatin (LIPITOR) 20 MG tablet Take 1 tablet (20 mg total) by mouth daily at 6 PM.  . azithromycin (ZITHROMAX) 250 MG tablet 2 pills day 1 and 1 pill day 2-5 with food  . benzonatate (TESSALON) 200 MG capsule Take 1 capsule (200 mg total) by mouth 3 (three) times daily as needed for cough.  . cetirizine (ZYRTEC) 10 MG tablet Take 1 tablet (10 mg total) by mouth daily.  . chlorpheniramine-HYDROcodone (TUSSIONEX PENNKINETIC ER) 10-8 MG/5ML SUER Take 5 mLs by mouth at bedtime as needed for cough.  . cholecalciferol (VITAMIN D3) 25 MCG (1000 UNIT) tablet Take 1,000 Units by mouth daily.  . Cinnamon 500 MG TABS Take by mouth.  . cyclobenzaprine (FLEXERIL) 5 MG tablet Take 1-2 tablets (5-10 mg total) by mouth at bedtime as needed for muscle spasms.  . Dextromethorphan-Guaifenesin 60-1200 MG 12hr tablet Take 1 tablet by  mouth every 12 (twelve) hours.  Marland Kitchen doxycycline (VIBRA-TABS) 100 MG tablet Take 1 tablet (100 mg total) by mouth 2 (two) times daily. With food x 7-10 days  . fluticasone (FLONASE) 50 MCG/ACT nasal spray Place 2 sprays into both nostrils daily.  Marland Kitchen glucose blood (FREESTYLE LITE) test strip Use as instructed qd to bid  E11.9  . ibuprofen (ADVIL) 200 MG tablet Take 1-2 tablets (200-400 mg total) by mouth every 8 (eight) hours as needed.  . Lancets (FREESTYLE) lancets Qd to bid Use as instructed E11.9  . metFORMIN (GLUCOPHAGE) 1000 MG tablet Take 1 tablet (1,000 mg total) by mouth 2 (two) times daily with a meal.  . Multiple Vitamin (MULTIVITAMIN) tablet Take 1 tablet by mouth daily.  . sitaGLIPtin (JANUVIA) 100 MG tablet Take 1 tablet (100 mg total) by mouth daily.  . sodium chloride (OCEAN) 0.65 % SOLN nasal spray Place 1 spray into both nostrils as needed for congestion.    Allergies: Bactrim [sulfamethoxazole-trimethoprim] and Biofreeze [menthol (topical analgesic)]  Social History   Tobacco Use  . Smoking status: Former Smoker    Packs/day: 0.50    Years: 20.00    Pack years: 10.00    Types: Cigarettes    Quit date: 04/29/2005    Years since quitting: 14.9  . Smokeless tobacco: Never Used  Substance Use Topics  . Alcohol use: Yes    Alcohol/week: 1.0 standard drink    Types: 1 Standard drinks or equivalent per week    Comment: occasional, maybe once a week  . Drug use: No    Family History  Problem Relation Age of Onset  . Cancer Mother        bone, liver, lung former smoker ? lung died April 29, 2008  . Diabetes Mother   . Hypertension Mother   . Atrial fibrillation Sister     Review of Systems: A 12-system review of systems was performed and was negative except as noted in the HPI.  --------------------------------------------------------------------------------------------------  Physical Exam: BP (!) 148/84   Pulse (!) 105   Ht 5\' 9"  (1.753 m)   Wt 176 lb (79.8 kg)   BMI 25.99 kg/m   General:  NAD. HEENT: No conjunctival pallor or scleral icterus. Facemask in place. Neck: Supple without lymphadenopathy, thyromegaly, JVD, or HJR. No carotid bruit. Lungs: Normal work of breathing. Clear to auscultation bilaterally without wheezes or crackles. Heart: Tachycardic but regular without  murmurs, rubs, or gallops. Non-displaced PMI. Abd: Bowel sounds present. Soft, NT/ND without hepatosplenomegaly Ext: No lower extremity edema. Radial, PT, and DP pulses are 2+ bilaterally Skin: Warm and dry without rash. Neuro: CNIII-XII intact. Strength and fine-touch sensation intact in upper and lower extremities bilaterally. Psych: Normal mood and affect.  EKG:  Sinus tachycardia (HR 105 bpm).  Otherwise, no significant abnormality.  Heart rate has increased from prior tracing on 09/25/2015.  Lab Results  Component Value Date   WBC 6.0 12/11/2019   HGB 13.2 12/11/2019   HCT 39.4 12/11/2019   MCV 90.0 12/11/2019   PLT 253.0 12/11/2019    Lab Results  Component Value Date   NA 137 12/11/2019   K 4.5 12/11/2019   CL 104 12/11/2019   CO2 24 12/11/2019   BUN 17 12/11/2019   CREATININE 1.08 12/11/2019   GLUCOSE 121 (H) 12/11/2019   ALT 19 12/11/2019    Lab Results  Component Value Date   CHOL 109 12/11/2019   HDL 39.10 12/11/2019   LDLCALC 50 12/11/2019   TRIG 99.0 12/11/2019  CHOLHDL 3 12/11/2019    --------------------------------------------------------------------------------------------------  ASSESSMENT AND PLAN: Shortness of breath and aortic atherosclerosis: I suspect recent dyspnea and cough are primarily related to underlying pulmonary precess such as bronchitis or pneumonia.  Symptoms have improved with steroids, antibiotics, and bronchodilators.  Given multiple cardiac risk factors, including aortic atherosclerosis, diabetes mellitus, hypertension, and hyperlipidemia, I have recommended a pharmacologic myocardial perfusion stress test to assess for underlying coronary artery disease.  I recommend that [name] continue aspirin and statin therapy.  Sinus tachycardia: Mild and reportedly chronic.  Recent pulmonary infection and corticosteroids may be contributing.  No further workup at this time.  If Myoview shows evidence of cardiomegaly or reduced LVEF, we  will need to evaluate further with an echocardiogram.  Hyperlipidemia: LDL well controlled.  Continue atorvastatin 20 mg daily in the setting of DM and aortic atherosclerosis.  Hypertension: BP mildly elevated.  In the setting of acute illness, we will defer medication changes today.  Ongoing follow-up and management per Dr. Terese Door.  Follow-up: Return to clinic in 1 year, sooner if Myoview is abnormal or new symptoms develop.  Nelva Bush, MD 01/12/2020 1:24 PM

## 2020-01-12 ENCOUNTER — Encounter: Payer: Self-pay | Admitting: Internal Medicine

## 2020-01-12 DIAGNOSIS — R0602 Shortness of breath: Secondary | ICD-10-CM | POA: Insufficient documentation

## 2020-01-12 DIAGNOSIS — R000 Tachycardia, unspecified: Secondary | ICD-10-CM | POA: Insufficient documentation

## 2020-01-12 DIAGNOSIS — I700 Atherosclerosis of aorta: Secondary | ICD-10-CM | POA: Insufficient documentation

## 2020-01-12 DIAGNOSIS — R0609 Other forms of dyspnea: Secondary | ICD-10-CM | POA: Insufficient documentation

## 2020-01-13 NOTE — Telephone Encounter (Signed)
Noted Patient is reaching out to the [place] himself

## 2020-01-13 NOTE — Telephone Encounter (Signed)
Spoke with the Patient and he states to see the [place] office would $100 a visit as his insurance does not cover it.  Patient states he can not afford this and will reach out to the [place].  Patient wanting to know if Dr Olivia Mackie McLean-Scocuzza had to reach out to the [place] with a referral as well?

## 2020-01-13 NOTE — Telephone Encounter (Signed)
[facility]  I was told he may have establish PCP with [facility] to get referral to psychiatry  He can call [facility] in [place] and inquire but I am not associated with the [place] and they will not take a referral from me

## 2020-01-20 ENCOUNTER — Encounter: Payer: Self-pay | Admitting: Internal Medicine

## 2020-01-20 ENCOUNTER — Encounter
Admission: RE | Admit: 2020-01-20 | Discharge: 2020-01-20 | Disposition: A | Discharge status: Home / Self Care | Payer: Medicare Other | Source: Ambulatory Visit | Attending: Internal Medicine | Admitting: Internal Medicine

## 2020-01-20 ENCOUNTER — Other Ambulatory Visit: Payer: Self-pay

## 2020-01-20 DIAGNOSIS — E119 Type 2 diabetes mellitus without complications: Secondary | ICD-10-CM

## 2020-01-20 DIAGNOSIS — R0609 Other forms of dyspnea: Secondary | ICD-10-CM | POA: Insufficient documentation

## 2020-01-20 DIAGNOSIS — I700 Atherosclerosis of aorta: Secondary | ICD-10-CM

## 2020-01-20 LAB — NM MYOCAR MULTI W/SPECT W/WALL MOTION / EF
LV dias vol: 51 mL (ref 62–150)
LV sys vol: 13 mL
Peak HR: 118 {beats}/min
Percent HR: 77 %
Rest HR: 86 {beats}/min
SDS: 1
SRS: 1
SSS: 1
TID: 0.96

## 2020-01-20 MED ORDER — ASPIRIN EC 81 MG PO TBEC: 81.0000 mg | DELAYED_RELEASE_TABLET | Freq: Every day | ORAL | 3 refills | Status: DC

## 2020-01-20 MED ORDER — REGADENOSON 0.4 MG/5ML IV SOLN
0.4000 mg | Freq: Once | INTRAVENOUS | Status: AC
  Administered 2020-01-20: 10:00:00 0.4 mg via INTRAVENOUS

## 2020-01-20 MED ORDER — TECHNETIUM TC 99M TETROFOSMIN IV KIT
30.0000 | PACK | Freq: Once | INTRAVENOUS | Status: AC | PRN
  Administered 2020-01-20: 10:00:00 31.24 via INTRAVENOUS

## 2020-01-20 MED ORDER — TECHNETIUM TC 99M TETROFOSMIN IV KIT
10.0000 | PACK | Freq: Once | INTRAVENOUS | Status: AC | PRN
  Administered 2020-01-20: 08:00:00 10.28 via INTRAVENOUS

## 2020-01-22 ENCOUNTER — Other Ambulatory Visit: Payer: Self-pay

## 2020-01-22 DIAGNOSIS — J40 Bronchitis, not specified as acute or chronic: Secondary | ICD-10-CM

## 2020-01-22 MED ORDER — ALBUTEROL SULFATE HFA 108 (90 BASE) MCG/ACT IN AERS: 1.0000 | INHALATION_SPRAY | Freq: Four times a day (QID) | RESPIRATORY_TRACT | 0 refills | Status: DC | PRN

## 2020-02-05 ENCOUNTER — Other Ambulatory Visit: Payer: Self-pay

## 2020-02-05 ENCOUNTER — Ambulatory Visit (INDEPENDENT_AMBULATORY_CARE_PROVIDER_SITE_OTHER): Payer: Medicare Other

## 2020-02-05 DIAGNOSIS — I700 Atherosclerosis of aorta: Secondary | ICD-10-CM

## 2020-02-11 ENCOUNTER — Ambulatory Visit: Payer: Medicare Other | Admitting: Internal Medicine

## 2020-03-02 DIAGNOSIS — B351 Tinea unguium: Secondary | ICD-10-CM | POA: Diagnosis not present

## 2020-03-02 DIAGNOSIS — E1140 Type 2 diabetes mellitus with diabetic neuropathy, unspecified: Secondary | ICD-10-CM | POA: Diagnosis not present

## 2020-03-25 DIAGNOSIS — D2271 Melanocytic nevi of right lower limb, including hip: Secondary | ICD-10-CM | POA: Diagnosis not present

## 2020-03-25 DIAGNOSIS — D2261 Melanocytic nevi of right upper limb, including shoulder: Secondary | ICD-10-CM | POA: Diagnosis not present

## 2020-03-25 DIAGNOSIS — D225 Melanocytic nevi of trunk: Secondary | ICD-10-CM | POA: Diagnosis not present

## 2020-03-25 DIAGNOSIS — L538 Other specified erythematous conditions: Secondary | ICD-10-CM | POA: Diagnosis not present

## 2020-03-25 DIAGNOSIS — D2262 Melanocytic nevi of left upper limb, including shoulder: Secondary | ICD-10-CM | POA: Diagnosis not present

## 2020-03-25 DIAGNOSIS — D2272 Melanocytic nevi of left lower limb, including hip: Secondary | ICD-10-CM | POA: Diagnosis not present

## 2020-03-25 DIAGNOSIS — L820 Inflamed seborrheic keratosis: Secondary | ICD-10-CM | POA: Diagnosis not present

## 2020-03-25 DIAGNOSIS — L821 Other seborrheic keratosis: Secondary | ICD-10-CM | POA: Diagnosis not present

## 2020-04-01 ENCOUNTER — Ambulatory Visit (INDEPENDENT_AMBULATORY_CARE_PROVIDER_SITE_OTHER): Payer: Medicare Other

## 2020-04-01 ENCOUNTER — Ambulatory Visit: Payer: Medicare Other

## 2020-04-01 ENCOUNTER — Other Ambulatory Visit: Payer: Self-pay

## 2020-04-01 DIAGNOSIS — Z23 Encounter for immunization: Secondary | ICD-10-CM

## 2020-06-01 DIAGNOSIS — B351 Tinea unguium: Secondary | ICD-10-CM | POA: Diagnosis not present

## 2020-06-01 DIAGNOSIS — E1140 Type 2 diabetes mellitus with diabetic neuropathy, unspecified: Secondary | ICD-10-CM | POA: Diagnosis not present

## 2020-06-12 ENCOUNTER — Other Ambulatory Visit (INDEPENDENT_AMBULATORY_CARE_PROVIDER_SITE_OTHER): Payer: Medicare Other

## 2020-06-12 ENCOUNTER — Other Ambulatory Visit: Payer: Self-pay

## 2020-06-12 DIAGNOSIS — I152 Hypertension secondary to endocrine disorders: Secondary | ICD-10-CM

## 2020-06-12 DIAGNOSIS — E1159 Type 2 diabetes mellitus with other circulatory complications: Secondary | ICD-10-CM

## 2020-06-12 LAB — COMPREHENSIVE METABOLIC PANEL
ALT: 20 U/L (ref 0–53)
AST: 20 U/L (ref 0–37)
Albumin: 4.5 g/dL (ref 3.5–5.2)
Alkaline Phosphatase: 73 U/L (ref 39–117)
BUN: 23 mg/dL (ref 6–23)
CO2: 24 mEq/L (ref 19–32)
Calcium: 9.1 mg/dL (ref 8.4–10.5)
Chloride: 104 mEq/L (ref 96–112)
Creatinine, Ser: 1.29 mg/dL (ref 0.40–1.50)
GFR: 56.71 mL/min — ABNORMAL LOW (ref >60.00)
Glucose, Bld: 107 mg/dL — ABNORMAL HIGH (ref 70–99)
Potassium: 4.7 mEq/L (ref 3.5–5.1)
Sodium: 140 mEq/L (ref 135–145)
Total Bilirubin: 0.9 mg/dL (ref 0.2–1.2)
Total Protein: 7.1 g/dL (ref 6.0–8.3)

## 2020-06-12 LAB — CBC WITH DIFFERENTIAL/PLATELET
Basophils Absolute: 0 10*3/uL (ref 0.0–0.1)
Basophils Relative: 0.5 % (ref 0.0–3.0)
Eosinophils Absolute: 0.1 10*3/uL (ref 0.0–0.7)
Eosinophils Relative: 1.3 % (ref 0.0–5.0)
HCT: 37.4 % — ABNORMAL LOW (ref 39.0–52.0)
Hemoglobin: 13 g/dL (ref 13.0–17.0)
Lymphocytes Relative: 26.3 % (ref 12.0–46.0)
Lymphs Abs: 1.9 10*3/uL (ref 0.7–4.0)
MCHC: 34.7 g/dL (ref 30.0–36.0)
MCV: 88.6 fl (ref 78.0–100.0)
Monocytes Absolute: 0.8 10*3/uL (ref 0.1–1.0)
Monocytes Relative: 11 % (ref 3.0–12.0)
Neutro Abs: 4.4 10*3/uL (ref 1.4–7.7)
Neutrophils Relative %: 60.9 % (ref 43.0–77.0)
Platelets: 238 10*3/uL (ref 150.0–400.0)
RBC: 4.22 Mil/uL (ref 4.22–5.81)
RDW: 13.2 % (ref 11.5–15.5)
WBC: 7.3 10*3/uL (ref 4.0–10.5)

## 2020-06-12 LAB — LIPID PANEL
Cholesterol: 120 mg/dL (ref 0–200)
HDL: 37.2 mg/dL — ABNORMAL LOW (ref >39.00)
LDL Cholesterol: 58 mg/dL (ref 0–99)
NonHDL: 82.46
Total CHOL/HDL Ratio: 3
Triglycerides: 123 mg/dL (ref 0.0–149.0)
VLDL: 24.6 mg/dL (ref 0.0–40.0)

## 2020-06-12 LAB — HEMOGLOBIN A1C: Hgb A1c MFr Bld: 6.7 % — ABNORMAL HIGH (ref 4.6–6.5)

## 2020-06-13 LAB — MICROALBUMIN / CREATININE URINE RATIO
Creatinine, Urine: 76 mg/dL (ref 20–320)
Microalb Creat Ratio: 21 mcg/mg creat (ref <30)
Microalb, Ur: 1.6 mg/dL

## 2020-06-16 ENCOUNTER — Other Ambulatory Visit: Payer: Self-pay

## 2020-06-16 ENCOUNTER — Encounter: Payer: Self-pay | Admitting: Internal Medicine

## 2020-06-16 ENCOUNTER — Ambulatory Visit (INDEPENDENT_AMBULATORY_CARE_PROVIDER_SITE_OTHER): Payer: Medicare Other | Admitting: Internal Medicine

## 2020-06-16 VITALS — BP 114/70 | HR 82 | Temp 97.4°F | Ht 69.0 in | Wt 179.8 lb

## 2020-06-16 DIAGNOSIS — E1159 Type 2 diabetes mellitus with other circulatory complications: Secondary | ICD-10-CM

## 2020-06-16 DIAGNOSIS — J40 Bronchitis, not specified as acute or chronic: Secondary | ICD-10-CM | POA: Diagnosis not present

## 2020-06-16 DIAGNOSIS — F515 Nightmare disorder: Secondary | ICD-10-CM | POA: Diagnosis not present

## 2020-06-16 DIAGNOSIS — I10 Essential (primary) hypertension: Secondary | ICD-10-CM

## 2020-06-16 DIAGNOSIS — E785 Hyperlipidemia, unspecified: Secondary | ICD-10-CM | POA: Diagnosis not present

## 2020-06-16 DIAGNOSIS — F4310 Post-traumatic stress disorder, unspecified: Secondary | ICD-10-CM | POA: Diagnosis not present

## 2020-06-16 DIAGNOSIS — R9389 Abnormal findings on diagnostic imaging of other specified body structures: Secondary | ICD-10-CM

## 2020-06-16 DIAGNOSIS — J309 Allergic rhinitis, unspecified: Secondary | ICD-10-CM

## 2020-06-16 DIAGNOSIS — R937 Abnormal findings on diagnostic imaging of other parts of musculoskeletal system: Secondary | ICD-10-CM

## 2020-06-16 DIAGNOSIS — R0981 Nasal congestion: Secondary | ICD-10-CM

## 2020-06-16 DIAGNOSIS — E119 Type 2 diabetes mellitus without complications: Secondary | ICD-10-CM

## 2020-06-16 DIAGNOSIS — M5416 Radiculopathy, lumbar region: Secondary | ICD-10-CM | POA: Diagnosis not present

## 2020-06-16 DIAGNOSIS — I152 Hypertension secondary to endocrine disorders: Secondary | ICD-10-CM

## 2020-06-16 DIAGNOSIS — F419 Anxiety disorder, unspecified: Secondary | ICD-10-CM

## 2020-06-16 DIAGNOSIS — R918 Other nonspecific abnormal finding of lung field: Secondary | ICD-10-CM

## 2020-06-16 MED ORDER — METFORMIN HCL 1000 MG PO TABS: 1000.0000 mg | ORAL_TABLET | Freq: Two times a day (BID) | ORAL | 3 refills | Status: DC

## 2020-06-16 MED ORDER — SITAGLIPTIN PHOSPHATE 100 MG PO TABS
100.0000 mg | ORAL_TABLET | Freq: Every day | ORAL | 3 refills | Status: DC
Start: 2020-06-16 — End: 2020-12-10

## 2020-06-16 MED ORDER — CYCLOBENZAPRINE HCL 5 MG PO TABS: 5.0000 mg | ORAL_TABLET | Freq: Every evening | ORAL | 5 refills | Status: DC | PRN

## 2020-06-16 MED ORDER — ATORVASTATIN CALCIUM 20 MG PO TABS: 20.0000 mg | ORAL_TABLET | Freq: Every day | ORAL | 3 refills | Status: DC

## 2020-06-16 MED ORDER — FLUTICASONE PROPIONATE 50 MCG/ACT NA SUSP: 2.0000 | Freq: Every day | NASAL | 11 refills | Status: DC

## 2020-06-16 MED ORDER — CETIRIZINE HCL 10 MG PO TABS: 10.0000 mg | ORAL_TABLET | Freq: Every day | ORAL | 3 refills | Status: DC

## 2020-06-16 MED ORDER — AMLODIPINE BESYLATE 2.5 MG PO TABS: 2.5000 mg | ORAL_TABLET | Freq: Every day | ORAL | 3 refills | Status: DC

## 2020-06-16 NOTE — Patient Instructions (Addendum)
07/01/20 please check BMET 07/01/20 appointment thank you  See labs and vaccines   Results for Andrew Gonzalez, Andrew Gonzalez (MRN [id number]) as of 06/16/2020 10:00  Ref. Range 06/12/2020 08:11  Hemoglobin A1C Latest Ref Range: 4.6 - 6.5 % 6.7 (H)

## 2020-06-16 NOTE — Progress Notes (Addendum)
Chief Complaint  Patient presents with   Follow-up   F/u  1. HTN controlled on norvasc 2.5 mg qd DM 2 A1C 6.7 on Januvia 100 mg qd metformin 1000 mg bid  2. HLd on lipitor 20 mg qhs  3. Anxiety/ptsd got 40% disability and [facility] app[facility] 07/13/20 awaiting therapy via [place] 07/01/20 exam and will ask repeat BMET due to rise in Cr and decline in GFR avoid Ibuprofen for knee pain today    Review of Systems  Constitutional: Negative for weight loss.  HENT: Negative for hearing loss.   Eyes: Negative for blurred vision.  Respiratory: Negative for shortness of breath.   Cardiovascular: Negative for chest pain.  Gastrointestinal: Negative for abdominal pain and blood in stool.  Musculoskeletal: Positive for joint pain. Negative for falls.  Skin: Negative for rash.  Neurological: Negative for headaches.  Psychiatric/Behavioral: The patient is nervous/anxious and has insomnia.    Past Medical History:  Diagnosis Date   Allergy    Chicken pox    Colon polyps    COVID-19    11/06/19 did not have mab infusion   Diabetes mellitus without complication ([place])    Hyperlipidemia    Hypertension    Past Surgical History:  Procedure Laterality Date   TONSILLECTOMY     Family History  Problem Relation Age of Onset   Cancer Mother        bone, liver, lung former smoker ? lung died 2008/05/30   Diabetes Mother    Hypertension Mother    Atrial fibrillation Sister    Social History   Socioeconomic History   Marital status: Married    Spouse name: Not on file   Number of children: Not on file   Years of education: Not on file   Highest education level: Not on file  Occupational History   Not on file  Tobacco Use   Smoking status: Former Smoker    Packs/day: 0.50    Years: 20.00    Pack years: 10.00    Types: Cigarettes    Quit date: 05/30/2005    Years since quitting: 15.3   Smokeless tobacco: Never Used  Substance and Sexual Activity   Alcohol use: Yes    Alcohol/week:  1.0 standard drink    Types: 1 Standard drinks or equivalent per week    Comment: occasional, maybe once a week   Drug use: No   Sexual activity: Yes    Partners: Female  Other Topics Concern   Not on file  Social History Narrative   Wears selt belt    Safe in relationship    Recently moved from [facility] 2018/19 originally from [facility] dad and sister 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    Retired Occupational hygienist degree    Social Determinants of Radio broadcast assistant Strain: Low Risk    Difficulty of Paying Living Expenses: Not hard at all  Food Insecurity: No Food Insecurity   Worried About Charity fundraiser in the Last Year: Never true   Arboriculturist in the Last Year: Never true  Transportation Needs: No Transportation Needs   Lack of Transportation (Medical): No   Lack of Transportation (Non-Medical): No  Physical Activity: Insufficiently Active   Days of Exercise per Week: 3 days   Minutes of Exercise per Session: 30 min  Stress: No Stress Concern Present   Feeling of Stress : Not at all  Social Connections: Unknown  Frequency of Communication with Friends and Family: Not on file   Frequency of Social Gatherings with Friends and Family: Not on file   Attends Religious Services: Not on file   Active Member of Clubs or Organizations: Not on file   Attends Archivist Meetings: Not on file   Marital Status: Married  Human resources officer Violence: Not At Risk   Fear of Current or Ex-Partner: No   Emotionally Abused: No   Physically Abused: No   Sexually Abused: No   Current Meds  Medication Sig   albuterol (VENTOLIN HFA) 108 (90 Base) MCG/ACT inhaler Inhale 1-2 puffs into the lungs every 6 (six) hours as needed for wheezing or shortness of breath.   amLODipine (NORVASC) 2.5 MG tablet Take 1 tablet (2.5 mg total) by mouth daily.   atorvastatin (LIPITOR) 20 MG tablet Take 1 tablet (20 mg total) by mouth daily at 6 PM.   cetirizine (ZYRTEC) 10 MG tablet Take 1  tablet (10 mg total) by mouth daily.   cholecalciferol (VITAMIN D3) 25 MCG (1000 UNIT) tablet Take 1,000 Units by mouth daily.   Cinnamon 500 MG TABS Take by mouth.   cyclobenzaprine (FLEXERIL) 5 MG tablet Take 1-2 tablets (5-10 mg total) by mouth at bedtime as needed for muscle spasms.   fluticasone (FLONASE) 50 MCG/ACT nasal spray Place 2 sprays into both nostrils daily.   glucose blood (FREESTYLE LITE) test strip Use as instructed qd to bid E11.9   Lancets (FREESTYLE) lancets Qd to bid Use as instructed E11.9   metFORMIN (GLUCOPHAGE) 1000 MG tablet Take 1 tablet (1,000 mg total) by mouth 2 (two) times daily with a meal.   Multiple Vitamin (MULTIVITAMIN) tablet Take 1 tablet by mouth daily.   sitaGLIPtin (JANUVIA) 100 MG tablet Take 1 tablet (100 mg total) by mouth daily.   sodium chloride (OCEAN) 0.65 % SOLN nasal spray Place 1 spray into both nostrils as needed for congestion.   [DISCONTINUED] ibuprofen (ADVIL) 200 MG tablet Take 1-2 tablets (200-400 mg total) by mouth every 8 (eight) hours as needed.   Allergies  Allergen Reactions   Bactrim [Sulfamethoxazole-Trimethoprim]     Red face    Biofreeze [Menthol (Topical Analgesic)]     Rash    Recent Results (from the past 2160 hour(s))  CBC with Differential/Platelet     Status: Abnormal   Collection Time: 06/12/20  8:11 AM  Result Value Ref Range   WBC 7.3 4.0 - 10.5 K/uL   RBC 4.22 4.22 - 5.81 Mil/uL   Hemoglobin 13.0 13.0 - 17.0 g/dL   HCT 37.4 (L) 39.0 - 52.0 %   MCV 88.6 78.0 - 100.0 fl   MCHC 34.7 30.0 - 36.0 g/dL   RDW 13.2 11.5 - 15.5 %   Platelets 238.0 150.0 - 400.0 K/uL   Neutrophils Relative % 60.9 43.0 - 77.0 %   Lymphocytes Relative 26.3 12.0 - 46.0 %   Monocytes Relative 11.0 3.0 - 12.0 %   Eosinophils Relative 1.3 0.0 - 5.0 %   Basophils Relative 0.5 0.0 - 3.0 %   Neutro Abs 4.4 1.4 - 7.7 K/uL   Lymphs Abs 1.9 0.7 - 4.0 K/uL   Monocytes Absolute 0.8 0.1 - 1.0 K/uL   Eosinophils Absolute 0.1 0.0 - 0.7 K/uL    Basophils Absolute 0.0 0.0 - 0.1 K/uL  Hemoglobin A1c     Status: Abnormal   Collection Time: 06/12/20  8:11 AM  Result Value Ref Range   Hgb A1c MFr  Bld 6.7 (H) 4.6 - 6.5 %    Comment: Glycemic Control Guidelines for People with Diabetes:Non Diabetic:  <6%Goal of Therapy: <7%Additional Action Suggested:  >8%   Lipid panel     Status: Abnormal   Collection Time: 06/12/20  8:11 AM  Result Value Ref Range   Cholesterol 120 0 - 200 mg/dL    Comment: ATP III Classification       Desirable:  < 200 mg/dL               Borderline High:  200 - 239 mg/dL          High:  > = 240 mg/dL   Triglycerides 123.0 0.0 - 149.0 mg/dL    Comment: Normal:  <150 mg/dLBorderline High:  150 - 199 mg/dL   HDL 37.20 (L) >39.00 mg/dL   VLDL 24.6 0.0 - 40.0 mg/dL   LDL Cholesterol 58 0 - 99 mg/dL   Total CHOL/HDL Ratio 3     Comment:                Men          Women1/2 Average Risk     3.4          3.3Average Risk          5.0          4.42X Average Risk          9.6          7.13X Average Risk          15.0          11.0                       NonHDL 82.46     Comment: NOTE:  Non-HDL goal should be 30 mg/dL higher than patient's LDL goal (i.e. LDL goal of < 70 mg/dL, would have non-HDL goal of < 100 mg/dL)  Comprehensive metabolic panel     Status: Abnormal   Collection Time: 06/12/20  8:11 AM  Result Value Ref Range   Sodium 140 135 - 145 mEq/L   Potassium 4.7 3.5 - 5.1 mEq/L   Chloride 104 96 - 112 mEq/L   CO2 24 19 - 32 mEq/L   Glucose, Bld 107 (H) 70 - 99 mg/dL   BUN 23 6 - 23 mg/dL   Creatinine, Ser 1.29 0.40 - 1.50 mg/dL   Total Bilirubin 0.9 0.2 - 1.2 mg/dL   Alkaline Phosphatase 73 39 - 117 U/L   AST 20 0 - 37 U/L   ALT 20 0 - 53 U/L   Total Protein 7.1 6.0 - 8.3 g/dL   Albumin 4.5 3.5 - 5.2 g/dL   GFR 56.71 (L) >60.00 mL/min    Comment: Calculated using the CKD-EPI Creatinine Equation (2021)   Calcium 9.1 8.4 - 10.5 mg/dL  Microalbumin / creatinine urine ratio     Status: None   Collection  Time: 06/12/20  8:11 AM  Result Value Ref Range   Creatinine, Urine 76 20 - 320 mg/dL   Microalb, Ur 1.6 mg/dL    Comment: Reference Range Not established    Microalb Creat Ratio 21 <30 mcg/mg creat    Comment: . The ADA defines abnormalities in albumin excretion as follows: Marland Kitchen Albuminuria Category        Result (mcg/mg creatinine) . Normal to Mildly increased   <30 Moderately increased         30-299  Severely increased           > OR = 300 . The ADA recommends that at least two of three specimens collected within a 3-6 month period be abnormal before considering a patient to be within a diagnostic category.    Objective  Body mass index is 26.55 kg/m. Wt Readings from Last 3 Encounters:  06/16/20 179 lb 12.8 oz (81.6 kg)  01/10/20 176 lb (79.8 kg)  12/27/19 174 lb (78.9 kg)   Temp Readings from Last 3 Encounters:  06/16/20 (!) 97.4 F (36.3 C) (Oral)  12/18/19 (!) 97.4 F (36.3 C) (Oral)  11/08/19 99 F (37.2 C) (Oral)   BP Readings from Last 3 Encounters:  06/16/20 114/70  01/10/20 (!) 148/84  12/27/19 140/81   Pulse Readings from Last 3 Encounters:  06/16/20 82  01/10/20 (!) 105  12/27/19 (!) 107    Physical Exam Vitals and nursing note reviewed.  Constitutional:      Appearance: Normal appearance. He is well-developed and well-groomed.  HENT:     Head: Normocephalic and atraumatic.  Eyes:     Conjunctiva/sclera: Conjunctivae normal.     Pupils: Pupils are equal, round, and reactive to light.  Cardiovascular:     Rate and Rhythm: Normal rate and regular rhythm.     Heart sounds: Normal heart sounds. No murmur heard.   Pulmonary:     Effort: Pulmonary effort is normal.     Breath sounds: Normal breath sounds.  Abdominal:     Tenderness: There is no abdominal tenderness.  Skin:    General: Skin is warm and dry.  Neurological:     General: No focal deficit present.     Mental Status: He is alert and oriented to person, place, and time. Mental  status is at baseline.     Gait: Gait normal.  Psychiatric:        Attention and Perception: Attention and perception normal.        Mood and Affect: Mood and affect normal.        Speech: Speech normal.        Behavior: Behavior normal. Behavior is cooperative.        Thought Content: Thought content normal.        Cognition and Memory: Cognition and memory normal.        Judgment: Judgment normal.     Assessment  Plan  Hypertension associated with diabetes Hyperlipidemia, unspecified hyperlipidemia type All above controlled  norvasc 2.5 mg qd on Januvia 100 mg qd metformin 1000 mg bid  on lipitor 20 mg qhs  Eye exam due 07/2020 AE  Will have [facility] 07/01/20   anxiety PTSD (post-traumatic stress disorder) Nightmares  F/u [facility] psych and therapy appt pending ad os 07/2020   [facility] Labs 06/2020 reviewed A1C 6.7 Flu shot utd Tdap utd  prevnar utd pna 23 vaccine covid vx 2/2 covid 19 + 11/06/19 declines booster had 2/2 shingrix vaccines     DRE in future PSA 12/11/19 2.53    colonoscopy had 11/23/15 moderate to severe diverticulosis 2 polyps no pathology report noted Dr. Rexene Alberts [facility] requested records for pathology report  -rec repeat in 5 years with h/o polyps ? Need pathology per pt told 10 years  No FH colon cancer   Hep C neg    dermatology saw Dr. Kellie Moor tbse multiple nevi due to see again 02/15/2018 saw cyst removal back 03/07/2018 sch currently 02/2020 will see if  can seen sooner  02/2019 nl tbse  02/13/20 dermatology ln2 check resolved    Eye exam AE 08/01/19 f/u 1 year   rec healthy diet and exercise    08/15/20 reviewed records [facility]  08/05/20 hcv negative tsh 1.670, hiv neg, urine protein neg  06/2020 A1C 6.7 % Mental health consult ordered  depression/anxiety/ptsd/insomnia 5 hrs sleep at night, nightmares/terrors [place] [employer] [phone] 14 friends died nightmares since 05/11/2013  -->Rx zoloft 25 mg qd then increase to 50 mg in 2  weeks  07/13/20 [facility]  Thyroid normal, scattered mild CAD, scattered pulm nodules largest left lower lobe 8 mm additional scattered b/l sub 6 mm  rec 3 month CT chest  We also referred to [facility] Har[facility]   CXr 07/01/20 negative   Provider: Dr. Olivia Mackie McLean-Scocuzza-Internal Medicine

## 2020-07-03 ENCOUNTER — Encounter: Payer: Self-pay | Admitting: Internal Medicine

## 2020-07-08 ENCOUNTER — Encounter: Payer: Self-pay | Admitting: Internal Medicine

## 2020-07-08 ENCOUNTER — Telehealth: Payer: Self-pay

## 2020-07-08 DIAGNOSIS — K5790 Diverticulosis of intestine, part unspecified, without perforation or abscess without bleeding: Secondary | ICD-10-CM | POA: Insufficient documentation

## 2020-07-08 DIAGNOSIS — D126 Benign neoplasm of colon, unspecified: Secondary | ICD-10-CM | POA: Insufficient documentation

## 2020-07-08 NOTE — Telephone Encounter (Signed)
Faxed medical records request to [facility]. Sent to [phone] on 07/07/20. Requesting colonoscopy from 11/2015 and pathology.

## 2020-07-20 ENCOUNTER — Encounter: Payer: Self-pay | Admitting: Internal Medicine

## 2020-07-21 NOTE — Telephone Encounter (Signed)
Please advise, Patient inquiring about opening a new line of communication between our office and the [place]

## 2020-07-26 ENCOUNTER — Encounter: Payer: Self-pay | Admitting: Internal Medicine

## 2020-07-27 ENCOUNTER — Encounter: Payer: Self-pay | Admitting: Internal Medicine

## 2020-07-27 DIAGNOSIS — R918 Other nonspecific abnormal finding of lung field: Secondary | ICD-10-CM | POA: Insufficient documentation

## 2020-07-27 NOTE — Telephone Encounter (Signed)
Please advise, should this referral wait for Dr Olivia Mackie McLean-Scocuzza to get back in to office?

## 2020-07-27 NOTE — Addendum Note (Signed)
Addended by: Orland Mustard on: 07/27/2020 10:01 AM   Modules accepted: Orders

## 2020-08-13 DIAGNOSIS — E119 Type 2 diabetes mellitus without complications: Secondary | ICD-10-CM | POA: Diagnosis not present

## 2020-08-13 LAB — HM DIABETES EYE EXAM

## 2020-08-14 ENCOUNTER — Encounter: Payer: Self-pay | Admitting: Internal Medicine

## 2020-08-17 DIAGNOSIS — R918 Other nonspecific abnormal finding of lung field: Secondary | ICD-10-CM | POA: Diagnosis not present

## 2020-08-17 DIAGNOSIS — K449 Diaphragmatic hernia without obstruction or gangrene: Secondary | ICD-10-CM | POA: Diagnosis not present

## 2020-08-17 DIAGNOSIS — I2584 Coronary atherosclerosis due to calcified coronary lesion: Secondary | ICD-10-CM | POA: Diagnosis not present

## 2020-08-31 ENCOUNTER — Telehealth: Payer: Self-pay

## 2020-08-31 DIAGNOSIS — B351 Tinea unguium: Secondary | ICD-10-CM | POA: Diagnosis not present

## 2020-08-31 DIAGNOSIS — E1140 Type 2 diabetes mellitus with diabetic neuropathy, unspecified: Secondary | ICD-10-CM | POA: Diagnosis not present

## 2020-08-31 NOTE — Telephone Encounter (Signed)
Pt came in and dropped off medical records from the [place]. Placed in folder up front.

## 2020-09-01 NOTE — Telephone Encounter (Signed)
Placed on your desk. 

## 2020-09-06 DIAGNOSIS — R9389 Abnormal findings on diagnostic imaging of other specified body structures: Secondary | ICD-10-CM | POA: Insufficient documentation

## 2020-09-07 ENCOUNTER — Telehealth: Payer: Self-pay | Admitting: Internal Medicine

## 2020-09-07 NOTE — Telephone Encounter (Signed)
Received patient records from the [place]. Copy has been sent to scan. Will call Patient to come and pick up a copy for his records.

## 2020-09-07 NOTE — Telephone Encounter (Signed)
Patient states that the records dropped off was the office copy. States he has a copy at home and the paper copy in office can be shredded after it is scanned. Sent to scan

## 2020-09-08 DIAGNOSIS — R911 Solitary pulmonary nodule: Secondary | ICD-10-CM | POA: Diagnosis not present

## 2020-11-09 ENCOUNTER — Encounter: Payer: Self-pay | Admitting: Internal Medicine

## 2020-11-29 ENCOUNTER — Encounter: Payer: Self-pay | Admitting: Internal Medicine

## 2020-11-30 NOTE — Telephone Encounter (Signed)
For your information  

## 2020-12-01 DIAGNOSIS — B351 Tinea unguium: Secondary | ICD-10-CM | POA: Diagnosis not present

## 2020-12-01 DIAGNOSIS — E1140 Type 2 diabetes mellitus with diabetic neuropathy, unspecified: Secondary | ICD-10-CM | POA: Diagnosis not present

## 2020-12-02 ENCOUNTER — Other Ambulatory Visit: Payer: Self-pay | Admitting: Internal Medicine

## 2020-12-02 ENCOUNTER — Encounter: Payer: Self-pay | Admitting: Internal Medicine

## 2020-12-02 DIAGNOSIS — K449 Diaphragmatic hernia without obstruction or gangrene: Secondary | ICD-10-CM | POA: Insufficient documentation

## 2020-12-02 DIAGNOSIS — I2510 Atherosclerotic heart disease of native coronary artery without angina pectoris: Secondary | ICD-10-CM | POA: Insufficient documentation

## 2020-12-10 ENCOUNTER — Encounter: Payer: Self-pay | Admitting: Internal Medicine

## 2020-12-10 DIAGNOSIS — E119 Type 2 diabetes mellitus without complications: Secondary | ICD-10-CM

## 2020-12-10 MED ORDER — SITAGLIPTIN PHOSPHATE 100 MG PO TABS: 100.0000 mg | ORAL_TABLET | Freq: Every day | ORAL | 3 refills | Status: DC

## 2020-12-11 ENCOUNTER — Telehealth: Payer: Self-pay | Admitting: Internal Medicine

## 2020-12-11 NOTE — Telephone Encounter (Signed)
Patient has lab appt 12/14/2020, there are no orders in.

## 2020-12-14 ENCOUNTER — Ambulatory Visit: Payer: Medicare Other

## 2020-12-14 ENCOUNTER — Other Ambulatory Visit: Payer: Self-pay | Admitting: Internal Medicine

## 2020-12-14 ENCOUNTER — Other Ambulatory Visit: Payer: Medicare Other

## 2020-12-14 DIAGNOSIS — Z1389 Encounter for screening for other disorder: Secondary | ICD-10-CM

## 2020-12-14 DIAGNOSIS — E559 Vitamin D deficiency, unspecified: Secondary | ICD-10-CM

## 2020-12-14 DIAGNOSIS — I152 Hypertension secondary to endocrine disorders: Secondary | ICD-10-CM

## 2020-12-14 DIAGNOSIS — Z125 Encounter for screening for malignant neoplasm of prostate: Secondary | ICD-10-CM

## 2020-12-14 DIAGNOSIS — N400 Enlarged prostate without lower urinary tract symptoms: Secondary | ICD-10-CM

## 2020-12-15 ENCOUNTER — Other Ambulatory Visit: Payer: Self-pay | Admitting: Internal Medicine

## 2020-12-15 ENCOUNTER — Encounter: Payer: Self-pay | Admitting: Internal Medicine

## 2020-12-15 DIAGNOSIS — E119 Type 2 diabetes mellitus without complications: Secondary | ICD-10-CM

## 2020-12-15 MED ORDER — SITAGLIPTIN PHOSPHATE 100 MG PO TABS: 100.0000 mg | ORAL_TABLET | Freq: Every day | ORAL | 3 refills | Status: DC

## 2020-12-16 ENCOUNTER — Telehealth: Payer: Self-pay | Admitting: Internal Medicine

## 2020-12-16 ENCOUNTER — Other Ambulatory Visit: Payer: Self-pay

## 2020-12-16 ENCOUNTER — Other Ambulatory Visit (INDEPENDENT_AMBULATORY_CARE_PROVIDER_SITE_OTHER): Payer: Medicare Other

## 2020-12-16 DIAGNOSIS — I152 Hypertension secondary to endocrine disorders: Secondary | ICD-10-CM

## 2020-12-16 DIAGNOSIS — E559 Vitamin D deficiency, unspecified: Secondary | ICD-10-CM | POA: Diagnosis not present

## 2020-12-16 DIAGNOSIS — Z125 Encounter for screening for malignant neoplasm of prostate: Secondary | ICD-10-CM | POA: Diagnosis not present

## 2020-12-16 DIAGNOSIS — N400 Enlarged prostate without lower urinary tract symptoms: Secondary | ICD-10-CM | POA: Diagnosis not present

## 2020-12-16 DIAGNOSIS — E1159 Type 2 diabetes mellitus with other circulatory complications: Secondary | ICD-10-CM | POA: Diagnosis not present

## 2020-12-16 DIAGNOSIS — Z1389 Encounter for screening for other disorder: Secondary | ICD-10-CM | POA: Diagnosis not present

## 2020-12-16 LAB — CBC WITH DIFFERENTIAL/PLATELET
Basophils Absolute: 0 10*3/uL (ref 0.0–0.1)
Basophils Relative: 0.5 % (ref 0.0–3.0)
Eosinophils Absolute: 0.1 10*3/uL (ref 0.0–0.7)
Eosinophils Relative: 0.7 % (ref 0.0–5.0)
HCT: 36.6 % — ABNORMAL LOW (ref 39.0–52.0)
Hemoglobin: 12.4 g/dL — ABNORMAL LOW (ref 13.0–17.0)
Lymphocytes Relative: 23.9 % (ref 12.0–46.0)
Lymphs Abs: 2 10*3/uL (ref 0.7–4.0)
MCHC: 33.9 g/dL (ref 30.0–36.0)
MCV: 89.8 fl (ref 78.0–100.0)
Monocytes Absolute: 0.6 10*3/uL (ref 0.1–1.0)
Monocytes Relative: 7.7 % (ref 3.0–12.0)
Neutro Abs: 5.5 10*3/uL (ref 1.4–7.7)
Neutrophils Relative %: 67.2 % (ref 43.0–77.0)
Platelets: 237 10*3/uL (ref 150.0–400.0)
RBC: 4.07 Mil/uL — ABNORMAL LOW (ref 4.22–5.81)
RDW: 13.1 % (ref 11.5–15.5)
WBC: 8.2 10*3/uL (ref 4.0–10.5)

## 2020-12-16 LAB — COMPREHENSIVE METABOLIC PANEL
ALT: 16 U/L (ref 0–53)
AST: 19 U/L (ref 0–37)
Albumin: 4.5 g/dL (ref 3.5–5.2)
Alkaline Phosphatase: 71 U/L (ref 39–117)
BUN: 24 mg/dL — ABNORMAL HIGH (ref 6–23)
CO2: 25 mEq/L (ref 19–32)
Calcium: 9.2 mg/dL (ref 8.4–10.5)
Chloride: 101 mEq/L (ref 96–112)
Creatinine, Ser: 1.48 mg/dL (ref 0.40–1.50)
GFR: 47.92 mL/min — ABNORMAL LOW (ref >60.00)
Glucose, Bld: 121 mg/dL — ABNORMAL HIGH (ref 70–99)
Potassium: 5 mEq/L (ref 3.5–5.1)
Sodium: 137 mEq/L (ref 135–145)
Total Bilirubin: 0.6 mg/dL (ref 0.2–1.2)
Total Protein: 6.9 g/dL (ref 6.0–8.3)

## 2020-12-16 LAB — LIPID PANEL
Cholesterol: 129 mg/dL (ref 0–200)
HDL: 40.9 mg/dL (ref >39.00)
LDL Cholesterol: 69 mg/dL (ref 0–99)
NonHDL: 87.91
Total CHOL/HDL Ratio: 3
Triglycerides: 97 mg/dL (ref 0.0–149.0)
VLDL: 19.4 mg/dL (ref 0.0–40.0)

## 2020-12-16 LAB — VITAMIN D 25 HYDROXY (VIT D DEFICIENCY, FRACTURES): VITD: 48.77 ng/mL (ref 30.00–100.00)

## 2020-12-16 LAB — HEMOGLOBIN A1C: Hgb A1c MFr Bld: 7.2 % — ABNORMAL HIGH (ref 4.6–6.5)

## 2020-12-16 LAB — PSA: PSA: 2.91 ng/mL (ref 0.10–4.00)

## 2020-12-16 NOTE — Telephone Encounter (Signed)
Pt Wife Andrew Minnie) called in requesting to speak with Andrew Glad [employer]). Advise Pt wife that Andrew Glad was busy with Pt this morning. Pt Wife stated that she didn't care, that she wanted to speak with Andrew Glad. Advise Andrew Glad that Pt Wife Andrew Frick) would like to speak with her. Andrew Glad reply back stating that she wasn't taking the call. I advise Pt Wife that Andrew Glad is busy with Pt and what can I help her with in a timely fashion. Pt Wife stated that this is a medical office, that there is nothing fashionable.  Advise Pt wife again what can I help her with this morning. Andrew Gonzalez stated that she is upset that her and her husband appt are getting missed up. Andrew Gonzalez stated that someone schedule her husband for 11am fasting lab appt were he should have had an early morning appt cause 11am is to long for him to fast. I advise Andrew Gonzalez that we asked her husband what day and time would be best for him to do his lab. Pt advise [place] that he would like to do his lab before he see Andrew Gonzalez for next appointment. Advise Pt of first time and date. Pt stated ok , so we schedule pt for 12/16/2020 for 11am. Asked Andrew Gonzalez would she like to cancel the appointment and reschedule. Andrew Gonzalez stated no that he is already fasting that he will be at the 11am appt that he wasn't going to do it again. Then Pt wife stated that the conversation was over and she hung up the phone.

## 2020-12-17 ENCOUNTER — Encounter: Payer: Self-pay | Admitting: Internal Medicine

## 2020-12-17 ENCOUNTER — Other Ambulatory Visit: Payer: Self-pay

## 2020-12-17 ENCOUNTER — Ambulatory Visit (INDEPENDENT_AMBULATORY_CARE_PROVIDER_SITE_OTHER): Payer: Medicare Other

## 2020-12-17 ENCOUNTER — Ambulatory Visit (INDEPENDENT_AMBULATORY_CARE_PROVIDER_SITE_OTHER): Payer: Medicare Other | Admitting: Internal Medicine

## 2020-12-17 VITALS — BP 130/64 | HR 92 | Temp 97.2°F | Ht 69.0 in | Wt 174.6 lb

## 2020-12-17 DIAGNOSIS — M79602 Pain in left arm: Secondary | ICD-10-CM

## 2020-12-17 DIAGNOSIS — R936 Abnormal findings on diagnostic imaging of limbs: Secondary | ICD-10-CM

## 2020-12-17 DIAGNOSIS — M2550 Pain in unspecified joint: Secondary | ICD-10-CM

## 2020-12-17 DIAGNOSIS — R7989 Other specified abnormal findings of blood chemistry: Secondary | ICD-10-CM | POA: Diagnosis not present

## 2020-12-17 DIAGNOSIS — K639 Disease of intestine, unspecified: Secondary | ICD-10-CM

## 2020-12-17 DIAGNOSIS — M25511 Pain in right shoulder: Secondary | ICD-10-CM

## 2020-12-17 DIAGNOSIS — M899 Disorder of bone, unspecified: Secondary | ICD-10-CM

## 2020-12-17 DIAGNOSIS — I152 Hypertension secondary to endocrine disorders: Secondary | ICD-10-CM

## 2020-12-17 DIAGNOSIS — C799 Secondary malignant neoplasm of unspecified site: Secondary | ICD-10-CM

## 2020-12-17 DIAGNOSIS — M19012 Primary osteoarthritis, left shoulder: Secondary | ICD-10-CM | POA: Diagnosis not present

## 2020-12-17 DIAGNOSIS — M79601 Pain in right arm: Secondary | ICD-10-CM | POA: Diagnosis not present

## 2020-12-17 DIAGNOSIS — D649 Anemia, unspecified: Secondary | ICD-10-CM | POA: Diagnosis not present

## 2020-12-17 DIAGNOSIS — E1159 Type 2 diabetes mellitus with other circulatory complications: Secondary | ICD-10-CM

## 2020-12-17 DIAGNOSIS — Z23 Encounter for immunization: Secondary | ICD-10-CM

## 2020-12-17 DIAGNOSIS — M25512 Pain in left shoulder: Secondary | ICD-10-CM | POA: Diagnosis not present

## 2020-12-17 DIAGNOSIS — M898X9 Other specified disorders of bone, unspecified site: Secondary | ICD-10-CM | POA: Diagnosis not present

## 2020-12-17 DIAGNOSIS — D126 Benign neoplasm of colon, unspecified: Secondary | ICD-10-CM

## 2020-12-17 DIAGNOSIS — R634 Abnormal weight loss: Secondary | ICD-10-CM

## 2020-12-17 DIAGNOSIS — Z0000 Encounter for general adult medical examination without abnormal findings: Secondary | ICD-10-CM

## 2020-12-17 DIAGNOSIS — M19011 Primary osteoarthritis, right shoulder: Secondary | ICD-10-CM | POA: Diagnosis not present

## 2020-12-17 DIAGNOSIS — K5730 Diverticulosis of large intestine without perforation or abscess without bleeding: Secondary | ICD-10-CM

## 2020-12-17 LAB — URINALYSIS, ROUTINE W REFLEX MICROSCOPIC
Bilirubin Urine: NEGATIVE
Glucose, UA: NEGATIVE
Hgb urine dipstick: NEGATIVE
Ketones, ur: NEGATIVE
Leukocytes,Ua: NEGATIVE
Nitrite: NEGATIVE
Protein, ur: NEGATIVE
Specific Gravity, Urine: 1.015 (ref 1.001–1.035)
pH: 5.5 (ref 5.0–8.0)
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
## 2020-12-17 NOTE — Patient Instructions (Addendum)
Find out which drug they have similar to [place]?  Stop metformin for now  Let me know about diabetic meds which one of cousins preferred Januvia

## 2020-12-18 ENCOUNTER — Encounter: Payer: Self-pay | Admitting: Internal Medicine

## 2020-12-21 NOTE — Addendum Note (Signed)
Addended by: Orland Mustard on: 12/21/2020 10:11 AM   Modules accepted: Orders

## 2020-12-28 ENCOUNTER — Other Ambulatory Visit: Payer: Medicare Other

## 2020-12-28 ENCOUNTER — Ambulatory Visit: Payer: Medicare Other

## 2021-01-04 ENCOUNTER — Ambulatory Visit
Admission: RE | Admit: 2021-01-04 | Discharge: 2021-01-04 | Disposition: A | Discharge status: Home / Self Care | Payer: Medicare Other | Source: Ambulatory Visit | Attending: Internal Medicine | Admitting: Internal Medicine

## 2021-01-04 ENCOUNTER — Encounter
Admission: RE | Admit: 2021-01-04 | Discharge: 2021-01-04 | Disposition: A | Discharge status: Home / Self Care | Payer: Medicare Other | Source: Ambulatory Visit | Attending: Internal Medicine | Admitting: Internal Medicine

## 2021-01-04 ENCOUNTER — Other Ambulatory Visit: Payer: Self-pay

## 2021-01-04 DIAGNOSIS — C419 Malignant neoplasm of bone and articular cartilage, unspecified: Secondary | ICD-10-CM | POA: Diagnosis not present

## 2021-01-04 DIAGNOSIS — M899 Disorder of bone, unspecified: Secondary | ICD-10-CM | POA: Diagnosis not present

## 2021-01-04 MED ORDER — TECHNETIUM TC 99M MEDRONATE IV KIT
20.0000 | PACK | Freq: Once | INTRAVENOUS | Status: AC | PRN
  Administered 2021-01-04: 12:00:00 21.9 via INTRAVENOUS

## 2021-01-06 ENCOUNTER — Encounter: Payer: Self-pay | Admitting: Internal Medicine

## 2021-01-06 ENCOUNTER — Other Ambulatory Visit (INDEPENDENT_AMBULATORY_CARE_PROVIDER_SITE_OTHER): Payer: Medicare Other

## 2021-01-06 ENCOUNTER — Other Ambulatory Visit: Payer: Self-pay

## 2021-01-06 DIAGNOSIS — R7989 Other specified abnormal findings of blood chemistry: Secondary | ICD-10-CM | POA: Diagnosis not present

## 2021-01-06 DIAGNOSIS — M2550 Pain in unspecified joint: Secondary | ICD-10-CM | POA: Diagnosis not present

## 2021-01-06 DIAGNOSIS — D649 Anemia, unspecified: Secondary | ICD-10-CM

## 2021-01-06 LAB — BASIC METABOLIC PANEL
BUN: 26 mg/dL — ABNORMAL HIGH (ref 6–23)
CO2: 24 mEq/L (ref 19–32)
Calcium: 9.2 mg/dL (ref 8.4–10.5)
Chloride: 102 mEq/L (ref 96–112)
Creatinine, Ser: 1.53 mg/dL — ABNORMAL HIGH (ref 0.40–1.50)
GFR: 46.03 mL/min — ABNORMAL LOW (ref >60.00)
Glucose, Bld: 189 mg/dL — ABNORMAL HIGH (ref 70–99)
Potassium: 4.4 mEq/L (ref 3.5–5.1)
Sodium: 135 mEq/L (ref 135–145)

## 2021-01-06 LAB — C-REACTIVE PROTEIN: CRP: <1 mg/dL (ref 0.5–20.0)

## 2021-01-06 LAB — MICROALBUMIN / CREATININE URINE RATIO
Creatinine,U: 57.1 mg/dL
Microalb Creat Ratio: 2.7 mg/g (ref 0.0–30.0)
Microalb, Ur: 1.5 mg/dL (ref 0.0–1.9)

## 2021-01-06 LAB — SEDIMENTATION RATE: Sed Rate: 16 mm/hr (ref 0–20)

## 2021-01-06 NOTE — Addendum Note (Signed)
Addended by: Orland Mustard on: 01/06/2021 02:58 PM   Modules accepted: Orders

## 2021-01-07 DIAGNOSIS — M7541 Impingement syndrome of right shoulder: Secondary | ICD-10-CM | POA: Diagnosis not present

## 2021-01-07 LAB — SODIUM, URINE, RANDOM: Sodium, Ur: 58 mmol/L (ref 28–272)

## 2021-01-08 ENCOUNTER — Encounter: Payer: Self-pay | Admitting: Internal Medicine

## 2021-01-08 NOTE — Addendum Note (Signed)
Addended by: Orland Mustard on: 01/08/2021 05:19 PM   Modules accepted: Orders

## 2021-01-09 LAB — ANA: Anti Nuclear Antibody (ANA): POSITIVE — AB

## 2021-01-09 LAB — PROTEIN ELECTROPHORESIS, SERUM
Albumin ELP: 4 g/dL (ref 3.8–4.8)
Alpha 1: 0.3 g/dL (ref 0.2–0.3)
Alpha 2: 0.8 g/dL (ref 0.5–0.9)
Beta 2: 0.3 g/dL (ref 0.2–0.5)
Beta Globulin: 0.4 g/dL (ref 0.4–0.6)
Gamma Globulin: 0.9 g/dL (ref 0.8–1.7)
Total Protein: 6.7 g/dL (ref 6.1–8.1)

## 2021-01-09 LAB — ANTI-NUCLEAR AB-TITER (ANA TITER): ANA Titer 1: 1:40 {titer} — ABNORMAL HIGH

## 2021-01-11 ENCOUNTER — Telehealth: Payer: Self-pay

## 2021-01-11 NOTE — Telephone Encounter (Signed)
-----   Message from Delorise Jackson, MD sent at 01/08/2021  5:46 PM EST ----- Mr Humiston what month did you start zoloft ?as this could have side effect of elevated kidney function (<1%) but still a side effect  I am trying to see what could be the cause of this and still figure this out

## 2021-01-11 NOTE — Telephone Encounter (Signed)
-----  Message from Delorise Jackson, MD sent at 01/11/2021 10:34 AM EST ----- Andrew Gonzalez what month did you start zoloft ?as this could have side effect of elevated kidney function (<1%) but still a side effect  I am trying to see what could be the cause of this and still figure this out   ANA autoimmune lab slightly elevated this could be false + In the future we can do further testing to work up if this is true positive or false +  No M spike to indicate multiple myeloma in blood --> urine test pending for this   Kidney function still elevated will monitor for now lets see results of PET and CT scans to see what they show and I hope only good results and pray   Sed rate and CRP normal inflammatory markers

## 2021-01-12 ENCOUNTER — Telehealth: Payer: Self-pay | Admitting: Internal Medicine

## 2021-01-12 ENCOUNTER — Ambulatory Visit
Admission: RE | Admit: 2021-01-12 | Discharge: 2021-01-12 | Disposition: A | Discharge status: Home / Self Care | Payer: Medicare Other | Source: Ambulatory Visit | Attending: Internal Medicine | Admitting: Internal Medicine

## 2021-01-12 ENCOUNTER — Other Ambulatory Visit: Payer: Self-pay

## 2021-01-12 DIAGNOSIS — R634 Abnormal weight loss: Secondary | ICD-10-CM | POA: Insufficient documentation

## 2021-01-12 DIAGNOSIS — M25512 Pain in left shoulder: Secondary | ICD-10-CM | POA: Insufficient documentation

## 2021-01-12 DIAGNOSIS — R948 Abnormal results of function studies of other organs and systems: Secondary | ICD-10-CM | POA: Insufficient documentation

## 2021-01-12 DIAGNOSIS — M79601 Pain in right arm: Secondary | ICD-10-CM | POA: Diagnosis not present

## 2021-01-12 DIAGNOSIS — R918 Other nonspecific abnormal finding of lung field: Secondary | ICD-10-CM | POA: Diagnosis not present

## 2021-01-12 DIAGNOSIS — M899 Disorder of bone, unspecified: Secondary | ICD-10-CM | POA: Diagnosis not present

## 2021-01-12 DIAGNOSIS — M79602 Pain in left arm: Secondary | ICD-10-CM | POA: Insufficient documentation

## 2021-01-12 DIAGNOSIS — R936 Abnormal findings on diagnostic imaging of limbs: Secondary | ICD-10-CM | POA: Diagnosis not present

## 2021-01-12 DIAGNOSIS — I700 Atherosclerosis of aorta: Secondary | ICD-10-CM | POA: Insufficient documentation

## 2021-01-12 DIAGNOSIS — M25511 Pain in right shoulder: Secondary | ICD-10-CM | POA: Diagnosis not present

## 2021-01-12 DIAGNOSIS — I2510 Atherosclerotic heart disease of native coronary artery without angina pectoris: Secondary | ICD-10-CM | POA: Diagnosis not present

## 2021-01-12 DIAGNOSIS — C799 Secondary malignant neoplasm of unspecified site: Secondary | ICD-10-CM | POA: Diagnosis not present

## 2021-01-12 DIAGNOSIS — M898X9 Other specified disorders of bone, unspecified site: Secondary | ICD-10-CM | POA: Insufficient documentation

## 2021-01-12 DIAGNOSIS — M2550 Pain in unspecified joint: Secondary | ICD-10-CM | POA: Insufficient documentation

## 2021-01-12 DIAGNOSIS — J439 Emphysema, unspecified: Secondary | ICD-10-CM | POA: Diagnosis not present

## 2021-01-12 DIAGNOSIS — K5790 Diverticulosis of intestine, part unspecified, without perforation or abscess without bleeding: Secondary | ICD-10-CM | POA: Diagnosis not present

## 2021-01-12 DIAGNOSIS — M4306 Spondylolysis, lumbar region: Secondary | ICD-10-CM | POA: Diagnosis not present

## 2021-01-12 LAB — GLUCOSE, CAPILLARY: Glucose-Capillary: 135 mg/dL — ABNORMAL HIGH (ref 70–99)

## 2021-01-12 MED ORDER — FLUDEOXYGLUCOSE F - 18 (FDG) INJECTION
9.0000 | Freq: Once | INTRAVENOUS | Status: AC | PRN
  Administered 2021-01-12: 9.2 via INTRAVENOUS

## 2021-01-12 NOTE — Telephone Encounter (Signed)
Lft pt vm to call ofc regarding pt appt on 12/07 [facility] Nothing to eat and drink 4 hrs prior and p/u prep. thanks

## 2021-01-13 ENCOUNTER — Ambulatory Visit
Admission: RE | Admit: 2021-01-13 | Discharge: 2021-01-13 | Disposition: A | Discharge status: Home / Self Care | Payer: Medicare Other | Source: Ambulatory Visit | Attending: Internal Medicine | Admitting: Internal Medicine

## 2021-01-13 ENCOUNTER — Encounter: Payer: Self-pay | Admitting: Internal Medicine

## 2021-01-13 DIAGNOSIS — M4317 Spondylolisthesis, lumbosacral region: Secondary | ICD-10-CM | POA: Diagnosis not present

## 2021-01-13 DIAGNOSIS — K5730 Diverticulosis of large intestine without perforation or abscess without bleeding: Secondary | ICD-10-CM | POA: Diagnosis not present

## 2021-01-13 DIAGNOSIS — K6389 Other specified diseases of intestine: Secondary | ICD-10-CM | POA: Diagnosis not present

## 2021-01-13 DIAGNOSIS — M898X9 Other specified disorders of bone, unspecified site: Secondary | ICD-10-CM | POA: Insufficient documentation

## 2021-01-13 DIAGNOSIS — M899 Disorder of bone, unspecified: Secondary | ICD-10-CM | POA: Diagnosis not present

## 2021-01-13 DIAGNOSIS — R634 Abnormal weight loss: Secondary | ICD-10-CM | POA: Insufficient documentation

## 2021-01-13 DIAGNOSIS — C799 Secondary malignant neoplasm of unspecified site: Secondary | ICD-10-CM | POA: Diagnosis not present

## 2021-01-13 LAB — PROTEIN ELECTROPHORESIS, URINE REFLEX
Albumin ELP, Urine: 100 %
Alpha-1-Globulin, U: 0 %
Alpha-2-Globulin, U: 0 %
Beta Globulin, U: 0 %
Gamma Globulin, U: 0 %
Protein, Ur: 5.9 mg/dL

## 2021-01-13 NOTE — Progress Notes (Signed)
Follow-up Outpatient Visit Date: 01/14/2021  Primary Care Provider: McLean-Scocuzza, Nino Glow, MD [place] [id number]  Chief Complaint: Follow-up shortness of breath  HPI:  Mr. Froelich is a 70 y.o. male with history of hypertension, hyperlipidemia, type 2 diabetes mellitus, and COVID-19 infection (10/2019), who presents for follow-up of shortness of breath and cough.  I met him in 01/2020 for evaluation of incidentally noted aortic atherosclerosis on chest radiograph.  At that time, he reported some lingering shortness of breath and cough following his COVID-19 infection but was otherwise feeling well.  We agreed to obtain a pharmacologic myocardial perfusion stress test and abdominal aorta ultrasound.  Both studies were unremarkable other than mild coronary artery calcification and aortic atherosclerosis noted on CT portion of the stress test.  Today, Mr. Sole reports he has been feeling fairly well.  He has been undergoing evaluation of a bone lesion in the left humerus of uncertain significance.  Bone scan had demonstrated very subtle uptake, prompting PET/CT yesterday that was most consistent with an indolent/benign osseous lesion.  From a heart standpoint, Mr. Alma Friendly has been doing well.  He denies chest pain.  He has mild exertional dyspnea when active.  He continues to cough at times, which he attributes to postnasal drip.  He denies palpitations, lightheadedness, and edema.  --------------------------------------------------------------------------------------------------  Cardiovascular History & Procedures: Cardiovascular Problems: Aortic atherosclerosis Shortness of breath   Risk Factors: Aortic atherosclerosis, hypertension, hyperlipidemia, diabetes mellitus, male gender, age > 35, and prior tobacco use   Cath/PCI: None   CV Surgery: None   EP Procedures and Devices: None   Non-Invasive Evaluation(s): Abdominal aorta ultrasound (02/05/2020): No  evidence of aneurysm. Pharmacologic MPI (01/20/2020): Low risk study without ischemia or scar.  LVEF 55-65%.  Mild coronary artery calcifications and aortic atherosclerosis.  Recent CV Pertinent Labs: Lab Results  Component Value Date   CHOL 129 12/16/2020   HDL 40.90 12/16/2020   LDLCALC 69 12/16/2020   TRIG 97.0 12/16/2020   CHOLHDL 3 12/16/2020   K 4.4 01/06/2021   MG 1.3 (L) 10/18/2018   BUN 26 (H) 01/06/2021   BUN 19 11/06/2018   CREATININE 1.53 (H) 01/06/2021    Past medical and surgical history were reviewed and updated in EPIC.  Current Meds  Medication Sig   albuterol (VENTOLIN HFA) 108 (90 Base) MCG/ACT inhaler Inhale 1-2 puffs into the lungs every 6 (six) hours as needed for wheezing or shortness of breath.   amLODipine (NORVASC) 2.5 MG tablet Take 1 tablet (2.5 mg total) by mouth daily.   atorvastatin (LIPITOR) 20 MG tablet Take 1 tablet (20 mg total) by mouth daily at 6 PM.   cetirizine (ZYRTEC) 10 MG tablet Take 1 tablet (10 mg total) by mouth daily.   cholecalciferol (VITAMIN D3) 25 MCG (1000 UNIT) tablet Take 1,000 Units by mouth daily.   Cinnamon 500 MG TABS Take 1 tablet by mouth daily.   cyclobenzaprine (FLEXERIL) 5 MG tablet Take 1-2 tablets (5-10 mg total) by mouth at bedtime as needed for muscle spasms.   fluticasone (FLONASE) 50 MCG/ACT nasal spray Place 2 sprays into both nostrils daily.   glucose blood (FREESTYLE LITE) test strip Use as instructed qd to bid E11.9   Lancets (FREESTYLE) lancets Qd to bid Use as instructed E11.9   metFORMIN (GLUCOPHAGE) 1000 MG tablet Take 1 tablet (1,000 mg total) by mouth 2 (two) times daily with a meal.   Multiple Vitamin (MULTIVITAMIN) tablet Take 1 tablet by mouth daily.  prazosin (MINIPRESS) 1 MG capsule 1 mg qhs   sertraline (ZOLOFT) 100 MG tablet 100 mg 1.5 tablets daily   sitaGLIPtin (JANUVIA) 100 MG tablet Take 1 tablet (100 mg total) by mouth daily.   sodium chloride (OCEAN) 0.65 % SOLN nasal spray Place 1 spray  into both nostrils as needed for congestion.    Allergies: Bactrim [sulfamethoxazole-trimethoprim] and Biofreeze [menthol (topical analgesic)]  Social History   Tobacco Use   Smoking status: Former    Packs/day: 0.50    Years: 20.00    Pack years: 10.00    Types: Cigarettes    Quit date: 2005-04-10    Years since quitting: 15.9   Smokeless tobacco: Never  Vaping Use   Vaping Use: Never used  Substance Use Topics   Alcohol use: Yes    Alcohol/week: 1.0 standard drink    Types: 1 Standard drinks or equivalent per week    Comment: occasional, maybe once a week   Drug use: No    Family History  Problem Relation Age of Onset   Cancer Mother        bone, liver, lung former smoker ? lung died 2008/04/10   Diabetes Mother    Hypertension Mother    Atrial fibrillation Sister     Review of Systems: A 12-system review of systems was performed and was negative except as noted in the HPI.  --------------------------------------------------------------------------------------------------  Physical Exam: BP 120/80 (BP Location: Left Arm, Patient Position: Sitting, Cuff Size: Normal)   Pulse 91   Ht 5' 9"  (1.753 m)   Wt 170 lb (77.1 kg)   SpO2 97%   BMI 25.10 kg/m   General:  NAD. Neck: No JVD or HJR. Lungs: Clear to auscultation bilaterally without wheezes or crackles. Heart: Regular rate and rhythm without murmurs, rubs, or gallops. Abdomen: Soft, nontender, nondistended. Extremities: No lower extremity edema.  EKG: Normal sinus rhythm without abnormality.  Lab Results  Component Value Date   WBC 8.2 12/16/2020   HGB 12.4 (L) 12/16/2020   HCT 36.6 (L) 12/16/2020   MCV 89.8 12/16/2020   PLT 237.0 12/16/2020    Lab Results  Component Value Date   NA 135 01/06/2021   K 4.4 01/06/2021   CL 102 01/06/2021   CO2 24 01/06/2021   BUN 26 (H) 01/06/2021   CREATININE 1.53 (H) 01/06/2021   GLUCOSE 189 (H) 01/06/2021   ALT 16 12/16/2020    Lab Results  Component Value Date    CHOL 129 12/16/2020   HDL 40.90 12/16/2020   LDLCALC 69 12/16/2020   TRIG 97.0 12/16/2020   CHOLHDL 3 12/16/2020    --------------------------------------------------------------------------------------------------  ASSESSMENT AND PLAN: Dyspnea on exertion: Longstanding and better since COVID-19 though not completely resolved.  EKG today is normal.  Prior myocardial perfusion stress test without ischemia or scar.  We have agreed to obtain a transthoracic echocardiogram.  If this does not show any significant structural abnormalities, I think would be reasonable to defer further work-up.  Hypertension: Blood pressure borderline elevated today with DBP of 80 mmHg (goal less than 80 mmHg).  We will defer medication changes at this time.  Ongoing management per Dr. Terese Door.  Hyperlipidemia associated with type 2 diabetes mellitus and aortic atherosclerosis: Lipids at goal on last check.  Continue atorvastatin for target LDL less than 70 in the setting of DM and coronary artery calcification.  Follow-up: Return to clinic in 1 year.  Nelva Bush, MD 01/14/2021 8:12 AM

## 2021-01-13 NOTE — Telephone Encounter (Signed)
Did not see oncology referral in Patient's chart. Please advise

## 2021-01-14 ENCOUNTER — Encounter: Payer: Self-pay | Admitting: Internal Medicine

## 2021-01-14 ENCOUNTER — Ambulatory Visit (INDEPENDENT_AMBULATORY_CARE_PROVIDER_SITE_OTHER): Payer: Medicare Other | Admitting: Internal Medicine

## 2021-01-14 ENCOUNTER — Other Ambulatory Visit: Payer: Self-pay

## 2021-01-14 VITALS — BP 120/80 | HR 91 | Ht 69.0 in | Wt 170.0 lb

## 2021-01-14 DIAGNOSIS — E1169 Type 2 diabetes mellitus with other specified complication: Secondary | ICD-10-CM

## 2021-01-14 DIAGNOSIS — I700 Atherosclerosis of aorta: Secondary | ICD-10-CM | POA: Diagnosis not present

## 2021-01-14 DIAGNOSIS — I10 Essential (primary) hypertension: Secondary | ICD-10-CM | POA: Diagnosis not present

## 2021-01-14 DIAGNOSIS — R0609 Other forms of dyspnea: Secondary | ICD-10-CM

## 2021-01-14 DIAGNOSIS — E785 Hyperlipidemia, unspecified: Secondary | ICD-10-CM

## 2021-01-14 DIAGNOSIS — K5730 Diverticulosis of large intestine without perforation or abscess without bleeding: Secondary | ICD-10-CM | POA: Insufficient documentation

## 2021-01-14 DIAGNOSIS — K639 Disease of intestine, unspecified: Secondary | ICD-10-CM | POA: Insufficient documentation

## 2021-01-14 NOTE — Addendum Note (Signed)
Addended by: Orland Mustard on: 01/14/2021 05:27 PM   Modules accepted: Orders

## 2021-01-14 NOTE — Patient Instructions (Signed)
Medication Instructions:   Your physician recommends that you continue on your current medications as directed. Please refer to the Current Medication list given to you today.   *If you need a refill on your cardiac medications before your next appointment, please call your pharmacy*   Lab Work:  None ordered  Testing/Procedures:  Your physician has requested that you have an echocardiogram. Echocardiography is a painless test that uses sound waves to create images of your heart. It provides your doctor with information about the size and shape of your heart and how well your heart's chambers and valves are working. This procedure takes approximately one hour. There are no restrictions for this procedure.   Follow-Up: At [facility], you and your health needs are our priority.  As part of our continuing mission to provide you with exceptional heart care, we have created designated Provider Care Teams.  These Care Teams include your primary Cardiologist (physician) and Advanced Practice Providers (APPs -  Physician Assistants and Nurse Practitioners) who all work together to provide you with the care you need, when you need it.  We recommend signing up for the patient portal called "MyChart".  Sign up information is provided on this After Visit Summary.  MyChart is used to connect with patients for Virtual Visits (Telemedicine).  Patients are able to view lab/test results, encounter notes, upcoming appointments, etc.  Non-urgent messages can be sent to your provider as well.   To learn more about what you can do with MyChart, go to [email or web address].    Your next appointment:   1 year(s)  The format for your next appointment:   In Person  Provider:   You may see Dr. Harrell Gave End or one of the following Advanced Practice Providers on your designated Care Team:   Murray Hodgkins, NP Christell Faith, PA-C Cadence Kathlen Mody, [place]

## 2021-01-18 ENCOUNTER — Other Ambulatory Visit: Payer: Self-pay | Admitting: Internal Medicine

## 2021-01-19 ENCOUNTER — Telehealth: Payer: Self-pay | Admitting: Internal Medicine

## 2021-01-19 NOTE — Telephone Encounter (Signed)
Called Patient and he states he wants all records from the last 2 years. Informed Patient that this will need to be sent over to medical records.  Patient verbalized understanding  Sent form to medical records, made copy of this and sent this to scan

## 2021-01-19 NOTE — Telephone Encounter (Signed)
Patient dropped off medical records [facility] form. Checked boxes for History and physical within 1 year and Radiology reports within 2 years.   Needing to know if Patient is only wanted his most recent imaging and physical sent or all of the records over the last 2 years.   If Patient wanting all records sent I will have to send this to the [facility] records team as our office does not handle records request of this magnitude.   Left message to return call.

## 2021-01-19 NOTE — Telephone Encounter (Signed)
Pt returning call. Pt requesting callback.  °

## 2021-01-22 ENCOUNTER — Encounter: Payer: Self-pay | Admitting: Internal Medicine

## 2021-01-24 ENCOUNTER — Encounter: Payer: Self-pay | Admitting: Internal Medicine

## 2021-01-25 ENCOUNTER — Encounter: Payer: Self-pay | Admitting: Internal Medicine

## 2021-01-25 DIAGNOSIS — Z9989 Dependence on other enabling machines and devices: Secondary | ICD-10-CM | POA: Insufficient documentation

## 2021-01-25 NOTE — Telephone Encounter (Signed)
Attached note to the 01/24/21 Patient message encounter

## 2021-01-25 NOTE — Telephone Encounter (Signed)
Andrew Gonzalez  P [facility] Clinical Pool (supporting McLean-Scocuzza, Nino Glow, MD) 3 days ago   I had the sleep study last night (Dec 15th) and after the first hour the tech came in and told me that I had sleep apenea and put me on the CPA machine Will be getting a machine next wrrk from [employer]  For your information

## 2021-02-04 ENCOUNTER — Encounter (HOSPITAL_COMMUNITY): Payer: Self-pay | Admitting: Radiology

## 2021-02-09 ENCOUNTER — Ambulatory Visit (INDEPENDENT_AMBULATORY_CARE_PROVIDER_SITE_OTHER): Payer: Medicare Other

## 2021-02-09 VITALS — Ht 69.0 in | Wt 170.0 lb

## 2021-02-09 DIAGNOSIS — Z0000 Encounter for general adult medical examination without abnormal findings: Secondary | ICD-10-CM | POA: Diagnosis not present

## 2021-02-09 NOTE — Progress Notes (Signed)
Subjective:   Andrew Gonzalez is a 71 y.o. male who presents for Medicare Annual/Subsequent preventive examination.  Review of Systems    No ROS.  Medicare Wellness Virtual Visit.  Visual/audio telehealth visit, UTA vital signs.   See social history for additional risk factors.   Cardiac Risk Factors include: advanced age (>60men, >60 women);male gender;diabetes mellitus;hypertension     Objective:    Today's Vitals   02/09/21 1403  Weight: 170 lb (77.1 kg)  Height: 5\' 9"  (1.753 m)   Body mass index is 25.1 kg/m.  Advanced Directives 02/09/2021 12/12/2019 08/22/2019 12/11/2018 01/08/2018 12/07/2017 11/07/2017  Does Patient Have a Medical Advance Directive? Yes Yes No Yes No No No  Type of Paramedic of [place];Living will [place];Living will - [place];Living will - - -  Does patient want to make changes to medical advance directive? No - Patient declined No - Patient declined - No - Patient declined - - -  Copy of [place] in Chart? Yes - validated most recent copy scanned in chart (See row information) Yes - validated most recent copy scanned in chart (See row information) - No - copy requested - - -  Would patient like information on creating a medical advance directive? - - - - No - Patient declined No - Patient declined Yes (MAU/Ambulatory/Procedural Areas - Information given)    Current Medications (verified) Outpatient Encounter Medications as of 02/09/2021  Medication Sig   albuterol (VENTOLIN HFA) 108 (90 Base) MCG/ACT inhaler Inhale 1-2 puffs into the lungs every 6 (six) hours as needed for wheezing or shortness of breath.   amLODipine (NORVASC) 2.5 MG tablet Take 1 tablet (2.5 mg total) by mouth daily.   atorvastatin (LIPITOR) 20 MG tablet Take 1 tablet (20 mg total) by mouth daily at 6 PM.   cetirizine (ZYRTEC) 10 MG tablet Take 1 tablet (10 mg total) by mouth daily.   cholecalciferol (VITAMIN  D3) 25 MCG (1000 UNIT) tablet Take 1,000 Units by mouth daily.   Cinnamon 500 MG TABS Take 1 tablet by mouth daily.   cyclobenzaprine (FLEXERIL) 5 MG tablet Take 1-2 tablets (5-10 mg total) by mouth at bedtime as needed for muscle spasms.   fluticasone (FLONASE) 50 MCG/ACT nasal spray Place 2 sprays into both nostrils daily.   glucose blood (FREESTYLE LITE) test strip Use as instructed qd to bid E11.9   Lancets (FREESTYLE) lancets Qd to bid Use as instructed E11.9   Multiple Vitamin (MULTIVITAMIN) tablet Take 1 tablet by mouth daily.   prazosin (MINIPRESS) 1 MG capsule 1 mg qhs   sertraline (ZOLOFT) 100 MG tablet 100 mg 1.5 tablets daily   sitaGLIPtin (JANUVIA) 100 MG tablet Take 1 tablet (100 mg total) by mouth daily.   sodium chloride (OCEAN) 0.65 % SOLN nasal spray Place 1 spray into both nostrils as needed for congestion.   No facility-administered encounter medications on file as of 02/09/2021.    Allergies (verified) Bactrim [sulfamethoxazole-trimethoprim] and Biofreeze [menthol (topical analgesic)]   History: Past Medical History:  Diagnosis Date   Allergy    Chicken pox    Colon polyps    COVID-19    11/06/19 did not have mab infusion   Diabetes mellitus without complication ([place])    Hyperlipidemia    Hypertension    Past Surgical History:  Procedure Laterality Date   TONSILLECTOMY     Family History  Problem Relation Age of Onset   Cancer Mother  bone, liver, lung former smoker ? lung died 05/04/2008   Diabetes Mother    Hypertension Mother    Atrial fibrillation Sister    Social History   Socioeconomic History   Marital status: Married    Spouse name: Not on file   Number of children: Not on file   Years of education: Not on file   Highest education level: Not on file  Occupational History   Not on file  Tobacco Use   Smoking status: Former    Packs/day: 0.50    Years: 20.00    Pack years: 10.00    Types: Cigarettes    Quit date: 05/04/2005    Years since  quitting: 16.0   Smokeless tobacco: Never  Vaping Use   Vaping Use: Never used  Substance and Sexual Activity   Alcohol use: Yes    Alcohol/week: 1.0 standard drink    Types: 1 Standard drinks or equivalent per week    Comment: occasional, maybe once a week   Drug use: No   Sexual activity: Yes    Partners: Female  Other Topics Concern   Not on file  Social History Narrative   Wears selt belt    Safe in relationship    Recently moved from [facility] 2018/19 originally from [facility] dad and sister 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    Retired Occupational hygienist degree    Social Determinants of Radio broadcast assistant Strain: Low Risk    Difficulty of Paying Living Expenses: Not hard at all  Food Insecurity: No Food Insecurity   Worried About Charity fundraiser in the Last Year: Never true   Arboriculturist in the Last Year: Never true  Transportation Needs: No Transportation Needs   Lack of Transportation (Medical): No   Lack of Transportation (Non-Medical): No  Physical Activity: Insufficiently Active   Days of Exercise per Week: 3 days   Minutes of Exercise per Session: 30 min  Stress: No Stress Concern Present   Feeling of Stress : Not at all  Social Connections: Unknown   Frequency of Communication with Friends and Family: Not on file   Frequency of Social Gatherings with Friends and Family: Not on file   Attends Religious Services: Not on Electrical engineer or Organizations: Not on file   Attends Archivist Meetings: Not on file   Marital Status: Married    Tobacco Counseling Counseling given: Not Answered   Clinical Intake:  Pre-visit preparation completed: Yes        Diabetes: Yes (Followed by PCP and [facility])  How often do you need to have someone help you when you read instructions, pamphlets, or other written materials from your doctor or pharmacy?: 1 - Never Interpreter Needed?: No    Activities of Daily Living In your  present state of health, do you have any difficulty performing the following activities: 02/09/2021  Hearing? N  Vision? N  Difficulty concentrating or making decisions? Y  Comment Followed by Psychiatry and Counseling.  Walking or climbing stairs? N  Dressing or bathing? N  Doing errands, shopping? N  Preparing Food and eating ? N  Using the Toilet? N  In the past six months, have you accidently leaked urine? N  Do you have problems with loss of bowel control? N  Managing your Medications? N  Managing your Finances? N  Housekeeping or managing your Housekeeping? N  Some recent data might  be hidden   Patient Care Team: McLean-Scocuzza, Nino Glow, MD as PCP - General (Internal Medicine)  Indicate any recent Medical Services you may have received from other than [facility] providers in the past year (date may be approximate).     Assessment:   This is a routine wellness examination for [name].  Virtual Visit via Telephone Note  I connected with  Andrew Gonzalez on 02/09/21 at  2:00 PM EST by telephone and verified that I am speaking with the correct person using two identifiers.  Persons participating in the virtual visit: patient/Nurse Health Advisor   I discussed the limitations, risks, security and privacy concerns of performing an evaluation and management service by telephone and the availability of in person appointments. The patient expressed understanding and agreed to proceed.  Interactive audio and video telecommunications were attempted between this nurse and patient, however failed, due to patient having technical difficulties OR patient did not have access to video capability.  We continued and completed visit with audio only.  Some vital signs may be absent or patient reported.   Hearing/Vision screen Hearing Screening - Comments:: Patient has some difficulty hearing conversational tones.He does not wear hearing aids.  Vision Screening - Comments:: Followed by [facility] Wears corrective lenses  No retinopathy reported.  They have seen their ophthalmologist in the last 12 months.   Dietary issues and exercise activities discussed: Current Exercise Habits: Home exercise routine, Intensity: Mild Low carb diet Good water intake   Goals Addressed               This Visit's Progress     Patient Stated     Weight 165lb (pt-stated)   On track      Depression Screen PHQ 2/9 Scores 02/09/2021 12/17/2020 12/12/2019 05/08/2019 12/11/2018 01/22/2018 12/07/2017  PHQ - 2 Score - 4 0 0 0 0 0  PHQ- 9 Score - 15 - - - - -  Exception Documentation Other- indicate reason in comment box - - - - - -  Not completed Monthly visits with psychiatry. Weekly therapy visits with therapist. - - - - - -    Fall Risk Fall Risk  02/09/2021 12/17/2020 06/16/2020 12/27/2019 12/18/2019  Falls in the past year? 0 0 0 0 0  Number falls in past yr: 0 0 0 0 0  Injury with Fall? - 0 0 0 0  Follow up Falls evaluation completed Falls evaluation completed Falls evaluation completed Falls evaluation completed Falls evaluation completed   [place]: Home free of loose throw rugs in walkways, pet beds, electrical cords, etc? Yes  Adequate lighting in your home to reduce risk of falls? Yes   ASSISTIVE DEVICES UTILIZED TO PREVENT FALLS: Life alert? No  Use of a cane, walker or w/c? No   TIMED UP AND GO: Was the test performed? No .   Cognitive Function: Patient is alert and oriented x3.  MMSE - Mini Mental State Exam 12/07/2017  Orientation to time 5  Orientation to Place 5  Registration 3  Attention/ Calculation 5  Recall 3  Language- name 2 objects 2  Language- repeat 1  Language- follow 3 step command 3  Language- read & follow direction 1  Write a sentence 1  Copy design 1  Total score 30     6CIT Screen 02/09/2021 12/11/2018  What Year? 0 points 0 points  What month? 0 points 0 points  What time? 0 points 0 points  Count back  from 20 - 0 points  Months in reverse 0 points 0 points  Repeat phrase - 0 points  Total Score - 0   Immunizations Immunization History  Administered Date(s) Administered   Fluad Quad(high Dose 65+) 10/18/2018, 12/18/2019, 12/17/2020   Influenza, High Dose Seasonal PF 10/16/2017   Influenza-Unspecified 12/22/2016   PFIZER(Purple Top)SARS-COV-2 Vaccination 03/27/2019, 04/17/2019   Pneumococcal Conjugate-13 10/18/2017   Pneumococcal Polysaccharide-23 04/01/2020   Tdap 05/04/2017   Zoster Recombinat (Shingrix) 08/27/2017, 11/30/2017   Zoster, Live 08/27/2017   Screening Tests Health Maintenance  Topic Date Due   COVID-19 Vaccine (3 - Pfizer risk series) 02/25/2021 (Originally 05/15/2019)   HEMOGLOBIN A1C  06/15/2021   OPHTHALMOLOGY EXAM  08/13/2021   FOOT EXAM  12/01/2021   COLONOSCOPY (Pts 45-80yrs Insurance coverage will need to be confirmed)  11/22/2025   TETANUS/TDAP  05/05/2027   Pneumonia Vaccine 9+ Years old  Completed   INFLUENZA VACCINE  Completed   Hepatitis C Screening  Completed   Zoster Vaccines- Shingrix  Completed   HPV VACCINES  Aged Out   Health Maintenance There are no preventive care reminders to display for this patient.  Lung Cancer Screening: (Low Dose CT Chest recommended if Age 56-80 years, 30 pack-year currently smoking OR have quit w/in 15years.) does not qualify.   Vision Screening: Recommended annual ophthalmology exams for early detection of glaucoma and other disorders of the eye.  Dental Screening: Recommended annual dental exams for proper oral hygiene  Community Resource Referral / Chronic Care Management: CRR required this visit?  No   CCM required this visit?  No      Plan:   Keep all routine maintenance appointments.   I have personally reviewed and noted the following in the patients chart:   Medical and social history Use of alcohol, tobacco or illicit drugs  Current medications and supplements including opioid prescriptions.  Patient is not currently taking opioid prescriptions. Functional ability and status Nutritional status Physical activity Advanced directives List of other physicians Hospitalizations, surgeries, and ER visits in previous 12 months Vitals Screenings to include cognitive, depression, and falls Referrals and appointments  In addition, I have reviewed and discussed with patient certain preventive protocols, quality metrics, and best practice recommendations. A written personalized care plan for preventive services as well as general preventive health recommendations were provided to patient.     Varney Biles, LPN   03/16/3783

## 2021-02-09 NOTE — Patient Instructions (Addendum)
°  [name] , Thank you for taking time to come for your Medicare Wellness Visit. I appreciate your ongoing commitment to your health goals. Please review the following plan we discussed and let me know if I can assist you in the future.   These are the goals we discussed:  Goals       Patient Stated     Weight 165lb (pt-stated)        This is a list of the screening recommended for you and due dates:  Health Maintenance  Topic Date Due   COVID-19 Vaccine (3 - Pfizer risk series) 02/25/2021*   Hemoglobin A1C  06/15/2021   Eye exam for diabetics  08/13/2021   Complete foot exam   12/01/2021   Colon Cancer Screening  11/22/2025   Tetanus Vaccine  05/05/2027   Pneumonia Vaccine  Completed   Flu Shot  Completed   Hepatitis C Screening: USPSTF Recommendation to screen - Ages 18-79 yo.  Completed   Zoster (Shingles) Vaccine  Completed   HPV Vaccine  Aged Out  *Topic was postponed. The date shown is not the original due date.

## 2021-02-12 ENCOUNTER — Other Ambulatory Visit: Payer: Self-pay

## 2021-02-12 ENCOUNTER — Emergency Department: Payer: Medicare Other

## 2021-02-12 ENCOUNTER — Emergency Department
Admission: EM | Admit: 2021-02-12 | Discharge: 2021-02-12 | Disposition: A | Discharge status: Home / Self Care | Payer: Medicare Other | Attending: Emergency Medicine | Admitting: Emergency Medicine

## 2021-02-12 ENCOUNTER — Encounter: Payer: Self-pay | Admitting: Emergency Medicine

## 2021-02-12 DIAGNOSIS — R52 Pain, unspecified: Secondary | ICD-10-CM | POA: Diagnosis not present

## 2021-02-12 DIAGNOSIS — I6522 Occlusion and stenosis of left carotid artery: Secondary | ICD-10-CM | POA: Diagnosis not present

## 2021-02-12 DIAGNOSIS — S300XXA Contusion of lower back and pelvis, initial encounter: Secondary | ICD-10-CM | POA: Diagnosis not present

## 2021-02-12 DIAGNOSIS — S4992XA Unspecified injury of left shoulder and upper arm, initial encounter: Secondary | ICD-10-CM | POA: Diagnosis not present

## 2021-02-12 DIAGNOSIS — S199XXA Unspecified injury of neck, initial encounter: Secondary | ICD-10-CM | POA: Diagnosis not present

## 2021-02-12 DIAGNOSIS — E1165 Type 2 diabetes mellitus with hyperglycemia: Secondary | ICD-10-CM | POA: Insufficient documentation

## 2021-02-12 DIAGNOSIS — R55 Syncope and collapse: Secondary | ICD-10-CM | POA: Diagnosis not present

## 2021-02-12 DIAGNOSIS — Z8616 Personal history of COVID-19: Secondary | ICD-10-CM | POA: Insufficient documentation

## 2021-02-12 DIAGNOSIS — W19XXXA Unspecified fall, initial encounter: Secondary | ICD-10-CM | POA: Diagnosis not present

## 2021-02-12 DIAGNOSIS — S0101XA Laceration without foreign body of scalp, initial encounter: Secondary | ICD-10-CM | POA: Diagnosis not present

## 2021-02-12 DIAGNOSIS — W1839XA Other fall on same level, initial encounter: Secondary | ICD-10-CM | POA: Insufficient documentation

## 2021-02-12 DIAGNOSIS — R739 Hyperglycemia, unspecified: Secondary | ICD-10-CM

## 2021-02-12 DIAGNOSIS — I10 Essential (primary) hypertension: Secondary | ICD-10-CM | POA: Diagnosis not present

## 2021-02-12 DIAGNOSIS — S0990XA Unspecified injury of head, initial encounter: Secondary | ICD-10-CM | POA: Diagnosis not present

## 2021-02-12 DIAGNOSIS — Y92009 Unspecified place in unspecified non-institutional (private) residence as the place of occurrence of the external cause: Secondary | ICD-10-CM | POA: Diagnosis not present

## 2021-02-12 DIAGNOSIS — M47816 Spondylosis without myelopathy or radiculopathy, lumbar region: Secondary | ICD-10-CM | POA: Diagnosis not present

## 2021-02-12 DIAGNOSIS — Z743 Need for continuous supervision: Secondary | ICD-10-CM | POA: Diagnosis not present

## 2021-02-12 DIAGNOSIS — S40012A Contusion of left shoulder, initial encounter: Secondary | ICD-10-CM | POA: Diagnosis not present

## 2021-02-12 DIAGNOSIS — R58 Hemorrhage, not elsewhere classified: Secondary | ICD-10-CM | POA: Diagnosis not present

## 2021-02-12 DIAGNOSIS — M5030 Other cervical disc degeneration, unspecified cervical region: Secondary | ICD-10-CM | POA: Diagnosis not present

## 2021-02-12 DIAGNOSIS — M542 Cervicalgia: Secondary | ICD-10-CM | POA: Diagnosis not present

## 2021-02-12 LAB — COMPREHENSIVE METABOLIC PANEL
ALT: 17 U/L (ref 0–44)
AST: 21 U/L (ref 15–41)
Albumin: 3.7 g/dL (ref 3.5–5.0)
Alkaline Phosphatase: 88 U/L (ref 38–126)
Anion gap: 10 (ref 5–15)
BUN: 20 mg/dL (ref 8–23)
CO2: 22 mmol/L (ref 22–32)
Calcium: 9.1 mg/dL (ref 8.9–10.3)
Chloride: 103 mmol/L (ref 98–111)
Creatinine, Ser: 1.42 mg/dL — ABNORMAL HIGH (ref 0.61–1.24)
GFR, Estimated: 53 mL/min — ABNORMAL LOW (ref >60)
Glucose, Bld: 257 mg/dL — ABNORMAL HIGH (ref 70–99)
Potassium: 4.6 mmol/L (ref 3.5–5.1)
Sodium: 135 mmol/L (ref 135–145)
Total Bilirubin: 1 mg/dL (ref 0.3–1.2)
Total Protein: 6.8 g/dL (ref 6.5–8.1)

## 2021-02-12 LAB — TROPONIN I (HIGH SENSITIVITY): Troponin I (High Sensitivity): 9 ng/L (ref <18)

## 2021-02-12 LAB — CBC
HCT: 36.1 % — ABNORMAL LOW (ref 39.0–52.0)
Hemoglobin: 12.3 g/dL — ABNORMAL LOW (ref 13.0–17.0)
MCH: 31.1 pg (ref 26.0–34.0)
MCHC: 34.1 g/dL (ref 30.0–36.0)
MCV: 91.2 fL (ref 80.0–100.0)
Platelets: 229 10*3/uL (ref 150–400)
RBC: 3.96 MIL/uL — ABNORMAL LOW (ref 4.22–5.81)
RDW: 12.2 % (ref 11.5–15.5)
WBC: 9.1 10*3/uL (ref 4.0–10.5)
nRBC: 0 % (ref 0.0–0.2)

## 2021-02-12 MED ORDER — LIDOCAINE-EPINEPHRINE 2 %-1:100000 IJ SOLN
20.0000 mL | Freq: Once | INTRAMUSCULAR | Status: AC
  Administered 2021-02-12: 20 mL via INTRADERMAL

## 2021-02-12 MED ORDER — HYDROCODONE-ACETAMINOPHEN 5-325 MG PO TABS: 1.0000 | ORAL_TABLET | Freq: Four times a day (QID) | ORAL | 0 refills | Status: DC | PRN

## 2021-02-12 MED ORDER — OXYCODONE-ACETAMINOPHEN 5-325 MG PO TABS
1.0000 | ORAL_TABLET | Freq: Once | ORAL | Status: AC
  Administered 2021-02-12: 1 via ORAL
  Filled 2021-02-12: qty 1

## 2021-02-12 MED ORDER — ACETAMINOPHEN 500 MG PO TABS
ORAL_TABLET | ORAL | Status: AC
  Filled 2021-02-12: qty 2

## 2021-02-12 MED ORDER — ACETAMINOPHEN 325 MG PO TABS
650.0000 mg | ORAL_TABLET | Freq: Once | ORAL | Status: AC
  Administered 2021-02-12: 650 mg via ORAL

## 2021-02-12 MED ORDER — HYDROCODONE-ACETAMINOPHEN 5-325 MG PO TABS: 1.0000 | ORAL_TABLET | Freq: Four times a day (QID) | ORAL | 0 refills | Status: AC | PRN

## 2021-02-12 MED ORDER — ONDANSETRON 4 MG PO TBDP
4.0000 mg | ORAL_TABLET | Freq: Once | ORAL | Status: AC
  Administered 2021-02-12: 4 mg via ORAL
  Filled 2021-02-12: qty 1

## 2021-02-12 MED ORDER — NAPROXEN 500 MG PO TABS: 500.0000 mg | ORAL_TABLET | Freq: Once | ORAL | Status: DC

## 2021-02-12 MED ORDER — LIDOCAINE 5 % EX PTCH
1.0000 | MEDICATED_PATCH | CUTANEOUS | Status: DC
  Administered 2021-02-12: 1 via TRANSDERMAL
  Filled 2021-02-12: qty 1

## 2021-02-12 NOTE — ED Triage Notes (Signed)
Pt brought in by [facility] for falls, unsure of why he fell. States hit wall when he fell, laceration noted to back of head with bleeding controlled. Pt states he had loc, is co neck and lower back pain.

## 2021-02-12 NOTE — ED Provider Notes (Signed)
[facility] Provider Note    Event Date/Time   First MD Initiated Contact with Patient 02/12/21 1009     (approximate)   History   Fall   HPI  Andrew Gonzalez is a 71 y.o. male with past medical history of HTN, HPL, DM, aortic atherosclerosis and some chronic dyspnea with exertion since COVID infection in 2021 who presents for assessment after a fall that occurred earlier today.  Patient states he was walking in his home and does not remember exactly what happened but waking up on the floor with headache, left shoulder pain and low back pain.  States this is in the same place he has some chronic low back pain from spinal stenosis.  He is not on any blood thinners.  He denies feeling any chest pain lightheadedness or dizziness beforehand.  Otherwise he has been in his usual state health without any recent fevers, chills, cough, increased from his chronic shortness of breath with exertion, vomiting, diarrhea, rash, burning with urination or other recent falls or injuries.  No seizure history and review of records.      Physical Exam  Triage Vital Signs: ED Triage Vitals  Enc Vitals Group     BP 02/12/21 0746 123/65     Pulse Rate 02/12/21 0746 (!) 103     Resp 02/12/21 0746 20     Temp 02/12/21 0746 98 F (36.7 C)     Temp Source 02/12/21 0746 Oral     SpO2 02/12/21 0746 100 %     Weight 02/12/21 0747 168 lb (76.2 kg)     Height 02/12/21 0747 5\' 9"  (1.753 m)     Head Circumference --      Peak Flow --      Pain Score 02/12/21 0747 10     Pain Loc --      Pain Edu? --      Excl. in [place]? --     Most recent vital signs: Vitals:   02/12/21 0746 02/12/21 1148  BP: 123/65 120/60  Pulse: (!) 103 99  Resp: 20 20  Temp: 98 F (36.7 C)   SpO2: 100% 100%    General: Awake, appears mildly uncomfortable. CV:  Good peripheral perfusion.  2+ bilateral radial and DP pulses. Resp:  Normal effort.  Clear bilaterally. Abd:  No distention.  Soft  throughout. Other:  Cranial nerves II through XII grossly intact.  There is approximately 3 cm linear hemostatic laceration over the left posterior scalp with a small 1 cm connecting laceration foreign a Y shaped injury..  No other obvious trauma to the face scalp head or neck.  There is no tenderness over the C or T-spine but there is some over the L-spine.  There is also some tenderness circumferentially about the left shoulder.  There is no large effusion or obvious deformity.  Patient has some limitation range of motion.  There is no tenderness deformity or limitation range of motion at the left elbow, wrist or the right upper extremity or bilateral lower extremities.  Sensation is intact light touch lower extremities.   ED Results / Procedures / Treatments  Labs (all labs ordered are listed, but only abnormal results are displayed) Labs Reviewed  CBC - Abnormal; Notable for the following components:      Result Value   RBC 3.96 (*)    Hemoglobin 12.3 (*)    HCT 36.1 (*)    All other components within normal limits  COMPREHENSIVE METABOLIC  PANEL - Abnormal; Notable for the following components:   Glucose, Bld 257 (*)    Creatinine, Ser 1.42 (*)    GFR, Estimated 53 (*)    All other components within normal limits  TROPONIN I (HIGH SENSITIVITY)     EKG  EKG shows sinus rhythm with a ventricular rate of 100, normal axis, unremarkable intervals without evidence of acute ischemia or significant arrhythmia.   RADIOLOGY  CT head interpreted by myself shows some age-related atrophy but no evidence of skull fracture, intracranial hemorrhage, ischemia, mass, ventriculomegaly or other clear acute intracranial process.  CT C-spine shows no acute traumatic subluxation or fracture.  There is evidence of multilevel degenerative disc disease.  This was also interpreted by myself.  Chest x-ray shows no focal consolidation, rib fracture, effusion, edema, pneumothorax or other clear acute  process.  This was interpreted by myself.  Plain film of the L-spine shows no acute fracture dislocation.  There is spondylosis first-degree anterolisthesis.  Plain film of the left shoulder shows no acute fracture dislocation.  There are some calcifications of the greater tuberosity of the humerus and an area of faint lucency in the proximal shaft of left humerus consistent with possible neoplastic process versus bone cyst.  This study was interpreted by myself.  I reviewed radiology's interpretation for above-noted studies and agree with their findings.    PROCEDURES:  Critical Care performed: No  ..Laceration Repair  Date/Time: 02/12/2021 11:31 AM Performed by: Lucrezia Starch, MD Authorized by: Lucrezia Starch, MD   Consent:    Consent obtained:  Verbal   Consent given by:  Patient   Risks discussed:  Pain and infection Universal protocol:    Patient identity confirmed:  Verbally with patient Anesthesia:    Anesthesia method:  Local infiltration   Local anesthetic:  Lidocaine 1% w/o epi Laceration details:    Location:  Scalp   Scalp location:  L parietal   Length (cm):  3 Exploration:    Limited defect created (wound extended): no     Wound exploration: entire depth of wound visualized     Contaminated: no   Treatment:    Area cleansed with:  Saline   Amount of cleaning:  Extensive   Irrigation solution:  Sterile saline   Irrigation method:  Syringe   Visualized foreign bodies/material removed: no   Skin repair:    Repair method:  Staples   Number of staples:  11 Approximation:    Approximation:  Close Repair type:    Repair type:  Simple Post-procedure details:    Dressing:  Open (no dressing)   Procedure completion:  Tolerated well, no immediate complications    MEDICATIONS ORDERED IN ED: Medications  lidocaine (LIDODERM) 5 % 1 patch (1 patch Transdermal Patch Applied 02/12/21 1157)  oxyCODONE-acetaminophen (PERCOCET/ROXICET) 5-325 MG per tablet 1  tablet (1 tablet Oral Given 02/12/21 1023)  lidocaine-EPINEPHrine (XYLOCAINE W/EPI) 2 %-1:100000 (with pres) injection 20 mL (20 mLs Intradermal Given 02/12/21 1026)  acetaminophen (TYLENOL) tablet 650 mg (650 mg Oral Given 02/12/21 1028)  ondansetron (ZOFRAN-ODT) disintegrating tablet 4 mg (4 mg Oral Given 02/12/21 1142)     IMPRESSION / MDM / [place] / ED COURSE  I reviewed the triage vital signs and the nursing notes.                              With regard to etiology for fall differential includes possible mechanical  versus syncopal with a lower suspicion for seizure at this time.  Differential for syncope includes arrhythmia, anemia, vasovagal, orthostatic, with a lower suspicion for CVA given no clear objective deficits aside from pain limitation in the left shoulder on exam.  Patient denies any chest pain shortness of breath not lower suspicion for dissection.  He is slightly tachycardic he has no tachypnea, exam and does not have any other clear risk factors or exam features concerning for a PE at this time.  She does not appear volume overloaded and there is no evidence of CHF on patient's chest x-ray, pneumonia, pneumothorax or rib fracture.  Differential diagnosis includes, but is not limited to possible underlying skull fracture intracranial hemorrhage in the neck and possible occult C-spine injury from distracting skull laceration.  In addition with regards to patient's low back pain differential includes contusion, muscle strain and possible fracture.  Why do not feel deformity or large effusion of the left shoulder is possible also there is an injury here.  CT head interpreted by myself shows some age-related atrophy but no evidence of skull fracture, intracranial hemorrhage, ischemia, mass, ventriculomegaly or other clear acute intracranial process.  CT C-spine shows no acute traumatic subluxation or fracture.  There is evidence of multilevel degenerative disc disease.  This  was also interpreted by myself.  Plain film of the L-spine shows no acute fracture dislocation.  There is spondylosis first-degree anterolisthesis.  Plain film of the left shoulder shows no acute fracture dislocation.  There are some calcifications of the greater tuberosity of the humerus and an area of faint lucency in the proximal shaft of left humerus consistent with possible neoplastic process versus bone cyst.  This study was interpreted by myself.  I reviewed radiology's interpretation for above-noted studies and agree with their findings.   Suspect likely contusion of the low back with muscle strain and contusion left shoulder.  I discussed findings and left shoulder patient states he is already had this worked up extensively including a bone scan and a PET scan which were both unremarkable.  Do not believe this requires further emergency work-up.   EKG shows sinus rhythm with a ventricular rate of 100, normal axis, unremarkable intervals without evidence of acute ischemia or significant arrhythmia.  Given nonelevated troponin with patient denying any chest pain  I have low suspicion t for ACS.  CMP remarkable for glucose of 257 without any other significant lecture light or metabolic derangements.  Kidney function with a creatinine of 1.42 is compared to 1.34-month ago.  CBC shows no leukocytosis and hemoglobin of 12.3 compared to 12.24-month ago.  No evidence of acute anemia.  Platelets are normal.  On further questioning patient states he may be had slipped on his pants which have gotten caught under his feet.  Although certainly possible I do not appreciate at this time other immediately life-threatening etiology for patient's fall.  He is able to stand and ambulate unassisted emergency room following scalp repair as noted above in.  Patient was told by his PCP that he cannot take NSAIDs.  He was given some Norco in the emergency room and was feeling little better after this.  I advised using  Tylenol only initially patient's wife and wife are adamant that he receive something a little stronger.  I discussed risks of oversedation and increased risk of falls in elderly patients on opioids and patient and wife state they understand this but still wish for prescription for a couple doses which I think  is reasonable given size of patient's scalp lack and that he is still fairly sore.  Given stable vitals otherwise reassuring exam and work-up emergency room I think he is stable for discharge with close outpatient PCP follow-up.  Recommended having his blood sugar rechecked in 2 or 3 days.  Discussed stable care.  Discharged in stable condition.  Strict return precautions advised and discussed.      FINAL CLINICAL IMPRESSION(S) / ED DIAGNOSES   Final diagnoses:  Fall, initial encounter  Laceration of scalp, initial encounter  Hyperglycemia  Contusion of left shoulder, initial encounter  Contusion of lower back, initial encounter     Rx / DC Orders   ED Discharge Orders          Ordered    HYDROcodone-acetaminophen (NORCO) 5-325 MG tablet  Every 6 hours PRN        02/12/21 1155             Note:  This document was prepared using Dragon voice recognition software and may include unintentional dictation errors.   Lucrezia Starch, MD 02/12/21 1200

## 2021-02-12 NOTE — ED Notes (Signed)
Patient given instructions to got opcp and get staplees out in 7-10 days.  Also rx will be sent to [facility].  Skig pad does not work.

## 2021-02-19 ENCOUNTER — Encounter: Payer: Self-pay | Admitting: Internal Medicine

## 2021-02-19 ENCOUNTER — Other Ambulatory Visit: Payer: Self-pay

## 2021-02-19 ENCOUNTER — Ambulatory Visit (INDEPENDENT_AMBULATORY_CARE_PROVIDER_SITE_OTHER): Payer: Medicare Other | Admitting: Internal Medicine

## 2021-02-19 VITALS — BP 140/80 | HR 87 | Temp 97.7°F | Ht 69.0 in | Wt 169.0 lb

## 2021-02-19 DIAGNOSIS — T148XXA Other injury of unspecified body region, initial encounter: Secondary | ICD-10-CM | POA: Diagnosis not present

## 2021-02-19 DIAGNOSIS — Z4802 Encounter for removal of sutures: Secondary | ICD-10-CM | POA: Diagnosis not present

## 2021-02-19 MED ORDER — DOXYCYCLINE HYCLATE 100 MG PO TABS: 100.0000 mg | ORAL_TABLET | Freq: Two times a day (BID) | ORAL | 0 refills | Status: DC

## 2021-02-19 MED ORDER — MUPIROCIN 2 % EX OINT: 1.0000 "application " | TOPICAL_OINTMENT | Freq: Two times a day (BID) | CUTANEOUS | 0 refills | Status: AC

## 2021-02-19 NOTE — Progress Notes (Signed)
Chief Complaint  Patient presents with   Follow-up    ED   Ef f/u 02/12/21 with left parietal scalp wound with wound and 11 staples in there he is not sure of circumstance surrounding his fall not sure if had LOC/syncope appt upcoming with Dr. Saunders Revel for cardiac w/u  Suture removal today  Review of Systems  Constitutional:  Negative for weight loss.  HENT:  Negative for hearing loss.   Eyes:  Negative for blurred vision.  Respiratory:  Negative for shortness of breath.   Cardiovascular:  Negative for chest pain.  Gastrointestinal:  Negative for abdominal pain and blood in stool.  Musculoskeletal:  Negative for back pain.  Skin:  Negative for rash.  Neurological:  Negative for headaches.  Psychiatric/Behavioral:  Negative for depression.   Past Medical History:  Diagnosis Date   Allergy    Chicken pox    Colon polyps    COVID-19    11/06/19 did not have mab infusion   Diabetes mellitus without complication ([place])    Hyperlipidemia    Hypertension    Past Surgical History:  Procedure Laterality Date   TONSILLECTOMY     Family History  Problem Relation Age of Onset   Cancer Mother        bone, liver, lung former smoker ? lung died May 20, 2008   Diabetes Mother    Hypertension Mother    Atrial fibrillation Sister    Social History   Socioeconomic History   Marital status: Married    Spouse name: Not on file   Number of children: Not on file   Years of education: Not on file   Highest education level: Not on file  Occupational History   Not on file  Tobacco Use   Smoking status: Former    Packs/day: 0.50    Years: 20.00    Pack years: 10.00    Types: Cigarettes    Quit date: 2005-05-20    Years since quitting: 16.0   Smokeless tobacco: Never  Vaping Use   Vaping Use: Never used  Substance and Sexual Activity   Alcohol use: Yes    Alcohol/week: 1.0 standard drink    Types: 1 Standard drinks or equivalent per week    Comment: occasional, maybe once a week   Drug use: No    Sexual activity: Yes    Partners: Female  Other Topics Concern   Not on file  Social History Narrative   Wears selt belt    Safe in relationship    Recently moved from [facility] 2018/19 originally from [facility] dad and sister 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    Retired Occupational hygienist degree    Social Determinants of Radio broadcast assistant Strain: Low Risk    Difficulty of Paying Living Expenses: Not hard at all  Food Insecurity: No Food Insecurity   Worried About Charity fundraiser in the Last Year: Never true   Arboriculturist in the Last Year: Never true  Transportation Needs: No Transportation Needs   Lack of Transportation (Medical): No   Lack of Transportation (Non-Medical): No  Physical Activity: Insufficiently Active   Days of Exercise per Week: 3 days   Minutes of Exercise per Session: 30 min  Stress: No Stress Concern Present   Feeling of Stress : Not at all  Social Connections: Unknown   Frequency of Communication with Friends and Family: Not on file   Frequency of Social Gatherings with Friends and Family:  Not on file   Attends Religious Services: Not on file   Active Member of Clubs or Organizations: Not on file   Attends Club or Organization Meetings: Not on file   Marital Status: Married  Intimate Partner Violence: Not At Risk   Fear of Current or Ex-Partner: No   Emotionally Abused: No   Physically Abused: No   Sexually Abused: No   Current Meds  Medication Sig   albuterol (VENTOLIN HFA) 108 (90 Base) MCG/ACT inhaler Inhale 1-2 puffs into the lungs every 6 (six) hours as needed for wheezing or shortness of breath.   amLODipine (NORVASC) 2.5 MG tablet Take 1 tablet (2.5 mg total) by mouth daily.   atorvastatin (LIPITOR) 20 MG tablet Take 1 tablet (20 mg total) by mouth daily at 6 PM.   cetirizine (ZYRTEC) 10 MG tablet Take 1 tablet (10 mg total) by mouth daily.   cholecalciferol (VITAMIN D3) 25 MCG (1000 UNIT) tablet Take 1,000 Units by mouth daily.    Cinnamon 500 MG TABS Take 1 tablet by mouth daily.   doxycycline (VIBRA-TABS) 100 MG tablet Take 1 tablet (100 mg total) by mouth 2 (two) times daily. With food   fluticasone (FLONASE) 50 MCG/ACT nasal spray Place 2 sprays into both nostrils daily.   glucose blood (FREESTYLE LITE) test strip Use as instructed qd to bid E11.9   Lancets (FREESTYLE) lancets Qd to bid Use as instructed E11.9   Multiple Vitamin (MULTIVITAMIN) tablet Take 1 tablet by mouth daily.   mupirocin ointment (BACTROBAN) 2 % Apply 1 application topically 2 (two) times daily. To scalp wound for 1 week   prazosin (MINIPRESS) 1 MG capsule 1 mg qhs   sertraline (ZOLOFT) 100 MG tablet 100 mg 1.5 tablets daily   sitaGLIPtin (JANUVIA) 100 MG tablet Take 1 tablet (100 mg total) by mouth daily.   sodium chloride (OCEAN) 0.65 % SOLN nasal spray Place 1 spray into both nostrils as needed for congestion.   Allergies  Allergen Reactions   Bactrim [Sulfamethoxazole-Trimethoprim]     Red face    Biofreeze [Menthol (Topical Analgesic)]     Rash    Recent Results (from the past 2160 hour(s))  Urinalysis, Routine w reflex microscopic     Status: None   Collection Time: 12/16/20 10:52 AM  Result Value Ref Range   Color, Urine YELLOW YELLOW   APPearance CLEAR CLEAR   Specific Gravity, Urine 1.015 1.001 - 1.035   pH 5.5 5.0 - 8.0   Glucose, UA NEGATIVE NEGATIVE   Bilirubin Urine NEGATIVE NEGATIVE   Ketones, ur NEGATIVE NEGATIVE   Hgb urine dipstick NEGATIVE NEGATIVE   Protein, ur NEGATIVE NEGATIVE   Nitrite NEGATIVE NEGATIVE   Leukocytes,Ua NEGATIVE NEGATIVE  CBC with Differential/Platelet     Status: Abnormal   Collection Time: 12/16/20 10:52 AM  Result Value Ref Range   WBC 8.2 4.0 - 10.5 K/uL   RBC 4.07 (L) 4.22 - 5.81 Mil/uL   Hemoglobin 12.4 (L) 13.0 - 17.0 g/dL   HCT 36.6 (L) 39.0 - 52.0 %   MCV 89.8 78.0 - 100.0 fl   MCHC 33.9 30.0 - 36.0 g/dL   RDW 13.1 11.5 - 15.5 %   Platelets 237.0 150.0 - 400.0 K/uL    Neutrophils Relative % 67.2 43.0 - 77.0 %   Lymphocytes Relative 23.9 12.0 - 46.0 %   Monocytes Relative 7.7 3.0 - 12.0 %   Eosinophils Relative 0.7 0.0 - 5.0 %   Basophils Relative 0.5 0.0 -  3.0 %   Neutro Abs 5.5 1.4 - 7.7 K/uL   Lymphs Abs 2.0 0.7 - 4.0 K/uL   Monocytes Absolute 0.6 0.1 - 1.0 K/uL   Eosinophils Absolute 0.1 0.0 - 0.7 K/uL   Basophils Absolute 0.0 0.0 - 0.1 K/uL  Lipid panel     Status: None   Collection Time: 12/16/20 10:52 AM  Result Value Ref Range   Cholesterol 129 0 - 200 mg/dL    Comment: ATP III Classification       Desirable:  < 200 mg/dL               Borderline High:  200 - 239 mg/dL          High:  > = 240 mg/dL   Triglycerides 97.0 0.0 - 149.0 mg/dL    Comment: Normal:  <150 mg/dLBorderline High:  150 - 199 mg/dL   HDL 40.90 >39.00 mg/dL   VLDL 19.4 0.0 - 40.0 mg/dL   LDL Cholesterol 69 0 - 99 mg/dL   Total CHOL/HDL Ratio 3     Comment:                Men          Women1/2 Average Risk     3.4          3.3Average Risk          5.0          4.42X Average Risk          9.6          7.13X Average Risk          15.0          11.0                       NonHDL 87.91     Comment: NOTE:  Non-HDL goal should be 30 mg/dL higher than patient's LDL goal (i.e. LDL goal of < 70 mg/dL, would have non-HDL goal of < 100 mg/dL)  Comprehensive metabolic panel     Status: Abnormal   Collection Time: 12/16/20 10:52 AM  Result Value Ref Range   Sodium 137 135 - 145 mEq/L   Potassium 5.0 3.5 - 5.1 mEq/L   Chloride 101 96 - 112 mEq/L   CO2 25 19 - 32 mEq/L   Glucose, Bld 121 (H) 70 - 99 mg/dL   BUN 24 (H) 6 - 23 mg/dL   Creatinine, Ser 1.48 0.40 - 1.50 mg/dL   Total Bilirubin 0.6 0.2 - 1.2 mg/dL   Alkaline Phosphatase 71 39 - 117 U/L   AST 19 0 - 37 U/L   ALT 16 0 - 53 U/L   Total Protein 6.9 6.0 - 8.3 g/dL   Albumin 4.5 3.5 - 5.2 g/dL   GFR 47.92 (L) >60.00 mL/min    Comment: Calculated using the CKD-EPI Creatinine Equation (2021)   Calcium 9.2 8.4 - 10.5 mg/dL  PSA      Status: None   Collection Time: 12/16/20 10:52 AM  Result Value Ref Range   PSA 2.91 0.10 - 4.00 ng/mL    Comment: Test performed using Access Hybritech PSA Assay, a parmagnetic partical, chemiluminecent immunoassay.  Hemoglobin A1c     Status: Abnormal   Collection Time: 12/16/20 10:52 AM  Result Value Ref Range   Hgb A1c MFr Bld 7.2 (H) 4.6 - 6.5 %    Comment: Glycemic Control Guidelines for People with Diabetes:Non Diabetic:  <  6%Goal of Therapy: <7%Additional Action Suggested:  >8%   Vitamin D (25 hydroxy)     Status: None   Collection Time: 12/16/20 10:52 AM  Result Value Ref Range   VITD 48.77 30.00 - 100.00 ng/mL  C-reactive protein     Status: None   Collection Time: 01/06/21  9:28 AM  Result Value Ref Range   CRP <1.0 0.5 - 20.0 mg/dL  Basic Metabolic Panel (BMET)     Status: Abnormal   Collection Time: 01/06/21  9:28 AM  Result Value Ref Range   Sodium 135 135 - 145 mEq/L   Potassium 4.4 3.5 - 5.1 mEq/L   Chloride 102 96 - 112 mEq/L   CO2 24 19 - 32 mEq/L   Glucose, Bld 189 (H) 70 - 99 mg/dL   BUN 26 (H) 6 - 23 mg/dL   Creatinine, Ser 1.53 (H) 0.40 - 1.50 mg/dL   GFR 46.03 (L) >60.00 mL/min    Comment: Calculated using the CKD-EPI Creatinine Equation (2021)   Calcium 9.2 8.4 - 10.5 mg/dL  Sodium, urine, random     Status: None   Collection Time: 01/06/21  9:28 AM  Result Value Ref Range   Sodium, Ur 58 28 - 272 mmol/L  Microalbumin / creatinine urine ratio     Status: None   Collection Time: 01/06/21  9:28 AM  Result Value Ref Range   Microalb, Ur 1.5 0.0 - 1.9 mg/dL   Creatinine,U 57.1 mg/dL   Microalb Creat Ratio 2.7 0.0 - 30.0 mg/g  Sedimentation rate     Status: None   Collection Time: 01/06/21  9:28 AM  Result Value Ref Range   Sed Rate 16 0 - 20 mm/hr  Protein Electrophoresis, Urine Rflx.     Status: None   Collection Time: 01/06/21  9:28 AM  Result Value Ref Range   Protein, Ur 5.9 Not Estab. mg/dL   Albumin ELP, Urine 100.0 %   Alpha-1-Globulin,  U 0.0 %   Alpha-2-Globulin, U 0.0 %   Beta Globulin, U 0.0 %   Gamma Globulin, U 0.0 %   M Component, Ur Not Observed Not Observed %   Please Note: Comment     Comment: Protein electrophoresis scan will follow via computer, mail, or courier delivery.   Protein Electrophoresis, (serum)     Status: None   Collection Time: 01/06/21  9:28 AM  Result Value Ref Range   Total Protein 6.7 6.1 - 8.1 g/dL   Albumin ELP 4.0 3.8 - 4.8 g/dL   Alpha 1 0.3 0.2 - 0.3 g/dL   Alpha 2 0.8 0.5 - 0.9 g/dL   Beta Globulin 0.4 0.4 - 0.6 g/dL   Beta 2 0.3 0.2 - 0.5 g/dL   Gamma Globulin 0.9 0.8 - 1.7 g/dL   SPE Interp.      Comment: Normal Serum Protein Electrophoresis Pattern. No abnormal protein bands (M-protein) detected.   Antinuclear Antib (ANA)     Status: Abnormal   Collection Time: 01/06/21  9:28 AM  Result Value Ref Range   Anti Nuclear Antibody (ANA) POSITIVE (A) NEGATIVE    Comment: ANA IFA is a first line screen for detecting the presence of up to approximately 150 autoantibodies in various autoimmune diseases. A positive ANA IFA result is suggestive of autoimmune disease and reflexes to titer and pattern. Further laboratory testing may be considered if clinically indicated. . For additional information, please refer to http://education.[facility].com/faq/FAQ177 (This link is being provided for informational/ educational purposes only.) .  Anti-nuclear ab-titer (ANA titer)     Status: Abnormal   Collection Time: 01/06/21  9:28 AM  Result Value Ref Range   ANA Titer 1 1:40 (H) titer    Comment: A low level ANA titer may be present in pre-clinical autoimmune diseases and normal individuals.                 Reference Range                 <1:40        Negative                 1:40-1:80    Low Antibody Level                 >1:80        Elevated Antibody Level .    ANA Pattern 1 Nuclear, Homogeneous (A)     Comment: Homogeneous pattern is associated with systemic  lupus erythematosus (SLE), drug-induced lupus and juvenile idiopathic arthritis. . AC-1: Homogeneous . International Consensus on ANA Patterns (https://www.hernandez-brewer.com/)   Glucose, capillary     Status: Abnormal   Collection Time: 01/12/21  1:05 PM  Result Value Ref Range   Glucose-Capillary 135 (H) 70 - 99 mg/dL    Comment: Glucose reference range applies only to samples taken after fasting for at least 8 hours.  CBC     Status: Abnormal   Collection Time: 02/12/21  7:49 AM  Result Value Ref Range   WBC 9.1 4.0 - 10.5 K/uL   RBC 3.96 (L) 4.22 - 5.81 MIL/uL   Hemoglobin 12.3 (L) 13.0 - 17.0 g/dL   HCT 36.1 (L) 39.0 - 52.0 %   MCV 91.2 80.0 - 100.0 fL   MCH 31.1 26.0 - 34.0 pg   MCHC 34.1 30.0 - 36.0 g/dL   RDW 12.2 11.5 - 15.5 %   Platelets 229 150 - 400 K/uL   nRBC 0.0 0.0 - 0.2 %    Comment: Performed at [facility], [address], [place], [place] [address]  Comprehensive metabolic panel     Status: Abnormal   Collection Time: 02/12/21  7:49 AM  Result Value Ref Range   Sodium 135 135 - 145 mmol/L   Potassium 4.6 3.5 - 5.1 mmol/L   Chloride 103 98 - 111 mmol/L   CO2 22 22 - 32 mmol/L   Glucose, Bld 257 (H) 70 - 99 mg/dL    Comment: Glucose reference range applies only to samples taken after fasting for at least 8 hours.   BUN 20 8 - 23 mg/dL   Creatinine, Ser 1.42 (H) 0.61 - 1.24 mg/dL   Calcium 9.1 8.9 - 10.3 mg/dL   Total Protein 6.8 6.5 - 8.1 g/dL   Albumin 3.7 3.5 - 5.0 g/dL   AST 21 15 - 41 U/L   ALT 17 0 - 44 U/L   Alkaline Phosphatase 88 38 - 126 U/L   Total Bilirubin 1.0 0.3 - 1.2 mg/dL   GFR, Estimated 53 (L) >60 mL/min    Comment: (NOTE) Calculated using the CKD-EPI Creatinine Equation (2021)    Anion gap 10 5 - 15    Comment: Performed at [facility], [address] [place], [place] [address]  Troponin I (High Sensitivity)     Status: None   Collection Time: 02/12/21  7:49 AM  Result Value Ref Range   Troponin  I (High Sensitivity) 9 <18 ng/L    Comment: (NOTE)  Elevated high sensitivity troponin I (hsTnI) values and significant  changes across serial measurements may suggest ACS but many other  chronic and acute conditions are known to elevate hsTnI results.  Refer to the "Links" section for chest pain algorithms and additional  guidance. Performed at [facility], [address] [place], [place] [address]    Objective  Body mass index is 24.96 kg/m. Wt Readings from Last 3 Encounters:  02/19/21 169 lb (76.7 kg)  02/12/21 168 lb (76.2 kg)  02/09/21 170 lb (77.1 kg)   Temp Readings from Last 3 Encounters:  02/19/21 97.7 F (36.5 C) (Oral)  02/12/21 98 F (36.7 C) (Oral)  12/17/20 (!) 97.2 F (36.2 C) (Temporal)   BP Readings from Last 3 Encounters:  02/19/21 140/80  02/12/21 120/60  01/14/21 120/80   Pulse Readings from Last 3 Encounters:  02/19/21 87  02/12/21 99  01/14/21 91    Physical Exam Vitals and nursing note reviewed.  Constitutional:      Appearance: Normal appearance. He is well-developed and well-groomed. He is obese.  HENT:     Head: Normocephalic and atraumatic.  Eyes:     Conjunctiva/sclera: Conjunctivae normal.     Pupils: Pupils are equal, round, and reactive to light.  Cardiovascular:     Rate and Rhythm: Normal rate and regular rhythm.     Heart sounds: Normal heart sounds.  Pulmonary:     Effort: Pulmonary effort is normal. No respiratory distress.     Breath sounds: Normal breath sounds.  Abdominal:     Tenderness: There is no abdominal tenderness.  Musculoskeletal:     Lumbar back: Tenderness present. Negative right straight leg raise test and negative left straight leg raise test.  Skin:    General: Skin is warm and moist.       Neurological:     General: No focal deficit present.     Mental Status: He is alert and oriented to person, place, and time. Mental status is at baseline.     Sensory: Sensation is intact.     Motor:  Motor function is intact.     Coordination: Coordination is intact.     Gait: Gait is intact. Gait normal.  Psychiatric:        Attention and Perception: Attention and perception normal.        Mood and Affect: Mood and affect normal.        Speech: Speech normal.        Behavior: Behavior normal. Behavior is cooperative.        Thought Content: Thought content normal.        Cognition and Memory: Cognition and memory normal.        Judgment: Judgment normal.    Assessment  Plan  Encounter for removal of sutures Open wound scalp left parietal- Plan: mupirocin ointment (BACTROBAN) 2 %, doxycycline (VIBRA-TABS) 100 MG tablet  bid x 7 days Rec steri strips   Provider: Dr. Olivia Mackie McLean-Scocuzza-Internal Medicine

## 2021-02-19 NOTE — Patient Instructions (Addendum)
°  Cleanse antibacterial soap 1-2 x per day with bactroban 1-2 x per day then apply steri strips to scalp for up to 7 days

## 2021-02-22 ENCOUNTER — Ambulatory Visit (INDEPENDENT_AMBULATORY_CARE_PROVIDER_SITE_OTHER): Payer: Medicare Other

## 2021-02-22 ENCOUNTER — Other Ambulatory Visit: Payer: Self-pay

## 2021-02-22 DIAGNOSIS — I10 Essential (primary) hypertension: Secondary | ICD-10-CM | POA: Diagnosis not present

## 2021-02-22 DIAGNOSIS — R0609 Other forms of dyspnea: Secondary | ICD-10-CM

## 2021-02-25 LAB — ECHOCARDIOGRAM COMPLETE
AR max vel: 3.16 cm2
AV Area VTI: 3.44 cm2
AV Area mean vel: 2.79 cm2
AV Mean grad: 2 mmHg
AV Peak grad: 3.5 mmHg
Ao pk vel: 0.94 m/s
Area-P 1/2: 3.34 cm2
Calc EF: 64.1 %
MV VTI: 3.54 cm2
S' Lateral: 3.6 cm
Single Plane A2C EF: 68.1 %
Single Plane A4C EF: 59 %

## 2021-03-01 ENCOUNTER — Encounter: Payer: Self-pay | Admitting: Gastroenterology

## 2021-03-01 ENCOUNTER — Other Ambulatory Visit: Payer: Self-pay

## 2021-03-01 ENCOUNTER — Ambulatory Visit (INDEPENDENT_AMBULATORY_CARE_PROVIDER_SITE_OTHER): Payer: Medicare Other | Admitting: Gastroenterology

## 2021-03-01 VITALS — BP 146/73 | HR 88 | Temp 97.6°F | Ht 69.0 in | Wt 169.8 lb

## 2021-03-01 DIAGNOSIS — K639 Disease of intestine, unspecified: Secondary | ICD-10-CM | POA: Diagnosis not present

## 2021-03-01 DIAGNOSIS — Z86010 Personal history of colonic polyps: Secondary | ICD-10-CM

## 2021-03-01 MED ORDER — CLENPIQ 10-3.5-12 MG-GM -GM/160ML PO SOLN: 320.0000 mL | ORAL | 0 refills | Status: DC

## 2021-03-01 NOTE — Progress Notes (Signed)
Primary Care Physician: McLean-Scocuzza, Nino Glow, MD  Primary Gastroenterologist:  Dr. Lucilla Lame  Chief Complaint  Patient presents with   Hx of polyps    HPI: Andrew Gonzalez is a 71 y.o. male here with a history of seeing Dr. Bonna Gains for diarrhea with a positive sick contact.  The patient had a history of having a colonoscopy back in 2017 and and at that time had 2 polyps that were tubular adenomas.  His previous colonoscopy was done at the [facility] in [place].  IMPRESSION: No acute findings in the abdomen or pelvis.   Pancolonic diverticulosis with segmental diverticular disease of the sigmoid. Given segmental thickening would correlate with recent colonoscopy results if available or follow-up colonoscopy as warranted.   Aortic atherosclerosis.  The patient denies any unexplained weight loss fevers chills nausea vomiting black stools or bloody stools.  The patient denies any abdominal pain at the present time.  Past Medical History:  Diagnosis Date   Allergy    Chicken pox    Colon polyps    COVID-19    11/06/19 did not have mab infusion   Diabetes mellitus without complication (HCC)    Hyperlipidemia    Hypertension     Current Outpatient Medications  Medication Sig Dispense Refill   albuterol (VENTOLIN HFA) 108 (90 Base) MCG/ACT inhaler Inhale 1-2 puffs into the lungs every 6 (six) hours as needed for wheezing or shortness of breath. 18 g 0   amLODipine (NORVASC) 2.5 MG tablet Take 1 tablet (2.5 mg total) by mouth daily. 90 tablet 3   atorvastatin (LIPITOR) 20 MG tablet Take 1 tablet (20 mg total) by mouth daily at 6 PM. 90 tablet 3   cetirizine (ZYRTEC) 10 MG tablet Take 1 tablet (10 mg total) by mouth daily. 90 tablet 3   cholecalciferol (VITAMIN D3) 25 MCG (1000 UNIT) tablet Take 1,000 Units by mouth daily.     Cinnamon 500 MG TABS Take 1 tablet by mouth daily.     fluticasone (FLONASE) 50 MCG/ACT nasal spray Place 2 sprays into both nostrils daily. 16 g  11   glucose blood (FREESTYLE LITE) test strip Use as instructed qd to bid E11.9 180 each 12   Lancets (FREESTYLE) lancets Qd to bid Use as instructed E11.9 200 each 3   Multiple Vitamin (MULTIVITAMIN) tablet Take 1 tablet by mouth daily.     mupirocin ointment (BACTROBAN) 2 % Apply 1 application topically 2 (two) times daily. To scalp wound for 1 week 30 g 0   prazosin (MINIPRESS) 1 MG capsule 1 mg qhs     sertraline (ZOLOFT) 100 MG tablet 100 mg 1.5 tablets daily     sitaGLIPtin (JANUVIA) 100 MG tablet Take 1 tablet (100 mg total) by mouth daily. 90 tablet 3   sodium chloride (OCEAN) 0.65 % SOLN nasal spray Place 1 spray into both nostrils as needed for congestion. 30 mL 2   No current facility-administered medications for this visit.    Allergies as of 03/01/2021 - Review Complete 03/01/2021  Allergen Reaction Noted   Bactrim [sulfamethoxazole-trimethoprim]  05/25/2018   Biofreeze [menthol (topical analgesic)]  04/17/2017    ROS:  General: Negative for anorexia, weight loss, fever, chills, fatigue, weakness. ENT: Negative for hoarseness, difficulty swallowing , nasal congestion. CV: Negative for chest pain, angina, palpitations, dyspnea on exertion, peripheral edema.  Respiratory: Negative for dyspnea at rest, dyspnea on exertion, cough, sputum, wheezing.  GI: See history of present illness. GU:  Negative for dysuria,  hematuria, urinary incontinence, urinary frequency, nocturnal urination.  Endo: Negative for unusual weight change.    Physical Examination:   BP (!) 146/73 (BP Location: Left Arm, Patient Position: Sitting, Cuff Size: Large)    Pulse 88    Temp 97.6 F (36.4 C) (Temporal)    Ht 5\' 9"  (1.753 m)    Wt 169 lb 12.8 oz (77 kg)    BMI 25.08 kg/m   General: Well-nourished, well-developed in no acute distress.  Eyes: No icterus. Conjunctivae pink. Lungs: Clear to auscultation bilaterally. Non-labored. Heart: Regular rate and rhythm, no murmurs rubs or gallops.   Abdomen: Bowel sounds are normal, nontender, nondistended, no hepatosplenomegaly or masses, no abdominal bruits or hernia , no rebound or guarding.   Extremities: No lower extremity edema. No clubbing or deformities. Neuro: Alert and oriented x 3.  Grossly intact. Skin: Warm and dry, no jaundice.   Psych: Alert and cooperative, normal mood and affect.  Labs:    Imaging Studies: DG Chest 2 View  Result Date: 02/12/2021 CLINICAL DATA:  Syncope, fall EXAM: CHEST - 2 VIEW COMPARISON:  12/30/2019 FINDINGS: The heart size and mediastinal contours are within normal limits. Both lungs are clear. The visualized skeletal structures are unremarkable. IMPRESSION: No active cardiopulmonary disease. Electronically Signed   By: Elmer Picker M.D.   On: 02/12/2021 11:31   DG Lumbar Spine Complete  Result Date: 02/12/2021 CLINICAL DATA:  Trauma, fall EXAM: LUMBAR SPINE - COMPLETE 4+ VIEW COMPARISON:  08/22/2019 FINDINGS: No recent fracture is seen. There is first-degree anterolisthesis at L5-S1 level. Disc space narrowing is seen at L4-L5 and L5-S1 levels. Degenerative changes are noted, particularly severe in the facet joints at L4-L5 and L5-S1 levels. There is a calcific density seen in the right upper quadrant in the oblique views. Exact location and nature of this calcific density is not clearly evaluated in this study. IMPRESSION: No recent fracture is seen in the lumbar spine. Lumbar spondylosis. There is first-degree anterolisthesis at L5-S1 level. Electronically Signed   By: Elmer Picker M.D.   On: 02/12/2021 11:30   CT HEAD WO CONTRAST (5MM)  Result Date: 02/12/2021 CLINICAL DATA:  [place] striking back of head, head trauma, neck trauma, initial encounter EXAM: CT HEAD WITHOUT CONTRAST CT CERVICAL SPINE WITHOUT CONTRAST TECHNIQUE: Multidetector CT imaging of the head and cervical spine was performed following the standard protocol without intravenous contrast. Multiplanar CT image reconstructions  of the cervical spine were also generated. COMPARISON:  08/22/2019 FINDINGS: CT HEAD FINDINGS Brain: Minimal age-related atrophy. Normal ventricular morphology. No midline shift or mass effect. Otherwise normal appearance of brain parenchyma. No intracranial hemorrhage, mass lesion, or evidence of acute infarction. No extra-axial fluid collections. Vascular: No hyperdense vessels Skull: Intact Sinuses/Orbits: Clear.  Concha bullosa of middle turbinates. Other: N/A CT CERVICAL SPINE FINDINGS Alignment: Normal Skull base and vertebrae: Osseous mineralization normal. Vertebral body heights maintained. Multilevel disc space narrowing and endplate spur formation. Multilevel facet degenerative changes. Encroachment upon cervical neural foramina bilaterally at multiple levels by primarily uncovertebral spurs. No fracture, subluxation, or bone destruction. Soft tissues and spinal canal: Prevertebral soft tissues normal thickness. Atherosclerotic calcifications at carotid bifurcations greater on LEFT. Disc levels: Scattered multilevel endplate spurs and mildly bulging discs Upper chest: Lung apices clear Other: N/A IMPRESSION: No acute intracranial abnormalities. Multilevel degenerative disc and facet disease changes of the cervical spine as above. No acute cervical spine abnormalities. Electronically Signed   By: Lavonia Dana M.D.   On: 02/12/2021 08:24  CT Cervical Spine Wo Contrast  Result Date: 02/12/2021 CLINICAL DATA:  [place] striking back of head, head trauma, neck trauma, initial encounter EXAM: CT HEAD WITHOUT CONTRAST CT CERVICAL SPINE WITHOUT CONTRAST TECHNIQUE: Multidetector CT imaging of the head and cervical spine was performed following the standard protocol without intravenous contrast. Multiplanar CT image reconstructions of the cervical spine were also generated. COMPARISON:  08/22/2019 FINDINGS: CT HEAD FINDINGS Brain: Minimal age-related atrophy. Normal ventricular morphology. No midline shift or mass  effect. Otherwise normal appearance of brain parenchyma. No intracranial hemorrhage, mass lesion, or evidence of acute infarction. No extra-axial fluid collections. Vascular: No hyperdense vessels Skull: Intact Sinuses/Orbits: Clear.  Concha bullosa of middle turbinates. Other: N/A CT CERVICAL SPINE FINDINGS Alignment: Normal Skull base and vertebrae: Osseous mineralization normal. Vertebral body heights maintained. Multilevel disc space narrowing and endplate spur formation. Multilevel facet degenerative changes. Encroachment upon cervical neural foramina bilaterally at multiple levels by primarily uncovertebral spurs. No fracture, subluxation, or bone destruction. Soft tissues and spinal canal: Prevertebral soft tissues normal thickness. Atherosclerotic calcifications at carotid bifurcations greater on LEFT. Disc levels: Scattered multilevel endplate spurs and mildly bulging discs Upper chest: Lung apices clear Other: N/A IMPRESSION: No acute intracranial abnormalities. Multilevel degenerative disc and facet disease changes of the cervical spine as above. No acute cervical spine abnormalities. Electronically Signed   By: Lavonia Dana M.D.   On: 02/12/2021 08:24   DG Shoulder Left  Result Date: 02/12/2021 CLINICAL DATA:  Trauma, fall EXAM: LEFT SHOULDER - 2+ VIEW COMPARISON:  12/17/2020 FINDINGS: No recent fracture or dislocation is seen. There are smooth marginated soft tissue calcifications adjacent to the greater tuberosity of proximal humerus. There is faint linear lucency in the proximal shaft of left humerus which has not changed significantly. IMPRESSION: No recent fracture or dislocation is seen. Soft tissue calcifications adjacent to greater tuberosity of proximal humerus may suggest calcific bursitis or tendinosis. There is faint linear lucency in the proximal shaft of left humerus without definite break in the cortical margins. This may be an incidental benign process such as bone cyst or neoplastic  process. If there is any known history of primary malignancy, further evaluation with radionuclide bone scan may be considered. Electronically Signed   By: Elmer Picker M.D.   On: 02/12/2021 11:27   ECHOCARDIOGRAM COMPLETE  Result Date: 02/25/2021    ECHOCARDIOGRAM REPORT   Patient Name:   Andrew Gonzalez Date of Exam: 02/22/2021 Medical Rec #:  [id number]   Height:       69.0 in Accession #:    [id number]  Weight:       169.0 lb Date of Birth:  [birth year]    BSA:          1.923 m Patient Age:    44 years    BP:           140/80 mmHg Patient Gender: M           HR:           80 bpm. Exam Location:  [facility] Procedure: 2D Echo, Cardiac Doppler, Color Doppler and Strain Analysis Indications:    R06.9 DOE  History:        Patient has no prior history of Echocardiogram examinations.                 CAD, Signs/Symptoms:Dyspnea and Syncope; Risk                 Factors:Hypertension, Sleep Apnea, Dyslipidemia, Diabetes and  Former Smoker.  Sonographer:    Caesar Chestnut RDCS, RVT Referring Phys: [id number] CHRISTOPHER END  Sonographer Comments: Global longitudinal strain was attempted. IMPRESSIONS  1. Left ventricular ejection fraction, by estimation, is 60 to 65%. The left ventricle has normal function. The left ventricle has no regional wall motion abnormalities. Left ventricular diastolic parameters are consistent with Grade I diastolic dysfunction (impaired relaxation). The average left ventricular global longitudinal strain is -15.6 %. The global longitudinal strain is normal.  2. Right ventricular systolic function is normal. The right ventricular size is normal. Tricuspid regurgitation signal is inadequate for assessing PA pressure.  3. The mitral valve is normal in structure. Mild mitral valve regurgitation. No evidence of mitral stenosis.  4. The aortic valve is normal in structure. Aortic valve regurgitation is not visualized. No aortic stenosis is present.  5. The inferior vena cava is normal in  size with greater than 50% respiratory variability, suggesting right atrial pressure of 3 mmHg. FINDINGS  Left Ventricle: Left ventricular ejection fraction, by estimation, is 60 to 65%. The left ventricle has normal function. The left ventricle has no regional wall motion abnormalities. The average left ventricular global longitudinal strain is -15.6 %. The global longitudinal strain is normal. The left ventricular internal cavity size was normal in size. There is no left ventricular hypertrophy. Left ventricular diastolic parameters are consistent with Grade I diastolic dysfunction (impaired relaxation). Right Ventricle: The right ventricular size is normal. No increase in right ventricular wall thickness. Right ventricular systolic function is normal. Tricuspid regurgitation signal is inadequate for assessing PA pressure. Left Atrium: Left atrial size was normal in size. Right Atrium: Right atrial size was normal in size. Pericardium: There is no evidence of pericardial effusion. Mitral Valve: The mitral valve is normal in structure. Mild mitral valve regurgitation. No evidence of mitral valve stenosis. MV peak gradient, 3.7 mmHg. The mean mitral valve gradient is 2.0 mmHg. Tricuspid Valve: The tricuspid valve is normal in structure. Tricuspid valve regurgitation is not demonstrated. No evidence of tricuspid stenosis. Aortic Valve: The aortic valve is normal in structure. Aortic valve regurgitation is not visualized. No aortic stenosis is present. Aortic valve mean gradient measures 2.0 mmHg. Aortic valve peak gradient measures 3.5 mmHg. Aortic valve area, by VTI measures 3.44 cm. Pulmonic Valve: The pulmonic valve was normal in structure. Pulmonic valve regurgitation is not visualized. No evidence of pulmonic stenosis. Aorta: The aortic root is normal in size and structure. Venous: The inferior vena cava is normal in size with greater than 50% respiratory variability, suggesting right atrial pressure of 3 mmHg.  IAS/Shunts: No atrial level shunt detected by color flow Doppler.  LEFT VENTRICLE PLAX 2D LVIDd:         5.30 cm     Diastology LVIDs:         3.60 cm     LV e' medial:    7.89 cm/s LV PW:         0.90 cm     LV E/e' medial:  8.0 LV IVS:        0.70 cm     LV e' lateral:   7.62 cm/s LVOT diam:     2.20 cm     LV E/e' lateral: 8.3 LV SV:         68 LV SV Index:   35          2D Longitudinal Strain LVOT Area:     3.80 cm    2D Strain GLS Avg:     -  15.6 %  LV Volumes (MOD) LV vol d, MOD A2C: 74.6 ml LV vol d, MOD A4C: 83.5 ml LV vol s, MOD A2C: 23.8 ml LV vol s, MOD A4C: 34.2 ml LV SV MOD A2C:     50.8 ml LV SV MOD A4C:     83.5 ml LV SV MOD BP:      51.9 ml RIGHT VENTRICLE            IVC RV Basal diam:  3.10 cm    IVC diam: 1.10 cm RV S prime:     9.11 cm/s TAPSE (M-mode): 2.1 cm LEFT ATRIUM             Index        RIGHT ATRIUM          Index LA diam:        2.60 cm 1.35 cm/m   RA Area:     9.50 cm LA Vol (A2C):   33.4 ml 17.37 ml/m  RA Volume:   18.80 ml 9.77 ml/m LA Vol (A4C):   35.1 ml 18.25 ml/m LA Biplane Vol: 35.2 ml 18.30 ml/m  AORTIC VALVE                    PULMONIC VALVE AV Area (Vmax):    3.16 cm     PV Vmax:       0.82 m/s AV Area (Vmean):   2.79 cm     PV Vmean:      53.100 cm/s AV Area (VTI):     3.44 cm     PV VTI:        0.155 m AV Vmax:           94.20 cm/s   PV Peak grad:  2.7 mmHg AV Vmean:          72.200 cm/s  PV Mean grad:  1.0 mmHg AV VTI:            0.198 m AV Peak Grad:      3.5 mmHg AV Mean Grad:      2.0 mmHg LVOT Vmax:         78.30 cm/s LVOT Vmean:        53.000 cm/s LVOT VTI:          0.179 m LVOT/AV VTI ratio: 0.90  AORTA Ao Root diam: 3.50 cm Ao Asc diam:  3.60 cm MITRAL VALVE MV Area (PHT): 3.34 cm     SHUNTS MV Area VTI:   3.54 cm     Systemic VTI:  0.18 m MV Peak grad:  3.7 mmHg     Systemic Diam: 2.20 cm MV Mean grad:  2.0 mmHg MV Vmax:       0.96 m/s MV Vmean:      60.0 cm/s MV Decel Time: 227 msec MV E velocity: 63.00 cm/s MV A velocity: 100.00 cm/s MV E/A ratio:   0.63 Ida Rogue MD Electronically signed by Ida Rogue MD Signature Date/Time: 02/25/2021/7:04:37 PM    Final     Assessment and Plan:   Andrew Gonzalez is a 71 y.o. y/o male Who comes in today with a history of adenomatous polyps and a CT scan showing some thickening of the colon. The patient has been explained  the findings of the CT scan and with his history of polyps in the past the patient has been recommended to undergo a repeat colonoscopy.  The patient has been explained the plan and agrees with  it.  He will be set up for a screening colonoscopy.     Lucilla Lame, MD. Marval Regal    Note: This dictation was prepared with Dragon dictation along with smaller phrase technology. Any transcriptional errors that result from this process are unintentional.

## 2021-03-01 NOTE — H&P (View-Only) (Signed)
Primary Care Physician: McLean-Scocuzza, Nino Glow, MD  Primary Gastroenterologist:  Dr. Lucilla Lame  Chief Complaint  Patient presents with   Hx of polyps    HPI: Andrew Gonzalez is a 71 y.o. male here with a history of seeing Dr. Bonna Gains for diarrhea with a positive sick contact.  The patient had a history of having a colonoscopy back in 2017 and and at that time had 2 polyps that were tubular adenomas.  His previous colonoscopy was done at the [facility] in [place].  IMPRESSION: No acute findings in the abdomen or pelvis.   Pancolonic diverticulosis with segmental diverticular disease of the sigmoid. Given segmental thickening would correlate with recent colonoscopy results if available or follow-up colonoscopy as warranted.   Aortic atherosclerosis.  The patient denies any unexplained weight loss fevers chills nausea vomiting black stools or bloody stools.  The patient denies any abdominal pain at the present time.  Past Medical History:  Diagnosis Date   Allergy    Chicken pox    Colon polyps    COVID-19    11/06/19 did not have mab infusion   Diabetes mellitus without complication (HCC)    Hyperlipidemia    Hypertension     Current Outpatient Medications  Medication Sig Dispense Refill   albuterol (VENTOLIN HFA) 108 (90 Base) MCG/ACT inhaler Inhale 1-2 puffs into the lungs every 6 (six) hours as needed for wheezing or shortness of breath. 18 g 0   amLODipine (NORVASC) 2.5 MG tablet Take 1 tablet (2.5 mg total) by mouth daily. 90 tablet 3   atorvastatin (LIPITOR) 20 MG tablet Take 1 tablet (20 mg total) by mouth daily at 6 PM. 90 tablet 3   cetirizine (ZYRTEC) 10 MG tablet Take 1 tablet (10 mg total) by mouth daily. 90 tablet 3   cholecalciferol (VITAMIN D3) 25 MCG (1000 UNIT) tablet Take 1,000 Units by mouth daily.     Cinnamon 500 MG TABS Take 1 tablet by mouth daily.     fluticasone (FLONASE) 50 MCG/ACT nasal spray Place 2 sprays into both  nostrils daily. 16 g 11   glucose blood (FREESTYLE LITE) test strip Use as instructed qd to bid E11.9 180 each 12   Lancets (FREESTYLE) lancets Qd to bid Use as instructed E11.9 200 each 3   Multiple Vitamin (MULTIVITAMIN) tablet Take 1 tablet by mouth daily.     mupirocin ointment (BACTROBAN) 2 % Apply 1 application topically 2 (two) times daily. To scalp wound for 1 week 30 g 0   prazosin (MINIPRESS) 1 MG capsule 1 mg qhs     sertraline (ZOLOFT) 100 MG tablet 100 mg 1.5 tablets daily     sitaGLIPtin (JANUVIA) 100 MG tablet Take 1 tablet (100 mg total) by mouth daily. 90 tablet 3   sodium chloride (OCEAN) 0.65 % SOLN nasal spray Place 1 spray into both nostrils as needed for congestion. 30 mL 2   No current facility-administered medications for this visit.    Allergies as of 03/01/2021 - Review Complete 03/01/2021  Allergen Reaction Noted   Bactrim [sulfamethoxazole-trimethoprim]  05/25/2018   Biofreeze [menthol (topical analgesic)]  04/17/2017    ROS:  General: Negative for anorexia, weight loss, fever, chills, fatigue, weakness. ENT: Negative for hoarseness, difficulty swallowing , nasal congestion. CV: Negative for chest pain, angina, palpitations, dyspnea on exertion, peripheral edema.  Respiratory: Negative for dyspnea at rest, dyspnea on exertion, cough, sputum, wheezing.  GI: See history of present illness. GU:  Negative for dysuria,  hematuria, urinary incontinence, urinary frequency, nocturnal urination.  Endo: Negative for unusual weight change.    Physical Examination:   BP (!) 146/73 (BP Location: Left Arm, Patient Position: Sitting, Cuff Size: Large)    Pulse 88    Temp 97.6 F (36.4 C) (Temporal)    Ht 5\' 9"  (1.753 m)    Wt 169 lb 12.8 oz (77 kg)    BMI 25.08 kg/m   General: Well-nourished, well-developed in no acute distress.  Eyes: No icterus. Conjunctivae pink. Lungs: Clear to auscultation bilaterally. Non-labored. Heart: Regular rate and rhythm, no  murmurs rubs or gallops.  Abdomen: Bowel sounds are normal, nontender, nondistended, no hepatosplenomegaly or masses, no abdominal bruits or hernia , no rebound or guarding.   Extremities: No lower extremity edema. No clubbing or deformities. Neuro: Alert and oriented x 3.  Grossly intact. Skin: Warm and dry, no jaundice.   Psych: Alert and cooperative, normal mood and affect.  Labs:    Imaging Studies: DG Chest 2 View  Result Date: 02/12/2021 CLINICAL DATA:  Syncope, fall EXAM: CHEST - 2 VIEW COMPARISON:  12/30/2019 FINDINGS: The heart size and mediastinal contours are within normal limits. Both lungs are clear. The visualized skeletal structures are unremarkable. IMPRESSION: No active cardiopulmonary disease. Electronically Signed   By: Elmer Picker M.D.   On: 02/12/2021 11:31   DG Lumbar Spine Complete  Result Date: 02/12/2021 CLINICAL DATA:  Trauma, fall EXAM: LUMBAR SPINE - COMPLETE 4+ VIEW COMPARISON:  08/22/2019 FINDINGS: No recent fracture is seen. There is first-degree anterolisthesis at L5-S1 level. Disc space narrowing is seen at L4-L5 and L5-S1 levels. Degenerative changes are noted, particularly severe in the facet joints at L4-L5 and L5-S1 levels. There is a calcific density seen in the right upper quadrant in the oblique views. Exact location and nature of this calcific density is not clearly evaluated in this study. IMPRESSION: No recent fracture is seen in the lumbar spine. Lumbar spondylosis. There is first-degree anterolisthesis at L5-S1 level. Electronically Signed   By: Elmer Picker M.D.   On: 02/12/2021 11:30   CT HEAD WO CONTRAST (5MM)  Result Date: 02/12/2021 CLINICAL DATA:  [place] striking back of head, head trauma, neck trauma, initial encounter EXAM: CT HEAD WITHOUT CONTRAST CT CERVICAL SPINE WITHOUT CONTRAST TECHNIQUE: Multidetector CT imaging of the head and cervical spine was performed following the standard protocol without intravenous contrast. Multiplanar  CT image reconstructions of the cervical spine were also generated. COMPARISON:  08/22/2019 FINDINGS: CT HEAD FINDINGS Brain: Minimal age-related atrophy. Normal ventricular morphology. No midline shift or mass effect. Otherwise normal appearance of brain parenchyma. No intracranial hemorrhage, mass lesion, or evidence of acute infarction. No extra-axial fluid collections. Vascular: No hyperdense vessels Skull: Intact Sinuses/Orbits: Clear.  Concha bullosa of middle turbinates. Other: N/A CT CERVICAL SPINE FINDINGS Alignment: Normal Skull base and vertebrae: Osseous mineralization normal. Vertebral body heights maintained. Multilevel disc space narrowing and endplate spur formation. Multilevel facet degenerative changes. Encroachment upon cervical neural foramina bilaterally at multiple levels by primarily uncovertebral spurs. No fracture, subluxation, or bone destruction. Soft tissues and spinal canal: Prevertebral soft tissues normal thickness. Atherosclerotic calcifications at carotid bifurcations greater on LEFT. Disc levels: Scattered multilevel endplate spurs and mildly bulging discs Upper chest: Lung apices clear Other: N/A IMPRESSION: No acute intracranial abnormalities. Multilevel degenerative disc and facet disease changes of the cervical spine as above. No acute cervical spine abnormalities. Electronically Signed   By: Lavonia Dana M.D.   On: 02/12/2021 08:24  CT Cervical Spine Wo Contrast  Result Date: 02/12/2021 CLINICAL DATA:  [place] striking back of head, head trauma, neck trauma, initial encounter EXAM: CT HEAD WITHOUT CONTRAST CT CERVICAL SPINE WITHOUT CONTRAST TECHNIQUE: Multidetector CT imaging of the head and cervical spine was performed following the standard protocol without intravenous contrast. Multiplanar CT image reconstructions of the cervical spine were also generated. COMPARISON:  08/22/2019 FINDINGS: CT HEAD FINDINGS Brain: Minimal age-related atrophy. Normal ventricular morphology. No  midline shift or mass effect. Otherwise normal appearance of brain parenchyma. No intracranial hemorrhage, mass lesion, or evidence of acute infarction. No extra-axial fluid collections. Vascular: No hyperdense vessels Skull: Intact Sinuses/Orbits: Clear.  Concha bullosa of middle turbinates. Other: N/A CT CERVICAL SPINE FINDINGS Alignment: Normal Skull base and vertebrae: Osseous mineralization normal. Vertebral body heights maintained. Multilevel disc space narrowing and endplate spur formation. Multilevel facet degenerative changes. Encroachment upon cervical neural foramina bilaterally at multiple levels by primarily uncovertebral spurs. No fracture, subluxation, or bone destruction. Soft tissues and spinal canal: Prevertebral soft tissues normal thickness. Atherosclerotic calcifications at carotid bifurcations greater on LEFT. Disc levels: Scattered multilevel endplate spurs and mildly bulging discs Upper chest: Lung apices clear Other: N/A IMPRESSION: No acute intracranial abnormalities. Multilevel degenerative disc and facet disease changes of the cervical spine as above. No acute cervical spine abnormalities. Electronically Signed   By: Lavonia Dana M.D.   On: 02/12/2021 08:24   DG Shoulder Left  Result Date: 02/12/2021 CLINICAL DATA:  Trauma, fall EXAM: LEFT SHOULDER - 2+ VIEW COMPARISON:  12/17/2020 FINDINGS: No recent fracture or dislocation is seen. There are smooth marginated soft tissue calcifications adjacent to the greater tuberosity of proximal humerus. There is faint linear lucency in the proximal shaft of left humerus which has not changed significantly. IMPRESSION: No recent fracture or dislocation is seen. Soft tissue calcifications adjacent to greater tuberosity of proximal humerus may suggest calcific bursitis or tendinosis. There is faint linear lucency in the proximal shaft of left humerus without definite break in the cortical margins. This may be an incidental benign process such as bone  cyst or neoplastic process. If there is any known history of primary malignancy, further evaluation with radionuclide bone scan may be considered. Electronically Signed   By: Elmer Picker M.D.   On: 02/12/2021 11:27   ECHOCARDIOGRAM COMPLETE  Result Date: 02/25/2021    ECHOCARDIOGRAM REPORT   Patient Name:   Andrew Gonzalez Date of Exam: 02/22/2021 Medical Rec #:  [id number]   Height:       69.0 in Accession #:    [id number]  Weight:       169.0 lb Date of Birth:  [birth year]    BSA:          1.923 m Patient Age:    68 years    BP:           140/80 mmHg Patient Gender: M           HR:           80 bpm. Exam Location:  [facility] Procedure: 2D Echo, Cardiac Doppler, Color Doppler and Strain Analysis Indications:    R06.9 DOE  History:        Patient has no prior history of Echocardiogram examinations.                 CAD, Signs/Symptoms:Dyspnea and Syncope; Risk                 Factors:Hypertension, Sleep Apnea, Dyslipidemia, Diabetes and  Former Smoker.  Sonographer:    Caesar Chestnut RDCS, RVT Referring Phys: [id number] CHRISTOPHER END  Sonographer Comments: Global longitudinal strain was attempted. IMPRESSIONS  1. Left ventricular ejection fraction, by estimation, is 60 to 65%. The left ventricle has normal function. The left ventricle has no regional wall motion abnormalities. Left ventricular diastolic parameters are consistent with Grade I diastolic dysfunction (impaired relaxation). The average left ventricular global longitudinal strain is -15.6 %. The global longitudinal strain is normal.  2. Right ventricular systolic function is normal. The right ventricular size is normal. Tricuspid regurgitation signal is inadequate for assessing PA pressure.  3. The mitral valve is normal in structure. Mild mitral valve regurgitation. No evidence of mitral stenosis.  4. The aortic valve is normal in structure. Aortic valve regurgitation is not visualized. No aortic stenosis is present.  5. The inferior vena  cava is normal in size with greater than 50% respiratory variability, suggesting right atrial pressure of 3 mmHg. FINDINGS  Left Ventricle: Left ventricular ejection fraction, by estimation, is 60 to 65%. The left ventricle has normal function. The left ventricle has no regional wall motion abnormalities. The average left ventricular global longitudinal strain is -15.6 %. The global longitudinal strain is normal. The left ventricular internal cavity size was normal in size. There is no left ventricular hypertrophy. Left ventricular diastolic parameters are consistent with Grade I diastolic dysfunction (impaired relaxation). Right Ventricle: The right ventricular size is normal. No increase in right ventricular wall thickness. Right ventricular systolic function is normal. Tricuspid regurgitation signal is inadequate for assessing PA pressure. Left Atrium: Left atrial size was normal in size. Right Atrium: Right atrial size was normal in size. Pericardium: There is no evidence of pericardial effusion. Mitral Valve: The mitral valve is normal in structure. Mild mitral valve regurgitation. No evidence of mitral valve stenosis. MV peak gradient, 3.7 mmHg. The mean mitral valve gradient is 2.0 mmHg. Tricuspid Valve: The tricuspid valve is normal in structure. Tricuspid valve regurgitation is not demonstrated. No evidence of tricuspid stenosis. Aortic Valve: The aortic valve is normal in structure. Aortic valve regurgitation is not visualized. No aortic stenosis is present. Aortic valve mean gradient measures 2.0 mmHg. Aortic valve peak gradient measures 3.5 mmHg. Aortic valve area, by VTI measures 3.44 cm. Pulmonic Valve: The pulmonic valve was normal in structure. Pulmonic valve regurgitation is not visualized. No evidence of pulmonic stenosis. Aorta: The aortic root is normal in size and structure. Venous: The inferior vena cava is normal in size with greater than 50% respiratory variability, suggesting right atrial  pressure of 3 mmHg. IAS/Shunts: No atrial level shunt detected by color flow Doppler.  LEFT VENTRICLE PLAX 2D LVIDd:         5.30 cm     Diastology LVIDs:         3.60 cm     LV e' medial:    7.89 cm/s LV PW:         0.90 cm     LV E/e' medial:  8.0 LV IVS:        0.70 cm     LV e' lateral:   7.62 cm/s LVOT diam:     2.20 cm     LV E/e' lateral: 8.3 LV SV:         68 LV SV Index:   35          2D Longitudinal Strain LVOT Area:     3.80 cm    2D Strain GLS Avg:     -  15.6 %  LV Volumes (MOD) LV vol d, MOD A2C: 74.6 ml LV vol d, MOD A4C: 83.5 ml LV vol s, MOD A2C: 23.8 ml LV vol s, MOD A4C: 34.2 ml LV SV MOD A2C:     50.8 ml LV SV MOD A4C:     83.5 ml LV SV MOD BP:      51.9 ml RIGHT VENTRICLE            IVC RV Basal diam:  3.10 cm    IVC diam: 1.10 cm RV S prime:     9.11 cm/s TAPSE (M-mode): 2.1 cm LEFT ATRIUM             Index        RIGHT ATRIUM          Index LA diam:        2.60 cm 1.35 cm/m   RA Area:     9.50 cm LA Vol (A2C):   33.4 ml 17.37 ml/m  RA Volume:   18.80 ml 9.77 ml/m LA Vol (A4C):   35.1 ml 18.25 ml/m LA Biplane Vol: 35.2 ml 18.30 ml/m  AORTIC VALVE                    PULMONIC VALVE AV Area (Vmax):    3.16 cm     PV Vmax:       0.82 m/s AV Area (Vmean):   2.79 cm     PV Vmean:      53.100 cm/s AV Area (VTI):     3.44 cm     PV VTI:        0.155 m AV Vmax:           94.20 cm/s   PV Peak grad:  2.7 mmHg AV Vmean:          72.200 cm/s  PV Mean grad:  1.0 mmHg AV VTI:            0.198 m AV Peak Grad:      3.5 mmHg AV Mean Grad:      2.0 mmHg LVOT Vmax:         78.30 cm/s LVOT Vmean:        53.000 cm/s LVOT VTI:          0.179 m LVOT/AV VTI ratio: 0.90  AORTA Ao Root diam: 3.50 cm Ao Asc diam:  3.60 cm MITRAL VALVE MV Area (PHT): 3.34 cm     SHUNTS MV Area VTI:   3.54 cm     Systemic VTI:  0.18 m MV Peak grad:  3.7 mmHg     Systemic Diam: 2.20 cm MV Mean grad:  2.0 mmHg MV Vmax:       0.96 m/s MV Vmean:      60.0 cm/s MV Decel Time: 227 msec MV E velocity: 63.00 cm/s MV A velocity: 100.00  cm/s MV E/A ratio:  0.63 Ida Rogue MD Electronically signed by Ida Rogue MD Signature Date/Time: 02/25/2021/7:04:37 PM    Final     Assessment and Plan:   Andrew Gonzalez is a 71 y.o. y/o male Who comes in today with a history of adenomatous polyps and a CT scan showing some thickening of the colon. The patient has been explained  the findings of the CT scan and with his history of polyps in the past the patient has been recommended to undergo a repeat colonoscopy.  The patient has been explained the plan and agrees with  it.  He will be set up for a screening colonoscopy.     Lucilla Lame, MD. Marval Regal    Note: This dictation was prepared with Dragon dictation along with smaller phrase technology. Any transcriptional errors that result from this process are unintentional.

## 2021-03-03 ENCOUNTER — Encounter: Payer: Self-pay | Admitting: Gastroenterology

## 2021-03-08 ENCOUNTER — Telehealth: Payer: Self-pay | Admitting: Internal Medicine

## 2021-03-08 NOTE — Telephone Encounter (Signed)
The patient called stating his AVS had his wife's information on it and that it needs to be corrected for DOS 02/19/2021. He is asking that the document be corrected . It shows the patient having menopausal symptoms and other numerous things that are not associated with the patient.

## 2021-03-09 DIAGNOSIS — I2510 Atherosclerotic heart disease of native coronary artery without angina pectoris: Secondary | ICD-10-CM | POA: Diagnosis not present

## 2021-03-09 DIAGNOSIS — I2584 Coronary atherosclerosis due to calcified coronary lesion: Secondary | ICD-10-CM | POA: Diagnosis not present

## 2021-03-09 DIAGNOSIS — R911 Solitary pulmonary nodule: Secondary | ICD-10-CM | POA: Diagnosis not present

## 2021-03-10 NOTE — Telephone Encounter (Signed)
I can take off but I advised him at his visit I was doing this to give this information to his wife

## 2021-03-10 NOTE — Telephone Encounter (Signed)
I am not sure how we fix this. Can we go in and addend the AVS in the 1/13 encounter.

## 2021-03-15 ENCOUNTER — Ambulatory Visit: Payer: Medicare Other | Admitting: Anesthesiology

## 2021-03-15 ENCOUNTER — Encounter: Payer: Self-pay | Admitting: Gastroenterology

## 2021-03-15 ENCOUNTER — Ambulatory Visit
Admission: RE | Admit: 2021-03-15 | Discharge: 2021-03-15 | Disposition: A | Discharge status: Home / Self Care | Payer: Medicare Other | Attending: Gastroenterology | Admitting: Gastroenterology

## 2021-03-15 ENCOUNTER — Encounter
Admission: RE | Disposition: A | Discharge status: Home / Self Care | Payer: Self-pay | Source: Home / Self Care | Attending: Gastroenterology

## 2021-03-15 ENCOUNTER — Other Ambulatory Visit: Payer: Self-pay

## 2021-03-15 DIAGNOSIS — K5730 Diverticulosis of large intestine without perforation or abscess without bleeding: Secondary | ICD-10-CM | POA: Diagnosis not present

## 2021-03-15 DIAGNOSIS — Z1211 Encounter for screening for malignant neoplasm of colon: Secondary | ICD-10-CM | POA: Insufficient documentation

## 2021-03-15 DIAGNOSIS — G4730 Sleep apnea, unspecified: Secondary | ICD-10-CM | POA: Diagnosis not present

## 2021-03-15 DIAGNOSIS — Z87891 Personal history of nicotine dependence: Secondary | ICD-10-CM | POA: Diagnosis not present

## 2021-03-15 DIAGNOSIS — Z860100 Personal history of colon polyps, unspecified: Secondary | ICD-10-CM

## 2021-03-15 DIAGNOSIS — Z8616 Personal history of COVID-19: Secondary | ICD-10-CM | POA: Diagnosis not present

## 2021-03-15 DIAGNOSIS — I10 Essential (primary) hypertension: Secondary | ICD-10-CM | POA: Diagnosis not present

## 2021-03-15 DIAGNOSIS — Z7984 Long term (current) use of oral hypoglycemic drugs: Secondary | ICD-10-CM | POA: Diagnosis not present

## 2021-03-15 DIAGNOSIS — J449 Chronic obstructive pulmonary disease, unspecified: Secondary | ICD-10-CM | POA: Insufficient documentation

## 2021-03-15 DIAGNOSIS — I700 Atherosclerosis of aorta: Secondary | ICD-10-CM | POA: Insufficient documentation

## 2021-03-15 DIAGNOSIS — E785 Hyperlipidemia, unspecified: Secondary | ICD-10-CM | POA: Insufficient documentation

## 2021-03-15 DIAGNOSIS — E119 Type 2 diabetes mellitus without complications: Secondary | ICD-10-CM | POA: Insufficient documentation

## 2021-03-15 DIAGNOSIS — Z79899 Other long term (current) drug therapy: Secondary | ICD-10-CM | POA: Diagnosis not present

## 2021-03-15 HISTORY — DX: Polyneuropathy, unspecified: G62.9

## 2021-03-15 HISTORY — DX: Post-traumatic stress disorder, unspecified: F43.10

## 2021-03-15 HISTORY — DX: Sleep apnea, unspecified: G47.30

## 2021-03-15 HISTORY — PX: COLONOSCOPY WITH PROPOFOL: SHX5780

## 2021-03-15 HISTORY — DX: Chronic obstructive pulmonary disease, unspecified: J44.9

## 2021-03-15 LAB — GLUCOSE, CAPILLARY: Glucose-Capillary: 171 mg/dL — ABNORMAL HIGH (ref 70–99)

## 2021-03-15 SURGERY — COLONOSCOPY WITH PROPOFOL
Anesthesia: General | Site: Rectum

## 2021-03-15 MED ORDER — SODIUM CHLORIDE 0.9 % IV SOLN: INTRAVENOUS | Status: DC

## 2021-03-15 MED ORDER — LACTATED RINGERS IV SOLN: INTRAVENOUS | Status: DC

## 2021-03-15 MED ORDER — STERILE WATER FOR IRRIGATION IR SOLN
Status: DC | PRN
  Administered 2021-03-15: 1

## 2021-03-15 MED ORDER — ACETAMINOPHEN 160 MG/5ML PO SOLN: 325.0000 mg | Freq: Once | ORAL | Status: DC

## 2021-03-15 MED ORDER — LIDOCAINE HCL (CARDIAC) PF 100 MG/5ML IV SOSY
PREFILLED_SYRINGE | INTRAVENOUS | Status: DC | PRN
  Administered 2021-03-15: 30 mg via INTRAVENOUS

## 2021-03-15 MED ORDER — PROPOFOL 10 MG/ML IV BOLUS
INTRAVENOUS | Status: DC | PRN
  Administered 2021-03-15: 20 mg via INTRAVENOUS
  Administered 2021-03-15: 100 mg via INTRAVENOUS
  Administered 2021-03-15: 20 mg via INTRAVENOUS

## 2021-03-15 MED ORDER — ACETAMINOPHEN 325 MG PO TABS: 325.0000 mg | ORAL_TABLET | Freq: Once | ORAL | Status: DC

## 2021-03-15 SURGICAL SUPPLY — 6 items
GOWN CVR UNV OPN BCK APRN NK (MISCELLANEOUS) ×2 IMPLANT
GOWN ISOL THUMB LOOP REG UNIV (MISCELLANEOUS) ×4
KIT PRC NS LF DISP ENDO (KITS) ×1 IMPLANT
KIT PROCEDURE OLYMPUS (KITS) ×2
MANIFOLD NEPTUNE II (INSTRUMENTS) ×2 IMPLANT
WATER STERILE IRR 250ML POUR (IV SOLUTION) ×2 IMPLANT

## 2021-03-15 NOTE — Transfer of Care (Signed)
Immediate Anesthesia Transfer of Care Note  Patient: Andrew Gonzalez  Procedure(s) Performed: COLONOSCOPY WITH PROPOFOL (Rectum)  Patient Location: PACU  Anesthesia Type: General  Level of Consciousness: awake, alert  and patient cooperative  Airway and Oxygen Therapy: Patient Spontanous Breathing and Patient connected to supplemental oxygen  Post-op Assessment: Post-op Vital signs reviewed, Patient's Cardiovascular Status Stable, Respiratory Function Stable, Patent Airway and No signs of Nausea or vomiting  Post-op Vital Signs: Reviewed and stable  Complications: No notable events documented.

## 2021-03-15 NOTE — Anesthesia Preprocedure Evaluation (Signed)
Anesthesia Evaluation  Patient identified by MRN, date of birth, ID band Patient awake    Reviewed: Allergy & Precautions, H&P , NPO status , Patient's Chart, lab work & pertinent test results  Airway Mallampati: II  TM Distance: >3 FB Neck ROM: full    Dental no notable dental hx.    Pulmonary sleep apnea , COPD,  COPD inhaler, former smoker,    Pulmonary exam normal breath sounds clear to auscultation       Cardiovascular hypertension, Normal cardiovascular exam Rhythm:regular Rate:Normal     Neuro/Psych    GI/Hepatic   Endo/Other  diabetes  Renal/GU      Musculoskeletal   Abdominal   Peds  Hematology   Anesthesia Other Findings   Reproductive/Obstetrics                             Anesthesia Physical Anesthesia Plan  ASA: 3  Anesthesia Plan: General   Post-op Pain Management: Minimal or no pain anticipated   Induction: Intravenous  PONV Risk Score and Plan: 2 and Treatment may vary due to age or medical condition, TIVA and Propofol infusion  Airway Management Planned: Natural Airway  Additional Equipment:   Intra-op Plan:   Post-operative Plan:   Informed Consent: I have reviewed the patients History and Physical, chart, labs and discussed the procedure including the risks, benefits and alternatives for the proposed anesthesia with the patient or authorized representative who has indicated his/her understanding and acceptance.     Dental Advisory Given  Plan Discussed with: CRNA  Anesthesia Plan Comments:         Anesthesia Quick Evaluation

## 2021-03-15 NOTE — Interval H&P Note (Signed)
Andrew Gonzalez Andrew Gonzalez., Andrew Gonzalez, [place] [address] Phone:[phone] Fax : [phone]  Primary Care Physician:  Gonzalez, Andrew Glow, Gonzalez Primary Gastroenterologist:  Dr. Allen Gonzalez  Pre-Procedure History & Physical: HPI:  Andrew Gonzalez is a 71 y.o. male is here for an colonoscopy.   Past Medical History:  Diagnosis Date   Allergy    Chicken pox    Colon polyps    COPD (chronic obstructive pulmonary disease) ([place])    COVID-19    11/06/19 did not have mab infusion   Diabetes mellitus without complication (HCC)    Hyperlipidemia    Hypertension    Neuropathy    feet   PTSD (post-traumatic stress disorder)    Sleep apnea     Past Surgical History:  Procedure Laterality Date   TONSILLECTOMY      Prior to Admission medications   Medication Sig Start Date End Date Taking? Authorizing Provider  albuterol (VENTOLIN HFA) 108 (90 Base) MCG/ACT inhaler Inhale 1-2 puffs into the lungs every 6 (six) hours as needed for wheezing or shortness of breath. 01/22/20  Yes Gonzalez, Andrew Glow, Gonzalez  amLODipine (NORVASC) 2.5 MG tablet Take 1 tablet (2.5 mg total) by mouth daily. 06/16/20  Yes Gonzalez, Andrew Glow, Gonzalez  atorvastatin (LIPITOR) 20 MG tablet Take 1 tablet (20 mg total) by mouth daily at 6 PM. 06/16/20  Yes Gonzalez, Andrew Glow, Gonzalez  cetirizine (ZYRTEC) 10 MG tablet Take 1 tablet (10 mg total) by mouth daily. 06/16/20  Yes Gonzalez, Andrew Glow, Gonzalez  cholecalciferol (VITAMIN D3) 25 MCG (1000 UNIT) tablet Take 1,000 Units by mouth daily.   Yes Provider, Historical, Gonzalez  Cinnamon 500 MG TABS Take 1 tablet by mouth daily.   Yes Provider, Historical, Gonzalez  fluticasone (FLONASE) 50 MCG/ACT nasal spray Place 2 sprays into both nostrils daily. 06/16/20  Yes Gonzalez, Andrew Glow, Gonzalez  glucose blood (FREESTYLE LITE) test strip Use as instructed qd to bid E11.9 10/09/19  Yes Gonzalez, Andrew Glow, Gonzalez  Lancets (FREESTYLE) lancets Qd to bid Use as instructed E11.9  10/09/19  Yes Gonzalez, Andrew Glow, Gonzalez  Multiple Vitamin (MULTIVITAMIN) tablet Take 1 tablet by mouth daily.   Yes Provider, Historical, Gonzalez  mupirocin ointment (BACTROBAN) 2 % Apply 1 application topically 2 (two) times daily. To scalp wound for 1 week 02/19/21  Yes Gonzalez, Andrew Glow, Gonzalez  prazosin (MINIPRESS) 1 MG capsule 1 mg qhs 09/15/20  Yes Provider, Historical, Gonzalez  sertraline (ZOLOFT) 100 MG tablet 100 mg 1.5 tablets daily 09/15/20  Yes Provider, Historical, Gonzalez  sitaGLIPtin (JANUVIA) 100 MG tablet Take 1 tablet (100 mg total) by mouth daily. 12/15/20  Yes Gonzalez, Andrew Glow, Gonzalez  Sod Picosulfate-Mag Ox-Cit Acd (CLENPIQ) 10-3.5-12 MG-GM -GM/160ML SOLN Take 320 mLs by mouth as directed. 03/01/21  Yes Andrew Gonzalez  sodium chloride (OCEAN) 0.65 % SOLN nasal spray Place 1 spray into both nostrils as needed for congestion. 12/27/19  Yes Gonzalez, Andrew Glow, Gonzalez    Allergies as of 03/01/2021 - Review Complete 03/01/2021  Allergen Reaction Noted   Bactrim [sulfamethoxazole-trimethoprim]  05/25/2018   Biofreeze [menthol (topical analgesic)]  04/17/2017    Family History  Problem Relation Age of Onset   Cancer Mother        bone, liver, lung former smoker ? lung died 2008/05/19   Diabetes Mother    Hypertension Mother    Atrial fibrillation Sister     Social History   Socioeconomic History   Marital status: Married  Spouse name: Not on file   Number of children: Not on file   Years of education: Not on file   Highest education level: Not on file  Occupational History   Not on file  Tobacco Use   Smoking status: Former    Packs/day: 0.50    Years: 20.00    Pack years: 10.00    Types: Cigarettes    Quit date: 2007    Years since quitting: 16.1   Smokeless tobacco: Never  Vaping Use   Vaping Use: Never used  Substance and Sexual Activity   Alcohol use: Not Currently    Alcohol/week: 1.0 standard drink    Types: 1 Standard drinks or equivalent per week    Comment:  occasional, maybe once a week   Drug use: No   Sexual activity: Yes    Partners: Female  Other Topics Concern   Not on file  Social History Narrative   Wears selt belt    Safe in relationship    Recently moved [facility] 2018/19 originally from [facility] dad and sister 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    Retired Occupational hygienist degree    Social Determinants of Radio broadcast assistant Strain: Low Risk    Difficulty of Paying Living Expenses: Not hard at all  Food Insecurity: No Food Insecurity   Worried About Charity fundraiser in the Last Year: Never true   Arboriculturist in the Last Year: Never true  Transportation Needs: No Transportation Needs   Lack of Transportation (Medical): No   Lack of Transportation (Non-Medical): No  Physical Activity: Insufficiently Active   Days of Exercise per Week: 3 days   Minutes of Exercise per Session: 30 min  Stress: No Stress Concern Present   Feeling of Stress : Not at all  Social Connections: Unknown   Frequency of Communication with Friends and Family: Not on file   Frequency of Social Gatherings with Friends and Family: Not on file   Attends Religious Services: Not on Electrical engineer or Organizations: Not on file   Attends Archivist Meetings: Not on file   Marital Status: Married  Human resources officer Violence: Not At Risk   Fear of Current or Ex-Partner: No   Emotionally Abused: No   Physically Abused: No   Sexually Abused: No    Review of Systems: See HPI, otherwise negative ROS  Physical Exam: BP (!) 168/76    Pulse 86    Temp (!) 97.2 F (36.2 C) (Temporal)    Resp 18    Ht 5\' 9"  (1.753 m)    Wt 75.3 kg    SpO2 100%    BMI 24.51 kg/m  General:   Alert,  pleasant and cooperative in NAD Head:  Normocephalic and atraumatic. Neck:  Supple; no masses or thyromegaly. Lungs:  Clear throughout to auscultation.    Heart:  Regular rate and rhythm. Abdomen:  Soft, nontender and nondistended. Normal bowel  sounds, without guarding, and without rebound.   Neurologic:  Alert and  oriented x4;  grossly normal neurologically.  Impression/Plan: Andrew Gonzalez is here for an colonoscopy to be performed for a history of adenomatous polyps on   Risks, benefits, limitations, and alternatives regarding  colonoscopy have been reviewed with the patient.  Questions have been answered.  All parties agreeable.   Andrew Gonzalez  03/15/2021, 7:34 AM

## 2021-03-15 NOTE — Anesthesia Procedure Notes (Signed)
Procedure Name: MAC Date/Time: 03/15/2021 7:48 AM Performed by: Georga Bora, CRNA Pre-anesthesia Checklist: Patient identified, Emergency Drugs available, Suction available, Patient being monitored and Timeout performed Patient Re-evaluated:Patient Re-evaluated prior to induction Oxygen Delivery Method: Nasal cannula Placement Confirmation: positive ETCO2 and breath sounds checked- equal and bilateral

## 2021-03-15 NOTE — Anesthesia Postprocedure Evaluation (Signed)
Anesthesia Post Note  Patient: Andrew Gonzalez  Procedure(s) Performed: COLONOSCOPY WITH PROPOFOL (Rectum)     Patient location during evaluation: PACU Anesthesia Type: General Level of consciousness: awake and alert and oriented Pain management: satisfactory to patient Vital Signs Assessment: post-procedure vital signs reviewed and stable Respiratory status: spontaneous breathing, nonlabored ventilation and respiratory function stable Cardiovascular status: blood pressure returned to baseline and stable Postop Assessment: Adequate PO intake and No signs of nausea or vomiting Anesthetic complications: no   No notable events documented.  Raliegh Ip

## 2021-03-15 NOTE — Op Note (Addendum)
[facility] Gastroenterology Patient Name: Andrew Gonzalez Procedure Date: 03/15/2021 7:28 AM MRN: [id number] Account #: [id number] Date of Birth: [birth year] Admit Type: Outpatient Age: 71 Room: [facility] OR ROOM 01 Gender: Male Note Status: Finalized Instrument Name: [id number] Procedure:             Colonoscopy Indications:           High risk colon cancer surveillance: Personal history                         of colonic polyps that were adenomatous in 2017 Providers:             Lucilla Lame MD, MD Referring MD:          Nino Glow Mclean-Scocuzza MD, MD (Referring MD) Medicines:             Propofol per Anesthesia Complications:         No immediate complications. Procedure:             Pre-Anesthesia Assessment:                        - Prior to the procedure, a History and Physical was                         performed, and patient medications and allergies were                         reviewed. The patient's tolerance of previous                         anesthesia was also reviewed. The risks and benefits                         of the procedure and the sedation options and risks                         were discussed with the patient. All questions were                         answered, and informed consent was obtained. Prior                         Anticoagulants: The patient has taken no previous                         anticoagulant or antiplatelet agents. ASA Grade                         Assessment: II - A patient with mild systemic disease.                         After reviewing the risks and benefits, the patient                         was deemed in satisfactory condition to undergo the                         procedure.  After obtaining informed consent, the colonoscope was                         passed under direct vision. Throughout the procedure,                         the patient's blood pressure, pulse, and oxygen                          saturations were monitored continuously. The                         Colonoscope was introduced through the anus with the                         intention of advancing to the cecum. The scope was                         advanced to the sigmoid colon before the procedure was                         aborted. Medications were given. The colonoscopy was                         performed without difficulty. The patient tolerated                         the procedure well. The quality of the bowel                         preparation was poor. Findings:      The perianal and digital rectal examinations were normal.      Multiple small-mouthed diverticula were found in the sigmoid colon.      A large amount of stool was found in the entire colon, precluding       visualization. Impression:            - Preparation of the colon was poor.                        - Diverticulosis in the sigmoid colon.                        - Stool in the entire examined colon.                        - No specimens collected. Recommendation:        - Discharge patient to home.                        - Resume previous diet.                        - Continue present medications.                        - Repeat colonoscopy because the bowel preparation was                         poor. Procedure Code(s):     ---  Professional ---                        [phone], 53, Colonoscopy, flexible; diagnostic,                         including collection of specimen(s) by brushing or                         washing, when performed (separate procedure) Diagnosis Code(s):     --- Professional ---                        Z86.010, Personal history of colonic polyps CPT copyright 2019 [employer]. All rights reserved. The codes documented in this report are preliminary and upon coder review may  be revised to meet current compliance requirements. Lucilla Lame MD, MD 03/15/2021 7:57:56 AM This report has been signed  electronically. Number of Addenda: 0 Note Initiated On: 03/15/2021 7:28 AM Total Procedure Duration: 0 hours 3 minutes 51 seconds  Estimated Blood Loss:  Estimated blood loss: none.      [facility]

## 2021-03-16 ENCOUNTER — Encounter: Payer: Self-pay | Admitting: Gastroenterology

## 2021-03-16 DIAGNOSIS — E1140 Type 2 diabetes mellitus with diabetic neuropathy, unspecified: Secondary | ICD-10-CM | POA: Diagnosis not present

## 2021-03-16 DIAGNOSIS — B351 Tinea unguium: Secondary | ICD-10-CM | POA: Diagnosis not present

## 2021-03-18 ENCOUNTER — Encounter: Payer: Self-pay | Admitting: Internal Medicine

## 2021-03-18 ENCOUNTER — Other Ambulatory Visit: Payer: Self-pay

## 2021-03-18 ENCOUNTER — Ambulatory Visit (INDEPENDENT_AMBULATORY_CARE_PROVIDER_SITE_OTHER): Payer: Medicare Other | Admitting: Internal Medicine

## 2021-03-18 VITALS — BP 128/64 | HR 85 | Temp 97.7°F | Ht 69.0 in | Wt 175.2 lb

## 2021-03-18 DIAGNOSIS — F515 Nightmare disorder: Secondary | ICD-10-CM | POA: Diagnosis not present

## 2021-03-18 DIAGNOSIS — F4312 Post-traumatic stress disorder, chronic: Secondary | ICD-10-CM

## 2021-03-18 DIAGNOSIS — E119 Type 2 diabetes mellitus without complications: Secondary | ICD-10-CM

## 2021-03-18 DIAGNOSIS — F419 Anxiety disorder, unspecified: Secondary | ICD-10-CM

## 2021-03-18 DIAGNOSIS — R0981 Nasal congestion: Secondary | ICD-10-CM | POA: Diagnosis not present

## 2021-03-18 DIAGNOSIS — I10 Essential (primary) hypertension: Secondary | ICD-10-CM

## 2021-03-18 DIAGNOSIS — J309 Allergic rhinitis, unspecified: Secondary | ICD-10-CM | POA: Diagnosis not present

## 2021-03-18 DIAGNOSIS — J40 Bronchitis, not specified as acute or chronic: Secondary | ICD-10-CM

## 2021-03-18 LAB — POCT GLYCOSYLATED HEMOGLOBIN (HGB A1C): Hemoglobin A1C: 8.2 % — AB (ref 4.0–5.6)

## 2021-03-18 MED ORDER — FLUTICASONE PROPIONATE 50 MCG/ACT NA SUSP: 2.0000 | Freq: Every day | NASAL | 11 refills | Status: AC

## 2021-03-18 MED ORDER — CETIRIZINE HCL 10 MG PO TABS: 10.0000 mg | ORAL_TABLET | Freq: Every day | ORAL | 3 refills | Status: AC

## 2021-03-18 MED ORDER — AMLODIPINE BESYLATE 2.5 MG PO TABS: 2.5000 mg | ORAL_TABLET | Freq: Every day | ORAL | 3 refills | Status: AC

## 2021-03-18 MED ORDER — ATORVASTATIN CALCIUM 20 MG PO TABS: 20.0000 mg | ORAL_TABLET | Freq: Every day | ORAL | 3 refills | Status: AC

## 2021-03-18 NOTE — Progress Notes (Addendum)
Chief Complaint  Patient presents with   Follow-up   F/u  1. DM2 on januvia 100 mg qd previously stopped metformin due to elevated creatinine reduced GFR  2. Scalp laceration doing well s/p staple removal  No more falls to report  3. Anxiety/ptsd, nightmares will be doing last session with therapy via [facility] today he declines to do group therapy for now on prazosin, zoloft   Review of Systems  Constitutional:  Negative for weight loss.  HENT:  Negative for hearing loss.   Eyes:  Negative for blurred vision.  Respiratory:  Negative for shortness of breath.   Cardiovascular:  Negative for chest pain.  Gastrointestinal:  Negative for abdominal pain and blood in stool.  Musculoskeletal:  Negative for back pain.  Skin:  Negative for rash.  Neurological:  Negative for headaches.  Psychiatric/Behavioral:  Negative for depression.   Past Medical History:  Diagnosis Date   Allergy    Chicken pox    Colon polyps    COPD (chronic obstructive pulmonary disease) ([place])    COVID-19    11/06/19 did not have mab infusion   Diabetes mellitus without complication (HCC)    Hyperlipidemia    Hypertension    Neuropathy    feet   PTSD (post-traumatic stress disorder)    Sleep apnea    Past Surgical History:  Procedure Laterality Date   COLONOSCOPY WITH PROPOFOL N/A 03/15/2021   Procedure: COLONOSCOPY WITH PROPOFOL;  Surgeon: Lucilla Lame, MD;  Location: [facility];  Service: Endoscopy;  Laterality: N/A;  Diabetic   TONSILLECTOMY     Family History  Problem Relation Age of Onset   Cancer Mother        bone, liver, lung former smoker ? lung died May 14, 2008   Diabetes Mother    Hypertension Mother    Atrial fibrillation Sister    Social History   Socioeconomic History   Marital status: Married    Spouse name: Not on file   Number of children: Not on file   Years of education: Not on file   Highest education level: Not on file  Occupational History   Not on file  Tobacco Use   Smoking  status: Former    Packs/day: 0.50    Years: 20.00    Pack years: 10.00    Types: Cigarettes    Quit date: 05-14-2005    Years since quitting: 16.1   Smokeless tobacco: Never  Vaping Use   Vaping Use: Never used  Substance and Sexual Activity   Alcohol use: Not Currently    Alcohol/week: 1.0 standard drink    Types: 1 Standard drinks or equivalent per week    Comment: occasional, maybe once a week   Drug use: No   Sexual activity: Yes    Partners: Female  Other Topics Concern   Not on file  Social History Narrative   Wears selt belt    Safe in relationship    Recently moved from [facility] 2018/19 originally from [facility] dad and sister 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    Retired Occupational hygienist degree    Social Determinants of Radio broadcast assistant Strain: Low Risk    Difficulty of Paying Living Expenses: Not hard at all  Food Insecurity: No Food Insecurity   Worried About Charity fundraiser in the Last Year: Never true   Arboriculturist in the Last Year: Never true  Transportation Needs: No Transportation Needs   Lack of  Transportation (Medical): No   Lack of Transportation (Non-Medical): No  Physical Activity: Insufficiently Active   Days of Exercise per Week: 3 days   Minutes of Exercise per Session: 30 min  Stress: No Stress Concern Present   Feeling of Stress : Not at all  Social Connections: Unknown   Frequency of Communication with Friends and Family: Not on file   Frequency of Social Gatherings with Friends and Family: Not on file   Attends Religious Services: Not on Electrical engineer or Organizations: Not on file   Attends Archivist Meetings: Not on file   Marital Status: Married  Human resources officer Violence: Not At Risk   Fear of Current or Ex-Partner: No   Emotionally Abused: No   Physically Abused: No   Sexually Abused: No   Current Meds  Medication Sig   cholecalciferol (VITAMIN D3) 25 MCG (1000 UNIT) tablet Take 1,000 Units by mouth  daily.   Cinnamon 500 MG TABS Take 1 tablet by mouth daily.   glucose blood (FREESTYLE LITE) test strip Use as instructed qd to bid E11.9   Lancets (FREESTYLE) lancets Qd to bid Use as instructed E11.9   Multiple Vitamin (MULTIVITAMIN) tablet Take 1 tablet by mouth daily.   mupirocin ointment (BACTROBAN) 2 % Apply 1 application topically 2 (two) times daily. To scalp wound for 1 week   prazosin (MINIPRESS) 1 MG capsule 1 mg qhs   sertraline (ZOLOFT) 100 MG tablet 100 mg 1.5 tablets daily   sitaGLIPtin (JANUVIA) 100 MG tablet Take 1 tablet (100 mg total) by mouth daily.   Sod Picosulfate-Mag Ox-Cit Acd (CLENPIQ) 10-3.5-12 MG-GM -GM/160ML SOLN Take 320 mLs by mouth as directed.   sodium chloride (OCEAN) 0.65 % SOLN nasal spray Place 1 spray into both nostrils as needed for congestion.   [DISCONTINUED] albuterol (VENTOLIN HFA) 108 (90 Base) MCG/ACT inhaler Inhale 1-2 puffs into the lungs every 6 (six) hours as needed for wheezing or shortness of breath.   [DISCONTINUED] amLODipine (NORVASC) 2.5 MG tablet Take 1 tablet (2.5 mg total) by mouth daily.   [DISCONTINUED] atorvastatin (LIPITOR) 20 MG tablet Take 1 tablet (20 mg total) by mouth daily at 6 PM.   [DISCONTINUED] cetirizine (ZYRTEC) 10 MG tablet Take 1 tablet (10 mg total) by mouth daily.   [DISCONTINUED] fluticasone (FLONASE) 50 MCG/ACT nasal spray Place 2 sprays into both nostrils daily.   Allergies  Allergen Reactions   Bactrim [Sulfamethoxazole-Trimethoprim]     Red face    Biofreeze [Menthol (Topical Analgesic)]     Rash    Recent Results (from the past 2160 hour(s))  C-reactive protein     Status: None   Collection Time: 01/06/21  9:28 AM  Result Value Ref Range   CRP <1.0 0.5 - 20.0 mg/dL  Basic Metabolic Panel (BMET)     Status: Abnormal   Collection Time: 01/06/21  9:28 AM  Result Value Ref Range   Sodium 135 135 - 145 mEq/L   Potassium 4.4 3.5 - 5.1 mEq/L   Chloride 102 96 - 112 mEq/L   CO2 24 19 - 32 mEq/L    Glucose, Bld 189 (H) 70 - 99 mg/dL   BUN 26 (H) 6 - 23 mg/dL   Creatinine, Ser 1.53 (H) 0.40 - 1.50 mg/dL   GFR 46.03 (L) >60.00 mL/min    Comment: Calculated using the CKD-EPI Creatinine Equation (2021)   Calcium 9.2 8.4 - 10.5 mg/dL  Sodium, urine, random  Status: None   Collection Time: 01/06/21  9:28 AM  Result Value Ref Range   Sodium, Ur 58 28 - 272 mmol/L  Microalbumin / creatinine urine ratio     Status: None   Collection Time: 01/06/21  9:28 AM  Result Value Ref Range   Microalb, Ur 1.5 0.0 - 1.9 mg/dL   Creatinine,U 57.1 mg/dL   Microalb Creat Ratio 2.7 0.0 - 30.0 mg/g  Sedimentation rate     Status: None   Collection Time: 01/06/21  9:28 AM  Result Value Ref Range   Sed Rate 16 0 - 20 mm/hr  Protein Electrophoresis, Urine Rflx.     Status: None   Collection Time: 01/06/21  9:28 AM  Result Value Ref Range   Protein, Ur 5.9 Not Estab. mg/dL   Albumin ELP, Urine 100.0 %   Alpha-1-Globulin, U 0.0 %   Alpha-2-Globulin, U 0.0 %   Beta Globulin, U 0.0 %   Gamma Globulin, U 0.0 %   M Component, Ur Not Observed Not Observed %   Please Note: Comment     Comment: Protein electrophoresis scan will follow via computer, mail, or courier delivery.   Protein Electrophoresis, (serum)     Status: None   Collection Time: 01/06/21  9:28 AM  Result Value Ref Range   Total Protein 6.7 6.1 - 8.1 g/dL   Albumin ELP 4.0 3.8 - 4.8 g/dL   Alpha 1 0.3 0.2 - 0.3 g/dL   Alpha 2 0.8 0.5 - 0.9 g/dL   Beta Globulin 0.4 0.4 - 0.6 g/dL   Beta 2 0.3 0.2 - 0.5 g/dL   Gamma Globulin 0.9 0.8 - 1.7 g/dL   SPE Interp.      Comment: Normal Serum Protein Electrophoresis Pattern. No abnormal protein bands (M-protein) detected.   Antinuclear Antib (ANA)     Status: Abnormal   Collection Time: 01/06/21  9:28 AM  Result Value Ref Range   Anti Nuclear Antibody (ANA) POSITIVE (A) NEGATIVE    Comment: ANA IFA is a first line screen for detecting the presence of up to approximately 150 autoantibodies  in various autoimmune diseases. A positive ANA IFA result is suggestive of autoimmune disease and reflexes to titer and pattern. Further laboratory testing may be considered if clinically indicated. . For additional information, please refer to http://education.[facility].com/faq/FAQ177 (This link is being provided for informational/ educational purposes only.) .   Anti-nuclear ab-titer (ANA titer)     Status: Abnormal   Collection Time: 01/06/21  9:28 AM  Result Value Ref Range   ANA Titer 1 1:40 (H) titer    Comment: A low level ANA titer may be present in pre-clinical autoimmune diseases and normal individuals.                 Reference Range                 <1:40        Negative                 1:40-1:80    Low Antibody Level                 >1:80        Elevated Antibody Level .    ANA Pattern 1 Nuclear, Homogeneous (A)     Comment: Homogeneous pattern is associated with systemic lupus erythematosus (SLE), drug-induced lupus and juvenile idiopathic arthritis. . AC-1: Homogeneous . International Consensus on ANA Patterns (https://www.hernandez-brewer.com/)   Glucose, capillary  Status: Abnormal   Collection Time: 01/12/21  1:05 PM  Result Value Ref Range   Glucose-Capillary 135 (H) 70 - 99 mg/dL    Comment: Glucose reference range applies only to samples taken after fasting for at least 8 hours.  CBC     Status: Abnormal   Collection Time: 02/12/21  7:49 AM  Result Value Ref Range   WBC 9.1 4.0 - 10.5 K/uL   RBC 3.96 (L) 4.22 - 5.81 MIL/uL   Hemoglobin 12.3 (L) 13.0 - 17.0 g/dL   HCT 36.1 (L) 39.0 - 52.0 %   MCV 91.2 80.0 - 100.0 fL   MCH 31.1 26.0 - 34.0 pg   MCHC 34.1 30.0 - 36.0 g/dL   RDW 12.2 11.5 - 15.5 %   Platelets 229 150 - 400 K/uL   nRBC 0.0 0.0 - 0.2 %    Comment: Performed at [facility], [address], [place], [place] [address]  Comprehensive metabolic panel     Status: Abnormal   Collection Time: 02/12/21  7:49 AM   Result Value Ref Range   Sodium 135 135 - 145 mmol/L   Potassium 4.6 3.5 - 5.1 mmol/L   Chloride 103 98 - 111 mmol/L   CO2 22 22 - 32 mmol/L   Glucose, Bld 257 (H) 70 - 99 mg/dL    Comment: Glucose reference range applies only to samples taken after fasting for at least 8 hours.   BUN 20 8 - 23 mg/dL   Creatinine, Ser 1.42 (H) 0.61 - 1.24 mg/dL   Calcium 9.1 8.9 - 10.3 mg/dL   Total Protein 6.8 6.5 - 8.1 g/dL   Albumin 3.7 3.5 - 5.0 g/dL   AST 21 15 - 41 U/L   ALT 17 0 - 44 U/L   Alkaline Phosphatase 88 38 - 126 U/L   Total Bilirubin 1.0 0.3 - 1.2 mg/dL   GFR, Estimated 53 (L) >60 mL/min    Comment: (NOTE) Calculated using the CKD-EPI Creatinine Equation (2021)    Anion gap 10 5 - 15    Comment: Performed at [facility], [place], [place] [address]  Troponin I (High Sensitivity)     Status: None   Collection Time: 02/12/21  7:49 AM  Result Value Ref Range   Troponin I (High Sensitivity) 9 <18 ng/L    Comment: (NOTE) Elevated high sensitivity troponin I (hsTnI) values and significant  changes across serial measurements may suggest ACS but many other  chronic and acute conditions are known to elevate hsTnI results.  Refer to the "Links" section for chest pain algorithms and additional  guidance. Performed at [facility], [address] [place], [place] [address]   ECHOCARDIOGRAM COMPLETE     Status: None   Collection Time: 02/22/21  9:06 AM  Result Value Ref Range   AR max vel 3.16 cm2   AV Peak grad 3.5 mmHg   Ao pk vel 0.94 m/s   S' Lateral 3.60 cm   Area-P 1/2 3.34 cm2   AV Area VTI 3.44 cm2   AV Mean grad 2.0 mmHg   Single Plane A4C EF 59.0 %   Single Plane A2C EF 68.1 %   Calc EF 64.1 %   AV Area mean vel 2.79 cm2   MV VTI 3.54 cm2  Glucose, capillary     Status: Abnormal   Collection Time: 03/15/21  7:17 AM  Result Value Ref Range   Glucose-Capillary 171 (H) 70 -  99 mg/dL    Comment: Glucose reference range applies only  to samples taken after fasting for at least 8 hours.  POCT glycosylated hemoglobin (Hb A1C)     Status: Abnormal   Collection Time: 03/18/21 11:35 AM  Result Value Ref Range   Hemoglobin A1C 8.2 (A) 4.0 - 5.6 %   HbA1c POC (<> result, manual entry)     HbA1c, POC (prediabetic range)     HbA1c, POC (controlled diabetic range)     Objective  Body mass index is 25.87 kg/m. Wt Readings from Last 3 Encounters:  03/18/21 175 lb 3.2 oz (79.5 kg)  03/15/21 166 lb (75.3 kg)  03/01/21 169 lb 12.8 oz (77 kg)   Temp Readings from Last 3 Encounters:  03/18/21 97.7 F (36.5 C) (Oral)  03/15/21 99 F (37.2 C)  03/01/21 97.6 F (36.4 C) (Temporal)   BP Readings from Last 3 Encounters:  03/18/21 128/64  03/15/21 129/65  03/01/21 (!) 146/73   Pulse Readings from Last 3 Encounters:  03/18/21 85  03/15/21 82  03/01/21 88    Physical Exam Vitals and nursing note reviewed.  Constitutional:      Appearance: Normal appearance. He is well-developed and well-groomed.  HENT:     Head: Normocephalic and atraumatic.  Eyes:     Conjunctiva/sclera: Conjunctivae normal.     Pupils: Pupils are equal, round, and reactive to light.  Cardiovascular:     Rate and Rhythm: Normal rate and regular rhythm.     Heart sounds: Normal heart sounds.  Pulmonary:     Effort: Pulmonary effort is normal. No respiratory distress.     Breath sounds: Normal breath sounds.  Abdominal:     Tenderness: There is no abdominal tenderness.  Skin:    General: Skin is warm and moist.  Neurological:     General: No focal deficit present.     Mental Status: He is alert and oriented to person, place, and time. Mental status is at baseline.     Sensory: Sensation is intact.     Motor: Motor function is intact.     Coordination: Coordination is intact.     Gait: Gait is intact. Gait normal.  Psychiatric:        Attention and Perception: Attention and perception normal.        Mood and Affect: Mood and affect normal.         Speech: Speech normal.        Behavior: Behavior normal. Behavior is cooperative.        Thought Content: Thought content normal.        Cognition and Memory: Cognition and memory normal.        Judgment: Judgment normal.    Assessment  Plan  Type 2 diabetes mellitus without complication, without long-term current use of insulin (HCC) - Plan: POCT glycosylated hemoglobin (Hb A1C), atorvastatin (LIPITOR) 20 MG tablet On januvia 100 mg qd consider change to rybyelsus 3 mg titrate up off metformin due to kidney function for now  A1c 8.2 today  As of 03/22/21  Dr. Kelton Pillar called me back the [place] will not cover Ozempic 1st you have to try Jardiace which will make you pee out sugar   We discussed together please STOP Januvia 100 mg   Restart metformin 1000 mg 2x per day with food   Add Jardiance he stated he was going to send this end to [place] it comes in 10 and 25 mg he stated he  was going to instruct you take take 12.5 mg daily in the am   You have an appt with him in 06/2021  But in the meanwhile we both want labs after a change in your medication 1 month after changing your medications and I have his fax # to fax him a result of the labs fax # [phone] --Please call here to schedule labs 1 month after medication change   Thank you Anxiety Nightmares Chronic post-traumatic stress disorder (PTSD) Last therapy session today    Allergic rhinitis, unspecified seasonality, unspecified trigger - Plan: cetirizine (ZYRTEC) 10 MG tablet  Essential hypertension - Plan: amLODipine (NORVASC) 2.5 MG tablet Controlled    HM Flu shot utd  Tdap utd  prevnar utd pna 23 vaccine covid vx 2/2 covid 19 + 11/06/19 declines booster had 2/2 shingrix vaccines     DRE in future PSA 12/16/2020 2.91   Colonoscopy 03/15/21 poor prep will be rescheduled Dr. Allen Majewski  colonoscopy had 11/23/15 moderate to severe diverticulosis 2 polyps no pathology report noted Dr. Rexene Alberts [facility]  requested records for pathology report  -rec repeat in 7 years with h/o polyps ? Need pathology per pt told 10 years  No FH colon cancer   Hep C neg    dermatology saw Dr. Kellie Moor tbse multiple nevi due to see again 02/15/2018 saw cyst removal back 03/07/2018 sch currently 02/2020 will see if can seen sooner  02/2019 nl tbse  02/13/20 dermatology ln2 check resolved   sch f/u 03/2021   Eye exam AE 08/13/20 negative    rec healthy diet and exercise    CT chest below 03/09/21  Stable solid 7 mm nodule left lower lobe when compared to the study in June 2022. Etiology remains indeterminate and continued surveillance with repeat CT in 6 months suggested.   Moderate multivessel coronary artery calcifications, as above.  08/17/20 1. Multiple subcentimeter pulmonary nodules. The largest these lesions measures approximately 7 mm and is located in the lower lobe of the left lung (image 29 series 4). Recommend correlation with any prior CT imaging of the chest and/or follow-up chest CT in 4-6 months per the 2017 Fleischner society guidelines (pulmonary nodules detected incidentally in patients >35 years on non-screening CT).   2. Notable incidental findings include:  -Moderate burden of coronary artery calcification  -Small hiatal hernia    [facility] PCP:  Dr Carilyn Goodpasture [facility] at the [facility] number is [phone] Fax [phone]  Provider: Dr. Olivia Mackie McLean-Scocuzza-Internal Medicine

## 2021-03-19 ENCOUNTER — Encounter: Payer: Self-pay | Admitting: Internal Medicine

## 2021-03-22 ENCOUNTER — Encounter: Payer: Self-pay | Admitting: Internal Medicine

## 2021-03-22 ENCOUNTER — Telehealth: Payer: Self-pay | Admitting: Internal Medicine

## 2021-03-22 MED ORDER — METFORMIN HCL 1000 MG PO TABS: 1000.0000 mg | ORAL_TABLET | Freq: Two times a day (BID) | ORAL | 3 refills | Status: DC

## 2021-03-22 MED ORDER — EMPAGLIFLOZIN 10 MG PO TABS: 10.0000 mg | ORAL_TABLET | Freq: Every day | ORAL | Status: DC

## 2021-03-22 NOTE — Telephone Encounter (Signed)
Fyi  03/22/21 11:22 am  Called left message  Dr Carilyn Goodpasture [facility] at the [facility] [phone]   I want to start ozempic 0.25 weekly stop Januvia 100 mg qd due to A1c 03/18/21 8.2      Return # for [facility] PCP my cell #

## 2021-03-22 NOTE — Addendum Note (Signed)
Addended by: Orland Mustard on: 03/22/2021 04:20 PM   Modules accepted: Orders

## 2021-03-24 ENCOUNTER — Ambulatory Visit: Payer: Medicare Other | Admitting: Internal Medicine

## 2021-03-25 DIAGNOSIS — L821 Other seborrheic keratosis: Secondary | ICD-10-CM | POA: Diagnosis not present

## 2021-03-25 DIAGNOSIS — D2261 Melanocytic nevi of right upper limb, including shoulder: Secondary | ICD-10-CM | POA: Diagnosis not present

## 2021-03-25 DIAGNOSIS — D225 Melanocytic nevi of trunk: Secondary | ICD-10-CM | POA: Diagnosis not present

## 2021-03-25 DIAGNOSIS — D2262 Melanocytic nevi of left upper limb, including shoulder: Secondary | ICD-10-CM | POA: Diagnosis not present

## 2021-03-26 NOTE — Telephone Encounter (Signed)
Message below was sent to the Patient by provider on mychart and patient has read this.  McLean-Scocuzza, Nino Glow, MD to Andrew Gonzalez   TM    4:16 PM Hi  Dr. Kelton Pillar called me back the [place] will not cover Ozempic 1st you have to try Jardiace which will make you pee out sugar    We discussed together please STOP Januvia 100 mg    Restart metformin 1000 mg 2x per day with food    Add Jardiance he stated he was going to send this end to [place] it comes in 10 and 25 mg he stated he was going to instruct you take take 12.5 mg daily in the am    You have an appt with him in 06/2021   But in the meanwhile we both want labs after a change in your medication 1 month after changing your medications and I have his fax # to fax him a result of the labs fax # [phone] --Please call here to schedule labs 1 month after medication change    Thank you    Last read by Andrew Gonzalez at  4:26 PM on 03/22/2021.

## 2021-04-04 ENCOUNTER — Encounter: Payer: Self-pay | Admitting: Radiology

## 2021-04-04 ENCOUNTER — Emergency Department: Payer: Medicare Other

## 2021-04-04 ENCOUNTER — Inpatient Hospital Stay
Admission: EM | Admit: 2021-04-04 | Discharge: 2021-04-06 | DRG: 683 | Disposition: A | Discharge status: Home Health | Payer: Medicare Other | Attending: Internal Medicine | Admitting: Internal Medicine

## 2021-04-04 ENCOUNTER — Observation Stay: Payer: Medicare Other

## 2021-04-04 ENCOUNTER — Other Ambulatory Visit: Payer: Self-pay

## 2021-04-04 DIAGNOSIS — Z8616 Personal history of COVID-19: Secondary | ICD-10-CM | POA: Diagnosis not present

## 2021-04-04 DIAGNOSIS — Z833 Family history of diabetes mellitus: Secondary | ICD-10-CM

## 2021-04-04 DIAGNOSIS — G4733 Obstructive sleep apnea (adult) (pediatric): Secondary | ICD-10-CM | POA: Diagnosis present

## 2021-04-04 DIAGNOSIS — Z8249 Family history of ischemic heart disease and other diseases of the circulatory system: Secondary | ICD-10-CM

## 2021-04-04 DIAGNOSIS — Z87891 Personal history of nicotine dependence: Secondary | ICD-10-CM

## 2021-04-04 DIAGNOSIS — E1122 Type 2 diabetes mellitus with diabetic chronic kidney disease: Secondary | ICD-10-CM | POA: Diagnosis present

## 2021-04-04 DIAGNOSIS — Z808 Family history of malignant neoplasm of other organs or systems: Secondary | ICD-10-CM

## 2021-04-04 DIAGNOSIS — Z7984 Long term (current) use of oral hypoglycemic drugs: Secondary | ICD-10-CM

## 2021-04-04 DIAGNOSIS — E785 Hyperlipidemia, unspecified: Secondary | ICD-10-CM | POA: Diagnosis present

## 2021-04-04 DIAGNOSIS — I5032 Chronic diastolic (congestive) heart failure: Secondary | ICD-10-CM | POA: Diagnosis present

## 2021-04-04 DIAGNOSIS — N183 Chronic kidney disease, stage 3 unspecified: Secondary | ICD-10-CM | POA: Diagnosis not present

## 2021-04-04 DIAGNOSIS — G894 Chronic pain syndrome: Secondary | ICD-10-CM | POA: Diagnosis present

## 2021-04-04 DIAGNOSIS — S199XXA Unspecified injury of neck, initial encounter: Secondary | ICD-10-CM | POA: Diagnosis not present

## 2021-04-04 DIAGNOSIS — I130 Hypertensive heart and chronic kidney disease with heart failure and stage 1 through stage 4 chronic kidney disease, or unspecified chronic kidney disease: Secondary | ICD-10-CM | POA: Diagnosis present

## 2021-04-04 DIAGNOSIS — M542 Cervicalgia: Secondary | ICD-10-CM | POA: Diagnosis not present

## 2021-04-04 DIAGNOSIS — I951 Orthostatic hypotension: Secondary | ICD-10-CM | POA: Diagnosis present

## 2021-04-04 DIAGNOSIS — J449 Chronic obstructive pulmonary disease, unspecified: Secondary | ICD-10-CM | POA: Diagnosis not present

## 2021-04-04 DIAGNOSIS — Z79899 Other long term (current) drug therapy: Secondary | ICD-10-CM

## 2021-04-04 DIAGNOSIS — Z9989 Dependence on other enabling machines and devices: Secondary | ICD-10-CM | POA: Diagnosis not present

## 2021-04-04 DIAGNOSIS — G319 Degenerative disease of nervous system, unspecified: Secondary | ICD-10-CM | POA: Diagnosis not present

## 2021-04-04 DIAGNOSIS — F4312 Post-traumatic stress disorder, chronic: Secondary | ICD-10-CM | POA: Diagnosis present

## 2021-04-04 DIAGNOSIS — N1831 Chronic kidney disease, stage 3a: Secondary | ICD-10-CM | POA: Diagnosis not present

## 2021-04-04 DIAGNOSIS — Z801 Family history of malignant neoplasm of trachea, bronchus and lung: Secondary | ICD-10-CM

## 2021-04-04 DIAGNOSIS — M47812 Spondylosis without myelopathy or radiculopathy, cervical region: Secondary | ICD-10-CM | POA: Diagnosis not present

## 2021-04-04 DIAGNOSIS — F32A Depression, unspecified: Secondary | ICD-10-CM | POA: Diagnosis present

## 2021-04-04 DIAGNOSIS — N179 Acute kidney failure, unspecified: Secondary | ICD-10-CM | POA: Diagnosis present

## 2021-04-04 DIAGNOSIS — R531 Weakness: Secondary | ICD-10-CM | POA: Diagnosis not present

## 2021-04-04 DIAGNOSIS — I2510 Atherosclerotic heart disease of native coronary artery without angina pectoris: Secondary | ICD-10-CM | POA: Diagnosis present

## 2021-04-04 DIAGNOSIS — I10 Essential (primary) hypertension: Secondary | ICD-10-CM | POA: Diagnosis present

## 2021-04-04 DIAGNOSIS — F515 Nightmare disorder: Secondary | ICD-10-CM | POA: Diagnosis present

## 2021-04-04 DIAGNOSIS — W19XXXA Unspecified fall, initial encounter: Secondary | ICD-10-CM | POA: Diagnosis not present

## 2021-04-04 DIAGNOSIS — E1129 Type 2 diabetes mellitus with other diabetic kidney complication: Secondary | ICD-10-CM | POA: Diagnosis present

## 2021-04-04 DIAGNOSIS — I700 Atherosclerosis of aorta: Secondary | ICD-10-CM | POA: Diagnosis not present

## 2021-04-04 DIAGNOSIS — S0990XA Unspecified injury of head, initial encounter: Secondary | ICD-10-CM | POA: Diagnosis not present

## 2021-04-04 DIAGNOSIS — I6782 Cerebral ischemia: Secondary | ICD-10-CM | POA: Diagnosis not present

## 2021-04-04 DIAGNOSIS — R55 Syncope and collapse: Secondary | ICD-10-CM | POA: Diagnosis not present

## 2021-04-04 DIAGNOSIS — R110 Nausea: Secondary | ICD-10-CM | POA: Diagnosis not present

## 2021-04-04 LAB — GLUCOSE, CAPILLARY
Glucose-Capillary: 173 mg/dL — ABNORMAL HIGH (ref 70–99)
Glucose-Capillary: 131 mg/dL — ABNORMAL HIGH (ref 70–99)

## 2021-04-04 LAB — COMPREHENSIVE METABOLIC PANEL
ALT: 19 U/L (ref 0–44)
AST: 30 U/L (ref 15–41)
Albumin: 4.3 g/dL (ref 3.5–5.0)
Alkaline Phosphatase: 79 U/L (ref 38–126)
Anion gap: 13 (ref 5–15)
BUN: 33 mg/dL — ABNORMAL HIGH (ref 8–23)
CO2: 21 mmol/L — ABNORMAL LOW (ref 22–32)
Calcium: 9.5 mg/dL (ref 8.9–10.3)
Chloride: 101 mmol/L (ref 98–111)
Creatinine, Ser: 1.74 mg/dL — ABNORMAL HIGH (ref 0.61–1.24)
GFR, Estimated: 42 mL/min — ABNORMAL LOW (ref >60)
Glucose, Bld: 215 mg/dL — ABNORMAL HIGH (ref 70–99)
Potassium: 4.9 mmol/L (ref 3.5–5.1)
Sodium: 135 mmol/L (ref 135–145)
Total Bilirubin: 0.9 mg/dL (ref 0.3–1.2)
Total Protein: 7.5 g/dL (ref 6.5–8.1)

## 2021-04-04 LAB — CBC
HCT: 39 % (ref 39.0–52.0)
Hemoglobin: 13.4 g/dL (ref 13.0–17.0)
MCH: 30.6 pg (ref 26.0–34.0)
MCHC: 34.4 g/dL (ref 30.0–36.0)
MCV: 89 fL (ref 80.0–100.0)
Platelets: 160 10*3/uL (ref 150–400)
RBC: 4.38 MIL/uL (ref 4.22–5.81)
RDW: 11.9 % (ref 11.5–15.5)
WBC: 9.8 10*3/uL (ref 4.0–10.5)
nRBC: 0 % (ref 0.0–0.2)

## 2021-04-04 LAB — BRAIN NATRIURETIC PEPTIDE: B Natriuretic Peptide: 7.6 pg/mL (ref 0.0–100.0)

## 2021-04-04 LAB — URINE DRUG SCREEN, QUALITATIVE (ARMC ONLY)
Amphetamines, Ur Screen: NOT DETECTED
Barbiturates, Ur Screen: NOT DETECTED
Benzodiazepine, Ur Scrn: NOT DETECTED
Cannabinoid 50 Ng, Ur ~~LOC~~: NOT DETECTED
Cocaine Metabolite,Ur ~~LOC~~: NOT DETECTED
MDMA (Ecstasy)Ur Screen: NOT DETECTED
Methadone Scn, Ur: NOT DETECTED
Opiate, Ur Screen: NOT DETECTED
Phencyclidine (PCP) Ur S: NOT DETECTED
Tricyclic, Ur Screen: NOT DETECTED

## 2021-04-04 LAB — HIV ANTIBODY (ROUTINE TESTING W REFLEX): HIV Screen 4th Generation wRfx: NONREACTIVE

## 2021-04-04 LAB — TROPONIN I (HIGH SENSITIVITY)
Troponin I (High Sensitivity): 12 ng/L (ref <18)
Troponin I (High Sensitivity): 12 ng/L (ref <18)

## 2021-04-04 LAB — CBG MONITORING, ED: Glucose-Capillary: 127 mg/dL — ABNORMAL HIGH (ref 70–99)

## 2021-04-04 LAB — RESP PANEL BY RT-PCR (FLU A&B, COVID) ARPGX2
Influenza A by PCR: NEGATIVE
Influenza B by PCR: NEGATIVE
SARS Coronavirus 2 by RT PCR: NEGATIVE

## 2021-04-04 LAB — D-DIMER, QUANTITATIVE: D-Dimer, Quant: 0.33 ug/mL-FEU (ref 0.00–0.50)

## 2021-04-04 MED ORDER — ATORVASTATIN CALCIUM 20 MG PO TABS
20.0000 mg | ORAL_TABLET | Freq: Every day | ORAL | Status: DC
  Administered 2021-04-04 – 2021-04-05 (×2): 20 mg via ORAL
  Filled 2021-04-04 (×2): qty 1

## 2021-04-04 MED ORDER — VITAMIN D 25 MCG (1000 UNIT) PO TABS
1000.0000 [IU] | ORAL_TABLET | Freq: Every day | ORAL | Status: DC
  Administered 2021-04-04 – 2021-04-06 (×3): 1000 [IU] via ORAL
  Filled 2021-04-04 (×3): qty 1

## 2021-04-04 MED ORDER — FLUTICASONE PROPIONATE 50 MCG/ACT NA SUSP
2.0000 | Freq: Every day | NASAL | Status: DC
  Filled 2021-04-04: qty 16

## 2021-04-04 MED ORDER — ENOXAPARIN SODIUM 40 MG/0.4ML IJ SOSY
40.0000 mg | PREFILLED_SYRINGE | INTRAMUSCULAR | Status: DC
  Administered 2021-04-04 – 2021-04-05 (×2): 40 mg via SUBCUTANEOUS
  Filled 2021-04-04 (×2): qty 0.4

## 2021-04-04 MED ORDER — DM-GUAIFENESIN ER 30-600 MG PO TB12: 1.0000 | ORAL_TABLET | Freq: Two times a day (BID) | ORAL | Status: DC | PRN

## 2021-04-04 MED ORDER — SERTRALINE HCL 50 MG PO TABS
150.0000 mg | ORAL_TABLET | Freq: Every day | ORAL | Status: DC
  Administered 2021-04-04 – 2021-04-06 (×3): 150 mg via ORAL
  Filled 2021-04-04 (×4): qty 3

## 2021-04-04 MED ORDER — INSULIN ASPART 100 UNIT/ML IJ SOLN
0.0000 [IU] | Freq: Three times a day (TID) | INTRAMUSCULAR | Status: DC
  Administered 2021-04-04: 1 [IU] via SUBCUTANEOUS
  Administered 2021-04-04 – 2021-04-06 (×6): 2 [IU] via SUBCUTANEOUS
  Filled 2021-04-04 (×7): qty 1

## 2021-04-04 MED ORDER — CINNAMON 500 MG PO TABS: 1.0000 | ORAL_TABLET | Freq: Every day | ORAL | Status: DC

## 2021-04-04 MED ORDER — ALBUTEROL SULFATE (2.5 MG/3ML) 0.083% IN NEBU
2.5000 mg | INHALATION_SOLUTION | RESPIRATORY_TRACT | Status: DC | PRN
Start: 2021-04-04 — End: 2021-04-07

## 2021-04-04 MED ORDER — ADULT MULTIVITAMIN W/MINERALS CH
1.0000 | ORAL_TABLET | Freq: Every day | ORAL | Status: DC
  Administered 2021-04-04 – 2021-04-06 (×3): 1 via ORAL
  Filled 2021-04-04 (×3): qty 1

## 2021-04-04 MED ORDER — ACETAMINOPHEN 325 MG PO TABS
650.0000 mg | ORAL_TABLET | Freq: Four times a day (QID) | ORAL | Status: DC | PRN
Start: 2021-04-04 — End: 2021-04-07

## 2021-04-04 MED ORDER — LORATADINE 10 MG PO TABS
10.0000 mg | ORAL_TABLET | Freq: Every day | ORAL | Status: DC
Start: 2021-04-05 — End: 2021-04-07
  Administered 2021-04-05 – 2021-04-06 (×2): 10 mg via ORAL
  Filled 2021-04-04 (×2): qty 1

## 2021-04-04 MED ORDER — SODIUM CHLORIDE 0.9 % IV BOLUS
1000.0000 mL | Freq: Once | INTRAVENOUS | Status: AC
Start: 2021-04-04 — End: 2021-04-04
  Administered 2021-04-04: 1000 mL via INTRAVENOUS

## 2021-04-04 MED ORDER — AMLODIPINE BESYLATE 5 MG PO TABS
2.5000 mg | ORAL_TABLET | Freq: Every day | ORAL | Status: DC
  Administered 2021-04-04 – 2021-04-06 (×3): 2.5 mg via ORAL
  Filled 2021-04-04 (×3): qty 1

## 2021-04-04 MED ORDER — INSULIN ASPART 100 UNIT/ML IJ SOLN: 0.0000 [IU] | Freq: Every day | INTRAMUSCULAR | Status: DC

## 2021-04-04 MED ORDER — ONDANSETRON HCL 4 MG/2ML IJ SOLN
4.0000 mg | Freq: Three times a day (TID) | INTRAMUSCULAR | Status: DC | PRN
Start: 2021-04-04 — End: 2021-04-07

## 2021-04-04 MED ORDER — HYDRALAZINE HCL 20 MG/ML IJ SOLN: 5.0000 mg | INTRAMUSCULAR | Status: DC | PRN

## 2021-04-04 MED ORDER — SERTRALINE HCL 100 MG PO TABS
1500.0000 mg | ORAL_TABLET | Freq: Every day | ORAL | Status: DC
Start: 2021-04-04 — End: 2021-04-04

## 2021-04-04 NOTE — ED Notes (Signed)
Pt to MRI

## 2021-04-04 NOTE — ED Provider Notes (Signed)
[facility] Provider Note    Event Date/Time   First MD Initiated Contact with Patient 04/04/21 [phone]     (approximate)   History   Fall, Nausea, and Dizziness   HPI  Andrew Gonzalez is a 71 y.o. male with a history of diabetes, hyperlipidemia, hypertension, PTSD chronic pain syndrome presents to the ER after fainting spell that occurred while he was sitting down to go to the bathroom this morning.  Reportedly fell into the wall.  His did go through the drywall.  He denies any LOC is complaining of some neck pain.  Denies any chest pain.  Does have some shortness of breath that she states is chronic and anxiety related.  Denies any pain with deep inspiration.  Denies any numbness or tingling.  States that he did start a new diabetic medication, Jardiance, this week is not on any blood thinners.  States he checked his blood sugar this morning it was 200.     Physical Exam   Triage Vital Signs: ED Triage Vitals  Enc Vitals Group     BP --      Pulse --      Resp --      Temp --      Temp src --      SpO2 04/04/21 0716 100 %     Weight 04/04/21 0718 175 lb (79.4 kg)     Height 04/04/21 0718 5\' 9"  (1.753 m)     Head Circumference --      Peak Flow --      Pain Score --      Pain Loc --      Pain Edu? --      Excl. in [place]? --     Most recent vital signs: Vitals:   04/04/21 0716 04/04/21 0725  BP:  131/72  Pulse:  (!) 102  Resp:  20  Temp:  98.6 F (37 C)  SpO2: 100% 100%     Constitutional: Alert  Eyes: Conjunctivae are normal.  Head: Atraumatic. Nose: No congestion/rhinnorhea. Mouth/Throat: Mucous membranes are moist.   Neck: Painless ROM. No step offs or deformity Cardiovascular:   Good peripheral circulation. Respiratory: Normal respiratory effort.  No retractions.  Gastrointestinal: Soft and nontender.  Musculoskeletal:  no deformity Neurologic:  MAE spontaneously. No gross focal neurologic deficits are appreciated.  Skin:  Skin is  warm, dry and intact. No rash noted. Psychiatric: Mood and affect are normal. Speech and behavior are normal.    ED Results / Procedures / Treatments   Labs (all labs ordered are listed, but only abnormal results are displayed) Labs Reviewed  COMPREHENSIVE METABOLIC PANEL - Abnormal; Notable for the following components:      Result Value   CO2 21 (*)    Glucose, Bld 215 (*)    BUN 33 (*)    Creatinine, Ser 1.74 (*)    GFR, Estimated 42 (*)    All other components within normal limits  RESP PANEL BY RT-PCR (FLU A&B, COVID) ARPGX2  CBC  D-DIMER, QUANTITATIVE  URINE DRUG SCREEN, QUALITATIVE ([facility] ONLY)  TROPONIN I (HIGH SENSITIVITY)  TROPONIN I (HIGH SENSITIVITY)     EKG  ED ECG REPORT I, Merlyn Lot, the attending physician, personally viewed and interpreted this ECG.   Date: 04/04/2021  EKG Time: 7:27  Rate: 100  Rhythm: sinus  Axis: normal  Intervals:  normal  ST&T Change: no stemi, no depression    RADIOLOGY Please see ED  Course for my review and interpretation.  I personally reviewed all radiographic images ordered to evaluate for the above acute complaints and reviewed radiology reports and findings.  These findings were personally discussed with the patient.  Please see medical record for radiology report.    PROCEDURES:  Critical Care performed: No  Procedures   MEDICATIONS ORDERED IN ED: Medications  albuterol (VENTOLIN HFA) 108 (90 Base) MCG/ACT inhaler 2 puff (has no administration in time range)  insulin aspart (novoLOG) injection 0-9 Units (has no administration in time range)  insulin aspart (novoLOG) injection 0-5 Units (has no administration in time range)  dextromethorphan-guaiFENesin (MUCINEX DM) 30-600 MG per 12 hr tablet 1 tablet (has no administration in time range)  ondansetron (ZOFRAN) injection 4 mg (has no administration in time range)  acetaminophen (TYLENOL) tablet 650 mg (has no administration in time range)  hydrALAZINE  (APRESOLINE) injection 5 mg (has no administration in time range)  sodium chloride 0.9 % bolus 1,000 mL (0 mLs Intravenous Stopped 04/04/21 1146)     IMPRESSION / MDM / ASSESSMENT AND PLAN / ED COURSE  I reviewed the triage vital signs and the nursing notes.                              Differential diagnosis includes, but is not limited to, dehydration, vasovagal, dysrhythmia, sdh,I ph, cva, tia, seizure, sick sinus  Patient presenting with symptoms as described above.  Nontoxic-appearing.  CT imaging as well as blood work reordered for the but differential.  EKG nonischemic.  The patient will be placed on continuous pulse oximetry and telemetry for monitoring.  Laboratory evaluation will be sent to evaluate for the above complaints.      Clinical Course as of 04/04/21 1155  Sun Apr 04, 2021  0736 Chest x-ray by my independent review does not show any evidence of pneumothorax or consolidation will await formal radiology report. [PR]  [phone] CT head on my review does not show acute intracranial abnormality [PR]  1127 Discussed findings with patient and family.  States has had a few of these syncopal episodes without clear etiology.  Do not feel comfortable going home at this time for outpatient follow-up would like to be admitted to the hospital which I think is reasonable given his age and risk factors.  Will consult hospitalist for admission. [PR]  1151 Discussed case in consultation with hospitalist to admit patient to their service. [PR]    Clinical Course User Index [PR] Merlyn Lot, MD     FINAL CLINICAL IMPRESSION(S) / ED DIAGNOSES   Final diagnoses:  Syncope, unspecified syncope type     Rx / DC Orders   ED Discharge Orders     None        Note:  This document was prepared using Dragon voice recognition software and may include unintentional dictation errors.    Merlyn Lot, MD 04/04/21 1155

## 2021-04-04 NOTE — Progress Notes (Signed)
Pt refused at this time. Will call if he changes his mind and one will be made available.

## 2021-04-04 NOTE — ED Notes (Signed)
Nurse just messaged this nurse to let me know that the room is still dirty.

## 2021-04-04 NOTE — ED Triage Notes (Signed)
Pt went to restroom and got dizzy. [place] [address] forward hit head. +LOC. Hx of same.

## 2021-04-04 NOTE — H&P (Signed)
History and Physical    Andrew Gonzalez BZJ:[id number] DOB: 1950/08/15 DOA: 04/04/2021  Referring MD/NP/PA:   PCP: McLean-Scocuzza, Nino Glow, MD   Patient coming from:  The patient is coming from home.    Chief Complaint: syncope  HPI: Andrew Gonzalez is a 71 y.o. male with medical history significant of hypertension, hyperlipidemia, diabetes mellitus, COPD, depression with anxiety, OSA on CPAP, PTSD, chronic back pain, CAD,  dCHF, CKD-3A, who presents with syncope.  Per pt and his wife at bedside, pt passed out when he was using commode, for approximately few seconds.  Patient had diaphoresis, no unilateral numbness or tinglings in extremities.  No facial droop or slurred speech. He hit his head against the wall.  Patient has chronic mild short of breath due to COPD, which has not changed.  No cough, chest pain, fever or chills.  Patient had 1 episode of vomiting yesterday and 1 episode of nonbilious nonbloody vomiting this morning.  Currently no active nausea, vomiting, diarrhea or abdominal pain.  No symptoms of UTI.  Patient states that he is taking prazosin for nightmares.   Data Reviewed and ED Course: pt was found to have WBC 9.8, negative D-dimer, troponin level 12, 12, renal function close to baseline, pending D-dimer, temperature normal, blood pressure 131/72, heart rate of 102, RR 20, oxygen saturation 100% on room air.  CT of the head is negative for acute intracranial abnormalities.  CT of C-spine is negative for acute injury, but showed degenerative disc disease.  Patient is placed on telemetry bed for observation.   EKG: I have personally reviewed.  Sinus rhythm, QTc 434, no obvious ischemic change.   Review of Systems:   General: no fevers, chills, no body weight gain, has fatigue HEENT: no blurry vision, hearing changes or sore throat Respiratory: has dyspnea, no coughing, wheezing CV: no chest pain, no palpitations GI: has nausea, vomiting, no abdominal pain, diarrhea,  constipation GU: no dysuria, burning on urination, increased urinary frequency, hematuria  Ext: no leg edema Neuro: no unilateral weakness, numbness, or tingling, no vision change or hearing loss. Has syncope Skin: no rash, no skin tear. MSK: No muscle spasm, no deformity, no limitation of range of movement in spin Heme: No easy bruising.  Travel history: No recent long distant travel.   Allergy:  Allergies  Allergen Reactions   Bactrim [Sulfamethoxazole-Trimethoprim]     Red face    Biofreeze [Menthol (Topical Analgesic)]     Rash     Past Medical History:  Diagnosis Date   Allergy    Chicken pox    Colon polyps    COPD (chronic obstructive pulmonary disease) ([place])    COVID-19    11/06/19 did not have mab infusion   Diabetes mellitus without complication (HCC)    Hyperlipidemia    Hypertension    Neuropathy    feet   PTSD (post-traumatic stress disorder)    Sleep apnea     Past Surgical History:  Procedure Laterality Date   COLONOSCOPY WITH PROPOFOL N/A 03/15/2021   Procedure: COLONOSCOPY WITH PROPOFOL;  Surgeon: Lucilla Lame, MD;  Location: [facility];  Service: Endoscopy;  Laterality: N/A;  Diabetic   TONSILLECTOMY      Social History:  reports that he quit smoking about 16 years ago. His smoking use included cigarettes. He has a 10.00 pack-year smoking history. He has never used smokeless tobacco. He reports that he does not currently use alcohol after a past usage of about 1.0 standard drink per  week. He reports that he does not use drugs.  Family History:  Family History  Problem Relation Age of Onset   Cancer Mother        bone, liver, lung former smoker ? lung died 2008-04-23   Diabetes Mother    Hypertension Mother    Atrial fibrillation Sister      Prior to Admission medications   Medication Sig Start Date End Date Taking? Authorizing Provider  amLODipine (NORVASC) 2.5 MG tablet Take 1 tablet (2.5 mg total) by mouth daily. 03/18/21   McLean-Scocuzza,  Nino Glow, MD  atorvastatin (LIPITOR) 20 MG tablet Take 1 tablet (20 mg total) by mouth daily at 6 PM. 03/18/21   McLean-Scocuzza, Nino Glow, MD  cetirizine (ZYRTEC) 10 MG tablet Take 1 tablet (10 mg total) by mouth daily. 03/18/21   McLean-Scocuzza, Nino Glow, MD  cholecalciferol (VITAMIN D3) 25 MCG (1000 UNIT) tablet Take 1,000 Units by mouth daily.    [provider]  Cinnamon 500 MG TABS Take 1 tablet by mouth daily.    [provider]  empagliflozin (JARDIANCE) 10 MG TABS tablet Take 1 tablet (10 mg total) by mouth daily. 03/22/21   McLean-Scocuzza, Nino Glow, MD  fluticasone (FLONASE) 50 MCG/ACT nasal spray Place 2 sprays into both nostrils daily. 03/18/21   McLean-Scocuzza, Nino Glow, MD  glucose blood (FREESTYLE LITE) test strip Use as instructed qd to bid E11.9 10/09/19   McLean-Scocuzza, Nino Glow, MD  Lancets (FREESTYLE) lancets Qd to bid Use as instructed E11.9 10/09/19   McLean-Scocuzza, Nino Glow, MD  metFORMIN (GLUCOPHAGE) 1000 MG tablet Take 1 tablet (1,000 mg total) by mouth 2 (two) times daily with a meal. 03/22/21   McLean-Scocuzza, Nino Glow, MD  Multiple Vitamin (MULTIVITAMIN) tablet Take 1 tablet by mouth daily.    [provider]  mupirocin ointment (BACTROBAN) 2 % Apply 1 application topically 2 (two) times daily. To scalp wound for 1 week 02/19/21   McLean-Scocuzza, Nino Glow, MD  prazosin (MINIPRESS) 1 MG capsule 1 mg qhs 09/15/20   [provider]  sertraline (ZOLOFT) 100 MG tablet 100 mg 1.5 tablets daily 09/15/20   [provider]  Sod Picosulfate-Mag Ox-Cit Acd (CLENPIQ) 10-3.5-12 MG-GM -GM/160ML SOLN Take 320 mLs by mouth as directed. 03/01/21   Lucilla Lame, MD  sodium chloride (OCEAN) 0.65 % SOLN nasal spray Place 1 spray into both nostrils as needed for congestion. 12/27/19   McLean-Scocuzza, Nino Glow, MD    Physical Exam: Vitals:   04/04/21 1761 04/04/21 0718 04/04/21 0725 04/04/21 1244  BP:   131/72 126/83  Pulse:   (!) 102   Resp:   20   Temp:    98.6 F (37 C)   TempSrc:   Oral   SpO2: 100%  100%   Weight:  79.4 kg    Height:  5\' 9"  (1.753 m)     General: Not in acute distress HEENT:       Eyes: PERRL, EOMI, no scleral icterus.       ENT: No discharge from the ears and nose, no pharynx injection, no tonsillar enlargement.        Neck: No JVD, no bruit, no mass felt. Heme: No neck lymph node enlargement. Cardiac: S1/S2, RRR, No murmurs, No gallops or rubs. Respiratory: No rales, wheezing, rhonchi or rubs. GI: Soft, nondistended, nontender, no rebound pain, no organomegaly, BS present. GU: No hematuria Ext: No pitting leg edema bilaterally. 1+DP/PT pulse bilaterally. Musculoskeletal: No joint deformities, No joint redness  or warmth, no limitation of ROM in spin. Skin: No rashes.  Neuro: Alert, oriented X3, cranial nerves II-XII grossly intact, moves all extremities normally. Psych: Patient is not psychotic, no suicidal or hemocidal ideation.  Labs on Admission: I have personally reviewed following labs and imaging studies  CBC: Recent Labs  Lab 04/04/21 0728  WBC 9.8  HGB 13.4  HCT 39.0  MCV 89.0  PLT 426   Basic Metabolic Panel: Recent Labs  Lab 04/04/21 0728  NA 135  K 4.9  CL 101  CO2 21*  GLUCOSE 215*  BUN 33*  CREATININE 1.74*  CALCIUM 9.5   GFR: Estimated Creatinine Clearance: 40.1 mL/min (A) (by C-G formula based on SCr of 1.74 mg/dL (H)). Liver Function Tests: Recent Labs  Lab 04/04/21 0728  AST 30  ALT 19  ALKPHOS 79  BILITOT 0.9  PROT 7.5  ALBUMIN 4.3   No results for input(s): LIPASE, AMYLASE in the last 168 hours. No results for input(s): AMMONIA in the last 168 hours. Coagulation Profile: No results for input(s): INR, PROTIME in the last 168 hours. Cardiac Enzymes: No results for input(s): CKTOTAL, CKMB, CKMBINDEX, TROPONINI in the last 168 hours. BNP (last 3 results) No results for input(s): PROBNP in the last 8760 hours. HbA1C: No results for input(s): HGBA1C in the last 72  hours. CBG: Recent Labs  Lab 04/04/21 1238  GLUCAP 127*   Lipid Profile: No results for input(s): CHOL, HDL, LDLCALC, TRIG, CHOLHDL, LDLDIRECT in the last 72 hours. Thyroid Function Tests: No results for input(s): TSH, T4TOTAL, FREET4, T3FREE, THYROIDAB in the last 72 hours. Anemia Panel: No results for input(s): VITAMINB12, FOLATE, FERRITIN, TIBC, IRON, RETICCTPCT in the last 72 hours. Urine analysis:    Component Value Date/Time   COLORURINE YELLOW 12/16/2020 [place] 12/16/2020 1052   LABSPEC 1.015 12/16/2020 1052   PHURINE 5.5 12/16/2020 1052   GLUCOSEU NEGATIVE 12/16/2020 1052   HGBUR NEGATIVE 12/16/2020 [place] 12/16/2020 1052   PROTEINUR NEGATIVE 12/16/2020 1052   NITRITE NEGATIVE 12/16/2020 1052   LEUKOCYTESUR NEGATIVE 12/16/2020 1052   Sepsis Labs: @LABRCNTIP (procalcitonin:4,lacticidven:4) )No results found for this or any previous visit (from the past 240 hour(s)).   Radiological Exams on Admission: CT HEAD WO CONTRAST (5MM)  Result Date: 04/04/2021 CLINICAL DATA:  71 year old male with history of trauma from a fall. EXAM: CT HEAD WITHOUT CONTRAST CT CERVICAL SPINE WITHOUT CONTRAST TECHNIQUE: Multidetector CT imaging of the head and cervical spine was performed following the standard protocol without intravenous contrast. Multiplanar CT image reconstructions of the cervical spine were also generated. RADIATION DOSE REDUCTION: This exam was performed according to the departmental dose-optimization program which includes automated exposure control, adjustment of the mA and/or kV according to patient size and/or use of iterative reconstruction technique. COMPARISON:  CT the head and cervical spine 02/12/2021. FINDINGS: CT HEAD FINDINGS Brain: Mild cerebral atrophy. Patchy areas of decreased attenuation are noted throughout the deep and periventricular white matter of the cerebral hemispheres bilaterally, compatible with mild chronic  microvascular ischemic disease. No evidence of acute infarction, hemorrhage, hydrocephalus, extra-axial collection or mass lesion/mass effect. Vascular: No hyperdense vessel or unexpected calcification. Skull: Normal. Negative for fracture or focal lesion. Sinuses/Orbits: No acute finding. Other: None. CT CERVICAL SPINE FINDINGS Alignment: Straightening of normal cervical lordosis, likely positional. Alignment is otherwise anatomic. Skull base and vertebrae: No acute fracture. No primary bone lesion or focal pathologic process. Soft tissues and spinal canal: No prevertebral fluid or swelling. No  visible canal hematoma. Disc levels: Multilevel degenerative disc disease, most severe at C5-C6 and C6-C7. Mild multilevel facet arthropathy. Upper chest: Unremarkable. Other: None. IMPRESSION: 1. No evidence of significant acute traumatic injury to the skull, brain or cervical spine. 2. The appearance of the brain is normal. 3. Multilevel degenerative disc disease and cervical spondylosis, as above. Electronically Signed   By: Vinnie Langton M.D.   On: 04/04/2021 08:10   CT Cervical Spine Wo Contrast  Result Date: 04/04/2021 CLINICAL DATA:  71 year old male with history of trauma from a fall. EXAM: CT HEAD WITHOUT CONTRAST CT CERVICAL SPINE WITHOUT CONTRAST TECHNIQUE: Multidetector CT imaging of the head and cervical spine was performed following the standard protocol without intravenous contrast. Multiplanar CT image reconstructions of the cervical spine were also generated. RADIATION DOSE REDUCTION: This exam was performed according to the departmental dose-optimization program which includes automated exposure control, adjustment of the mA and/or kV according to patient size and/or use of iterative reconstruction technique. COMPARISON:  CT the head and cervical spine 02/12/2021. FINDINGS: CT HEAD FINDINGS Brain: Mild cerebral atrophy. Patchy areas of decreased attenuation are noted throughout the deep and  periventricular white matter of the cerebral hemispheres bilaterally, compatible with mild chronic microvascular ischemic disease. No evidence of acute infarction, hemorrhage, hydrocephalus, extra-axial collection or mass lesion/mass effect. Vascular: No hyperdense vessel or unexpected calcification. Skull: Normal. Negative for fracture or focal lesion. Sinuses/Orbits: No acute finding. Other: None. CT CERVICAL SPINE FINDINGS Alignment: Straightening of normal cervical lordosis, likely positional. Alignment is otherwise anatomic. Skull base and vertebrae: No acute fracture. No primary bone lesion or focal pathologic process. Soft tissues and spinal canal: No prevertebral fluid or swelling. No visible canal hematoma. Disc levels: Multilevel degenerative disc disease, most severe at C5-C6 and C6-C7. Mild multilevel facet arthropathy. Upper chest: Unremarkable. Other: None. IMPRESSION: 1. No evidence of significant acute traumatic injury to the skull, brain or cervical spine. 2. The appearance of the brain is normal. 3. Multilevel degenerative disc disease and cervical spondylosis, as above. Electronically Signed   By: Vinnie Langton M.D.   On: 04/04/2021 08:10   DG Chest Portable 1 View  Result Date: 04/04/2021 CLINICAL DATA:  71 year old male with history of syncope. EXAM: PORTABLE CHEST 1 VIEW COMPARISON:  Chest x-ray 02/12/2021. FINDINGS: Lung volumes are low. No consolidative airspace disease. No pleural effusions. No pneumothorax. No pulmonary nodule or mass noted. Pulmonary vasculature and the cardiomediastinal silhouette are within normal limits. Atherosclerotic calcifications in the thoracic aorta. IMPRESSION: 1.  No radiographic evidence of acute cardiopulmonary disease. 2. Aortic atherosclerosis. Electronically Signed   By: Vinnie Langton M.D.   On: 04/04/2021 07:49      Assessment/Plan Principal Problem:   Syncope Active Problems:   HTN (hypertension)   Hyperlipidemia LDL goal <70    Nightmares   CAD (coronary artery disease)   OSA on CPAP   Type II diabetes mellitus with renal manifestations (HCC)   COPD (chronic obstructive pulmonary disease) (HCC)   Depression   Stage 3a chronic kidney disease (CKD) (HCC)   Chronic diastolic CHF (congestive heart failure) (HCC)   Syncope: Etiology is not clear. The differential diagnosis is broad, including vasovagal syncope, TIA/stroke, arrhythmia, ACS (less likely, given no chest pain), drug abuse, orthostatic status and PE (which is less likely due to negative D-dimer).  CT head is negative for acute intracranial abnormalities.  Patient had a 2D echo on 02/22/2021 which showed EF 60 to 65% with grade 1 diastolic dysfunction, no aortic stenosis.  Will not repeat 2D echo.  - Place on tele for obs - Orthostatic vital signs  - MRI-brain - Neuro checks  - UDS - IVF:1L of NS -Hold prazosin  HTN (hypertension) -IV hydralazine as needed -Amlodipine,  Hyperlipidemia LDL goal <70 -Lipitor  Nightmares -Hold prazosin which has side effects of orthostatic hypotension  CAD (coronary artery disease): No chest pain.  Troponin negative x2 -Continue Lipitor  OSA -on CPAP  Type II diabetes mellitus with renal manifestations [facility]): Recent A1c 8.2.  Poorly controlled.  Patient taking metformin and recently started Jardiance -Sliding scale insulin  COPD (chronic obstructive pulmonary disease) ([place]): Stable -Bronchodilators  Depression: -Zoloft  Stage 3a chronic kidney disease (CKD) ([place]): Close to baseline.  Recent baseline creatinine 1.4-1.7.  His creatinine is 1.74, BUN 33 -Follow-up by BMP  Chronic diastolic CHF (congestive heart failure) ([place]):   2D echo on 02/22/2021 showed EF 60 to 65% with grade 1 diastolic dysfunction, no aortic stenosis.  No leg edema or JVD.  CHF is compensated. -Watch volume status closely             DVT ppx: SQ Lovenox  Code Status: Full code  Family Communication:   Yes, patient's  wife   at bed side.       Disposition Plan:  Anticipate discharge back to previous environment  Consults called:  none  Admission status and Level of care: Telemetry Medical:    for obs     Severity of Illness:  The appropriate patient status for this patient is OBSERVATION. Observation status is judged to be reasonable and necessary in order to provide the required intensity of service to ensure the patient's safety. The patient's presenting symptoms, physical exam findings, and initial radiographic and laboratory data in the context of their medical condition is felt to place them at decreased risk for further clinical deterioration. Furthermore, it is anticipated that the patient will be medically stable for discharge from the hospital within 2 midnights of admission.        Date of Service 04/04/2021    [facility]   If 7PM-7AM, please contact night-coverage [email or web address] 04/04/2021, 1:07 PM

## 2021-04-04 NOTE — ED Notes (Signed)
Delay in urine collection die to pt urinating about 20 minutes ago and discarded it. Pt unable to urinate at this time.

## 2021-04-05 DIAGNOSIS — Z7984 Long term (current) use of oral hypoglycemic drugs: Secondary | ICD-10-CM | POA: Diagnosis not present

## 2021-04-05 DIAGNOSIS — I5032 Chronic diastolic (congestive) heart failure: Secondary | ICD-10-CM | POA: Diagnosis present

## 2021-04-05 DIAGNOSIS — N183 Chronic kidney disease, stage 3 unspecified: Secondary | ICD-10-CM | POA: Diagnosis not present

## 2021-04-05 DIAGNOSIS — E785 Hyperlipidemia, unspecified: Secondary | ICD-10-CM | POA: Diagnosis present

## 2021-04-05 DIAGNOSIS — E1122 Type 2 diabetes mellitus with diabetic chronic kidney disease: Secondary | ICD-10-CM | POA: Diagnosis present

## 2021-04-05 DIAGNOSIS — Z801 Family history of malignant neoplasm of trachea, bronchus and lung: Secondary | ICD-10-CM | POA: Diagnosis not present

## 2021-04-05 DIAGNOSIS — J449 Chronic obstructive pulmonary disease, unspecified: Secondary | ICD-10-CM | POA: Diagnosis present

## 2021-04-05 DIAGNOSIS — F32A Depression, unspecified: Secondary | ICD-10-CM | POA: Diagnosis present

## 2021-04-05 DIAGNOSIS — I130 Hypertensive heart and chronic kidney disease with heart failure and stage 1 through stage 4 chronic kidney disease, or unspecified chronic kidney disease: Secondary | ICD-10-CM | POA: Diagnosis present

## 2021-04-05 DIAGNOSIS — Z8616 Personal history of COVID-19: Secondary | ICD-10-CM | POA: Diagnosis not present

## 2021-04-05 DIAGNOSIS — G4733 Obstructive sleep apnea (adult) (pediatric): Secondary | ICD-10-CM | POA: Diagnosis present

## 2021-04-05 DIAGNOSIS — N179 Acute kidney failure, unspecified: Secondary | ICD-10-CM | POA: Diagnosis present

## 2021-04-05 DIAGNOSIS — N1831 Chronic kidney disease, stage 3a: Secondary | ICD-10-CM | POA: Diagnosis present

## 2021-04-05 DIAGNOSIS — Z8249 Family history of ischemic heart disease and other diseases of the circulatory system: Secondary | ICD-10-CM | POA: Diagnosis not present

## 2021-04-05 DIAGNOSIS — I2510 Atherosclerotic heart disease of native coronary artery without angina pectoris: Secondary | ICD-10-CM | POA: Diagnosis present

## 2021-04-05 DIAGNOSIS — F4312 Post-traumatic stress disorder, chronic: Secondary | ICD-10-CM | POA: Diagnosis present

## 2021-04-05 DIAGNOSIS — Z833 Family history of diabetes mellitus: Secondary | ICD-10-CM | POA: Diagnosis not present

## 2021-04-05 DIAGNOSIS — G894 Chronic pain syndrome: Secondary | ICD-10-CM | POA: Diagnosis present

## 2021-04-05 DIAGNOSIS — Z87891 Personal history of nicotine dependence: Secondary | ICD-10-CM | POA: Diagnosis not present

## 2021-04-05 DIAGNOSIS — I951 Orthostatic hypotension: Secondary | ICD-10-CM | POA: Diagnosis present

## 2021-04-05 DIAGNOSIS — R55 Syncope and collapse: Secondary | ICD-10-CM | POA: Diagnosis present

## 2021-04-05 DIAGNOSIS — Z808 Family history of malignant neoplasm of other organs or systems: Secondary | ICD-10-CM | POA: Diagnosis not present

## 2021-04-05 DIAGNOSIS — I10 Essential (primary) hypertension: Secondary | ICD-10-CM | POA: Diagnosis not present

## 2021-04-05 DIAGNOSIS — Z79899 Other long term (current) drug therapy: Secondary | ICD-10-CM | POA: Diagnosis not present

## 2021-04-05 LAB — BASIC METABOLIC PANEL
Anion gap: 13 (ref 5–15)
BUN: 29 mg/dL — ABNORMAL HIGH (ref 8–23)
CO2: 21 mmol/L — ABNORMAL LOW (ref 22–32)
Calcium: 8.7 mg/dL — ABNORMAL LOW (ref 8.9–10.3)
Chloride: 104 mmol/L (ref 98–111)
Creatinine, Ser: 1.63 mg/dL — ABNORMAL HIGH (ref 0.61–1.24)
GFR, Estimated: 45 mL/min — ABNORMAL LOW (ref >60)
Glucose, Bld: 132 mg/dL — ABNORMAL HIGH (ref 70–99)
Potassium: 4.2 mmol/L (ref 3.5–5.1)
Sodium: 138 mmol/L (ref 135–145)

## 2021-04-05 LAB — GLUCOSE, CAPILLARY
Glucose-Capillary: 180 mg/dL — ABNORMAL HIGH (ref 70–99)
Glucose-Capillary: 120 mg/dL — ABNORMAL HIGH (ref 70–99)
Glucose-Capillary: 172 mg/dL — ABNORMAL HIGH (ref 70–99)
Glucose-Capillary: 159 mg/dL — ABNORMAL HIGH (ref 70–99)

## 2021-04-05 MED ORDER — SODIUM CHLORIDE 0.9 % IV BOLUS: 500.0000 mL | Freq: Once | INTRAVENOUS | Status: DC

## 2021-04-05 MED ORDER — SODIUM CHLORIDE 0.9 % IV BOLUS
500.0000 mL | Freq: Once | INTRAVENOUS | Status: AC
  Administered 2021-04-05: 500 mL via INTRAVENOUS

## 2021-04-05 NOTE — Evaluation (Signed)
Physical Therapy Evaluation Patient Details Name: Andrew Gonzalez MRN: [id number] DOB: 1950-05-20 Today's Date: 04/05/2021  History of Present Illness  Pt is a 71 y.o. male presenting to hospital 2/26 after fainting spell while sitting down to go to bathroom and fell into wall (head went through drywall); c/o some neck pain.  Pt admitted with syncope.  PMH includes htn, HLD, DM, COPD, depression with anxiety, OSA on CPAP, PTSD, chronic back pain, CAD, dCHF, CKD-3A, COVID-19.  Clinical Impression  Prior to hospital admission, pt was independent with functional mobility; lives with his wife in 1st floor apt (0 STE).  Pt sitting up in recliner upon PT arrival.  Pt's BP 145/82 with HR 86 bpm in sitting; in standing pt's BP 123/74 with HR 101 bpm with c/o 7-8/10 "room spinning"; and in standing (after 3 minutes) BP 133/87 with HR 105 bpm with c/o 5/10 "room spinning" symptoms.  Pt agreeable to ambulation (despite symptoms) and able to ambulate 20 feet and then (after sitting rest break) 40 feet with RW use (CGA for safety); then ambulated short distance to/from bathroom with RW use and assisted back to bed end of session.  Pt noted to be slow and mildly shaky with ambulation (decreased B LE step length and foot clearance) but improved balance noted with use of RW.  Pt would benefit from skilled PT to address noted impairments and functional limitations (see below for any additional details).  Upon hospital discharge, pt would benefit from HHPT and assist with all mobility for safety.    Recommendations for follow up therapy are one component of a multi-disciplinary discharge planning process, led by the attending physician.  Recommendations may be updated based on patient status, additional functional criteria and insurance authorization.  Follow Up Recommendations Home health PT    Assistance Recommended at Discharge Intermittent Supervision/Assistance  Patient can return home with the following  A little  help with walking and/or transfers;A little help with bathing/dressing/bathroom;Assistance with cooking/housework;Assist for transportation    Equipment Recommendations Rolling walker (2 wheels)  Recommendations for Other Services  OT consult    Functional Status Assessment Patient has had a recent decline in their functional status and demonstrates the ability to make significant improvements in function in a reasonable and predictable amount of time.     Precautions / Restrictions Precautions Precautions: Fall Restrictions Weight Bearing Restrictions: No      Mobility  Bed Mobility Overal bed mobility: Modified Independent             General bed mobility comments: Sit to semi-supine in bed without any noted difficulties    Transfers Overall transfer level: Needs assistance Equipment used: None, Rolling walker (2 wheels) Transfers: Sit to/from Stand Sit to Stand: Min assist, Min guard, Supervision           General transfer comment: min assist to stand from recliner x1 trial (no UE support)--assist for balance; CGA to SBA to stand up to RW x2 trials from recliner and x1 trial from toilet; vc's for UE placement    Ambulation/Gait Ambulation/Gait assistance: Min guard Gait Distance (Feet):  (20 feet; 40 feet; (20 feet recliner to bathroom then x10 feet back to bed)) Assistive device: Rolling walker (2 wheels)   Gait velocity: decreased     General Gait Details: short steps with decreased B LE foot clearance; increased time to take steps (improved some with repetition)  Financial trader  Rankin (Stroke Patients Only)       Balance Overall balance assessment: Needs assistance Sitting-balance support: No upper extremity supported, Feet supported Sitting balance-Leahy Scale: Good Sitting balance - Comments: steady sitting reaching outside BOS   Standing balance support: No upper extremity supported Standing  balance-Leahy Scale: Poor Standing balance comment: pt shaky in static standing requiring CGA to min assist for balance                             Pertinent Vitals/Pain Pain Assessment Pain Assessment: 0-10 Pain Score: 4  Pain Location: headache Pain Descriptors / Indicators: Headache Pain Intervention(s): Limited activity within patient's tolerance, Monitored during session, Repositioned    Home Living Family/patient expects to be discharged to:: Private residence Living Arrangements: Spouse/significant other Available Help at Discharge: Family;Available PRN/intermittently Type of Home: Apartment Home Access: Level entry       Home Layout: One level Home Equipment: Grab bars - tub/shower      Prior Function Prior Level of Function : Independent/Modified Independent             Mobility Comments: 2 falls in past 6 weeks (both in bathroom and passed out)       Hand Dominance        Extremity/Trunk Assessment   Upper Extremity Assessment Upper Extremity Assessment: Defer to OT evaluation    Lower Extremity Assessment Lower Extremity Assessment: Generalized weakness    Cervical / Trunk Assessment Cervical / Trunk Assessment: Normal  Communication   Communication: No difficulties  Cognition Arousal/Alertness: Awake/alert Behavior During Therapy: WFL for tasks assessed/performed Overall Cognitive Status: Within Functional Limits for tasks assessed                                          General Comments  Pt agreeable to PT session; pt's wife present during session.    Exercises  Transfer and gait training with RW use   Assessment/Plan    PT Assessment Patient needs continued PT services  PT Problem List Decreased strength;Decreased activity tolerance;Decreased balance;Decreased mobility;Decreased knowledge of use of DME;Decreased knowledge of precautions       PT Treatment Interventions DME instruction;Gait  training;Functional mobility training;Therapeutic activities;Therapeutic exercise;Balance training;Patient/family education    PT Goals (Current goals can be found in the Care Plan section)  Acute Rehab PT Goals Patient Stated Goal: to go home PT Goal Formulation: With patient Time For Goal Achievement: 04/20/21 Potential to Achieve Goals: Good    Frequency Min 2X/week     Co-evaluation               AM-PAC PT "6 Clicks" Mobility  Outcome Measure Help needed turning from your back to your side while in a flat bed without using bedrails?: None Help needed moving from lying on your back to sitting on the side of a flat bed without using bedrails?: None Help needed moving to and from a bed to a chair (including a wheelchair)?: A Little Help needed standing up from a chair using your arms (e.g., wheelchair or bedside chair)?: A Little Help needed to walk in hospital room?: A Little Help needed climbing 3-5 steps with a railing? : A Little 6 Click Score: 20    End of Session Equipment Utilized During Treatment: Gait belt Activity Tolerance: Patient limited by fatigue Patient left: in bed;with call bell/phone  within reach;with bed alarm set;with family/visitor present Nurse Communication: Mobility status;Precautions;Other (comment) (pt's BP and HR vitals and symptoms) PT Visit Diagnosis: Unsteadiness on feet (R26.81);Other abnormalities of gait and mobility (R26.89);Muscle weakness (generalized) (M62.81);History of falling (Z91.81)    Time: 1025-8527 PT Time Calculation (min) (ACUTE ONLY): 46 min   Charges:   PT Evaluation $PT Eval Low Complexity: 1 Low PT Treatments $Therapeutic Exercise: 8-22 mins $Therapeutic Activity: 8-22 mins       Leitha Bleak, PT 04/05/21, 12:41 PM

## 2021-04-05 NOTE — TOC Initial Note (Signed)
Transition of Care [facility]) - Initial/Assessment Note    Patient Details  Name: Andrew Gonzalez MRN: [id number] Date of Birth: 02/09/1950  Transition of Care [facility]) CM/SW Contact:    Beverly Sessions, RN Phone Number: 04/05/2021, 2:41 PM  Clinical Narrative:                 Admitted for: Syncope Admitted from: home with wife, wife bat bedside PCP: [facility]: [place], [facility] [place]  PT recommending home health, BSC and RW Agreeable to home health. Does not have a preference of home health agency.  Referral made to [facility] with [place].  Danielle with [employer] has delivered DME to room   Expected Discharge Plan: [place] Barriers to Discharge: Continued Medical Work up   Patient Goals and CMS Choice        Expected Discharge Plan and Services Expected Discharge Plan: [place]   Discharge Planning Services: CM Consult   Living arrangements for the past 2 months: Single Family Home                 DME Arranged: 3-N-1, Walker rolling DME Agency: [employer] Date DME Agency Contacted: 04/05/21   Representative spoke with at DME Agency: [place]: PT, OT [place] Agency: [place] ([place]) Date HH Agency Contacted: 04/05/21   Representative spoke with at [place]  Prior Living Arrangements/Services Living arrangements for the past 2 months: [place] with:: Spouse Patient language and need for interpreter reviewed:: Yes Do you feel safe going back to the place where you live?: Yes      Need for Family Participation in Patient Care: Yes (Comment) Care giver support system in place?: Yes (comment)   Criminal Activity/Legal Involvement Pertinent to Current Situation/Hospitalization: No - Comment as needed  Activities of Daily Living Home Assistive Devices/Equipment: None ADL Screening (condition at time of admission) Patient's cognitive ability adequate to safely complete daily activities?:  Yes Is the patient deaf or have difficulty hearing?: No Does the patient have difficulty seeing, even when wearing glasses/contacts?: No Does the patient have difficulty concentrating, remembering, or making decisions?: No Patient able to express need for assistance with ADLs?: Yes Does the patient have difficulty dressing or bathing?: No Independently performs ADLs?: Yes (appropriate for developmental age) Does the patient have difficulty walking or climbing stairs?: No Weakness of Legs: None Weakness of Arms/Hands: None  Permission Sought/Granted                  Emotional Assessment Appearance:: Appears stated age     Orientation: : Oriented to Self, Oriented to Place, Oriented to  Time, Oriented to Situation Alcohol / Substance Use: Not Applicable Psych Involvement: No (comment)  Admission diagnosis:  Syncope [R55] Syncope, unspecified syncope type [R55] Patient Active Problem List   Diagnosis Date Noted   Syncope 04/04/2021   Type II diabetes mellitus with renal manifestations ([place]) 04/04/2021   COPD (chronic obstructive pulmonary disease) ([place]) 04/04/2021   Depression 04/04/2021   Stage 3a chronic kidney disease (CKD) ([place]) 04/04/2021   Chronic diastolic CHF (congestive heart failure) ([place]) 04/04/2021   Personal history of colonic polyps    OSA on CPAP 01/25/2021   Diverticula of colon 01/14/2021   Bowel wall thickening 01/14/2021   Hiatal hernia 12/02/2020   CAD (coronary artery disease) 12/02/2020   Abnormal CT of the chest 09/06/2020   Lung nodules 07/27/2020   Diverticulosis 07/08/2020   Tubular adenoma  of colon 07/08/2020   Abnormal MRI, lumbar spine 06/16/2020   Aortic atherosclerosis ([place]) 01/12/2020   Dyspnea on exertion 01/12/2020   Sinus tachycardia 01/12/2020   Aortic calcification (H[facility]) 01/06/2020   Hypertension associated with diabetes ([place]) 12/24/2019   Nightmares 12/18/2019   Anxiety 12/18/2019   Chronic post-traumatic stress disorder (PTSD)  12/18/2019   COVID-19 virus infection 11/10/2019   Acute exacerbation of chronic low back pain 09/25/2018   Hypomagnesemia 01/22/2018   Lumbar facet joint syndrome (Bilateral) (R>L) 01/22/2018   Chronic hip pain (Bilateral) 01/22/2018   DDD (degenerative disc disease), lumbosacral 01/22/2018   Lumbar spondylosis 01/22/2018   Lumbosacral Grade 1 Anterolisthesis of L5/S1 01/22/2018   L5 Pars defect (Bilateral) w/ spondylolisthesis 01/22/2018   Chronic low back pain (Primary Area of Pain) (Bilateral) (R>L) w/ sciatica (Right) 01/08/2018   Chronic lower extremity pain (Secondary Area of Pain) (Right) 01/08/2018   Chronic sacroiliac joint pain (Bilateral) (R>L) 01/08/2018   Chronic pain syndrome 01/08/2018   Pharmacologic therapy 01/08/2018   Disorder of skeletal system 01/08/2018   Problems influencing health status 01/08/2018   Allergic rhinitis 10/16/2017   HTN (hypertension) 04/14/2017   Hyperlipidemia LDL goal <70 04/14/2017   DM2 (diabetes mellitus, type 2) ([place]) 04/14/2017   Colon polyps 04/14/2017   Dupuytren contracture 04/14/2017   Vitamin D insufficiency 04/14/2017   Seasonal allergies 04/14/2017   PCP:  McLean-Scocuzza, Nino Glow, MD Pharmacy:   [facility] [employer], [address] [place] [employer] [place] [address] Phone: [phone] Fax: [place] [place] [address] Phone: [phone] Fax: [place] [place] [address] Phone: [phone] Fax: [phone]     Social Determinants of Health ([place]) Interventions    Readmission Risk Interventions No flowsheet data found.

## 2021-04-05 NOTE — Plan of Care (Signed)
  Problem: Health Behavior/Discharge Planning: Goal: Ability to manage health-related needs will improve Outcome: Progressing   

## 2021-04-05 NOTE — Progress Notes (Signed)
[facility] [place] NAME: Andrew Gonzalez    MR#:  [id number]  DATE OF BIRTH:  1950/11/03  SUBJECTIVE:    came in after patient had a syncopal episode fall and hit head against the wall while trying to sit on the commode. Has chronic dizziness spells intermittently. Denies chest pain or shortness of breath. Had some orthostatic hypotension will ambulating with OT.  Wife at bedside.   VITALS:  Blood pressure 129/71, pulse 89, temperature 98 F (36.7 C), temperature source Oral, resp. rate 19, height 5\' 9"  (1.753 m), weight 79.4 kg, SpO2 98 %.  PHYSICAL EXAMINATION:   GENERAL:  71 y.o.-year-old patient lying in the bed with no acute distress.  LUNGS: Normal breath sounds bilaterally, no wheezing, rales, rhonchi.  CARDIOVASCULAR: S1, S2 normal. No murmurs, rubs, or gallops.  ABDOMEN: Soft, nontender, nondistended. Bowel sounds present.  EXTREMITIES: No  edema b/l.    NEUROLOGIC: nonfocal  patient is alert and awake SKIN: No obvious rash, lesion, or ulcer.   LABORATORY PANEL:  CBC Recent Labs  Lab 04/04/21 0728  WBC 9.8  HGB 13.4  HCT 39.0  PLT 160    Chemistries  Recent Labs  Lab 04/04/21 0728 04/05/21 0626  NA 135 138  K 4.9 4.2  CL 101 104  CO2 21* 21*  GLUCOSE 215* 132*  BUN 33* 29*  CREATININE 1.74* 1.63*  CALCIUM 9.5 8.7*  AST 30  --   ALT 19  --   ALKPHOS 79  --   BILITOT 0.9  --    Cardiac Enzymes No results for input(s): TROPONINI in the last 168 hours. RADIOLOGY:  CT HEAD WO CONTRAST (5MM)  Result Date: 04/04/2021 CLINICAL DATA:  71 year old male with history of trauma from a fall. EXAM: CT HEAD WITHOUT CONTRAST CT CERVICAL SPINE WITHOUT CONTRAST TECHNIQUE: Multidetector CT imaging of the head and cervical spine was performed following the standard protocol without intravenous contrast. Multiplanar CT image reconstructions of the cervical spine were also generated. RADIATION DOSE REDUCTION: This exam was  performed according to the departmental dose-optimization program which includes automated exposure control, adjustment of the mA and/or kV according to patient size and/or use of iterative reconstruction technique. COMPARISON:  CT the head and cervical spine 02/12/2021. FINDINGS: CT HEAD FINDINGS Brain: Mild cerebral atrophy. Patchy areas of decreased attenuation are noted throughout the deep and periventricular white matter of the cerebral hemispheres bilaterally, compatible with mild chronic microvascular ischemic disease. No evidence of acute infarction, hemorrhage, hydrocephalus, extra-axial collection or mass lesion/mass effect. Vascular: No hyperdense vessel or unexpected calcification. Skull: Normal. Negative for fracture or focal lesion. Sinuses/Orbits: No acute finding. Other: None. CT CERVICAL SPINE FINDINGS Alignment: Straightening of normal cervical lordosis, likely positional. Alignment is otherwise anatomic. Skull base and vertebrae: No acute fracture. No primary bone lesion or focal pathologic process. Soft tissues and spinal canal: No prevertebral fluid or swelling. No visible canal hematoma. Disc levels: Multilevel degenerative disc disease, most severe at C5-C6 and C6-C7. Mild multilevel facet arthropathy. Upper chest: Unremarkable. Other: None. IMPRESSION: 1. No evidence of significant acute traumatic injury to the skull, brain or cervical spine. 2. The appearance of the brain is normal. 3. Multilevel degenerative disc disease and cervical spondylosis, as above. Electronically Signed   By: Vinnie Langton M.D.   On: 04/04/2021 08:10   CT Cervical Spine Wo Contrast  Result Date: 04/04/2021 CLINICAL DATA:  71 year old male with history of trauma from a fall. EXAM: CT  HEAD WITHOUT CONTRAST CT CERVICAL SPINE WITHOUT CONTRAST TECHNIQUE: Multidetector CT imaging of the head and cervical spine was performed following the standard protocol without intravenous contrast. Multiplanar CT image  reconstructions of the cervical spine were also generated. RADIATION DOSE REDUCTION: This exam was performed according to the departmental dose-optimization program which includes automated exposure control, adjustment of the mA and/or kV according to patient size and/or use of iterative reconstruction technique. COMPARISON:  CT the head and cervical spine 02/12/2021. FINDINGS: CT HEAD FINDINGS Brain: Mild cerebral atrophy. Patchy areas of decreased attenuation are noted throughout the deep and periventricular white matter of the cerebral hemispheres bilaterally, compatible with mild chronic microvascular ischemic disease. No evidence of acute infarction, hemorrhage, hydrocephalus, extra-axial collection or mass lesion/mass effect. Vascular: No hyperdense vessel or unexpected calcification. Skull: Normal. Negative for fracture or focal lesion. Sinuses/Orbits: No acute finding. Other: None. CT CERVICAL SPINE FINDINGS Alignment: Straightening of normal cervical lordosis, likely positional. Alignment is otherwise anatomic. Skull base and vertebrae: No acute fracture. No primary bone lesion or focal pathologic process. Soft tissues and spinal canal: No prevertebral fluid or swelling. No visible canal hematoma. Disc levels: Multilevel degenerative disc disease, most severe at C5-C6 and C6-C7. Mild multilevel facet arthropathy. Upper chest: Unremarkable. Other: None. IMPRESSION: 1. No evidence of significant acute traumatic injury to the skull, brain or cervical spine. 2. The appearance of the brain is normal. 3. Multilevel degenerative disc disease and cervical spondylosis, as above. Electronically Signed   By: Vinnie Langton M.D.   On: 04/04/2021 08:10   MR BRAIN WO CONTRAST  Result Date: 04/04/2021 CLINICAL DATA:  Syncope/presyncope, cerebrovascular cause suspected EXAM: MRI HEAD WITHOUT CONTRAST TECHNIQUE: Multiplanar, multiecho pulse sequences of the brain and surrounding structures were obtained without  intravenous contrast. COMPARISON:  None. FINDINGS: Brain: There is no acute infarction or intracranial hemorrhage. There is no intracranial mass, mass effect, or edema. There is no hydrocephalus or extra-axial fluid collection. Ventricles and sulci are within normal limits in size and configuration. Minimal patchy T2 hyperintensity in the supratentorial white matter is nonspecific but may reflect minor chronic microvascular ischemic changes. Vascular: Major vessel flow voids at the skull base are preserved. Skull and upper cervical spine: Normal marrow signal is preserved. Sinuses/Orbits: Patchy mucosal thickening.  Orbits are unremarkable. Other: Sella is unremarkable. Mastoid air cells are clear. T2 hyperintense lesion of the left fossa Rosenmuller likely reflects a retention cyst. IMPRESSION: No evidence of recent infarction, hemorrhage, or mass. Minor chronic microvascular ischemic changes. Electronically Signed   By: Macy Mis M.D.   On: 04/04/2021 16:45   DG Chest Portable 1 View  Result Date: 04/04/2021 CLINICAL DATA:  71 year old male with history of syncope. EXAM: PORTABLE CHEST 1 VIEW COMPARISON:  Chest x-ray 02/12/2021. FINDINGS: Lung volumes are low. No consolidative airspace disease. No pleural effusions. No pneumothorax. No pulmonary nodule or mass noted. Pulmonary vasculature and the cardiomediastinal silhouette are within normal limits. Atherosclerotic calcifications in the thoracic aorta. IMPRESSION: 1.  No radiographic evidence of acute cardiopulmonary disease. 2. Aortic atherosclerosis. Electronically Signed   By: Vinnie Langton M.D.   On: 04/04/2021 07:49    Assessment and Plan Andrew Gonzalez is a 71 y.o. male with medical history significant of hypertension, hyperlipidemia, diabetes mellitus, COPD, depression with anxiety, OSA on CPAP, PTSD, chronic back pain, CAD,  dCHF, CKD-3A, who presents with syncope.   Per pt and his wife at bedside, pt passed out when he was using commode,  for approximately few seconds.  Patient  had diaphoresis, no unilateral numbness or tinglings in extremities  syncope etiology unclear history of chronic dizziness/tinnitus -- vasovagal versus acute on chronic CKD stage III a -- mild orthostatic hypotension today. Received IV fluids. -- Ambulated well with physical therapy -- CT head negative -- MRI brain negative for stroke  acute on chronic stage IIIa kidney disease -- creatinine 1.2-- 1.4 -- came in with creatinine 1.7--- IV fluids 1.6 -- hold nephrotoxic agents -- pt not on any diuretic  type II diabetes with renal manifestations -- recent A1c 8.2 -- holding metformin given elevated creatinine -- will continue Jardience -- patient advised to follow-up with PCP as outpatient -- keep sugar checks at home  Depression -- continue Zoloft  CAD -- on Lipitor -- no chest pain. Troponin times two negative  obstructive sleep apnea -- patient is supposed to get CPAP delivered from [place]  physical therapy recommends home health PT OT  will continue monitor blood pressure and labs one more day.   Family communication : at bedside Consults : none CODE STATUS: full DVT Prophylaxis : enoxaparin Level of care: Med-Surg Status is: Inpatient Remains inpatient appropriate because: orthostatic hypotension mild continue to monitor blood pressure and metabolic panel.  Anticipate discharge tomorrow if continues to improve            TOTAL TIME TAKING CARE OF THIS PATIENT: 25 minutes.  >50% time spent on counselling and coordination of care  Note: This dictation was prepared with Dragon dictation along with smaller phrase technology. Any transcriptional errors that result from this process are unintentional.  Fritzi Mandes M.D    [facility]   CC: Primary care physician; McLean-Scocuzza, Nino Glow, MD

## 2021-04-05 NOTE — Evaluation (Signed)
Occupational Therapy Evaluation Patient Details Name: Andrew Gonzalez MRN: [id number] DOB: 1950-07-08 Today's Date: 04/05/2021   History of Present Illness Pt is a 71 y.o. male presenting to hospital 2/26 after fainting spell while sitting down to go to bathroom and fell into wall (head went through drywall); c/o some neck pain.  Pt admitted with syncope.  PMH includes htn, HLD, DM, COPD, depression with anxiety, OSA on CPAP, PTSD, chronic back pain, CAD, dCHF, CKD-3A, COVID-19.   Clinical Impression   Pt was seen for OT evaluation this date. Prior to hospital admission, pt was independent and lives with his spouse. Currently pt demonstrates mild impairments in dizziness with positional changes, decreased balance, and BLE weakness as described below (See OT problem list) which functionally limit his ability to perform ADL/self-care tasks. Pt currently requires PRN MIN A for LB ADL and CGA for ADL transfers with RW. Orthostatic vitals taken, MD notified. Pt/spouse educated in falls prevention and strategies to minimize symptoms of drop in BP with positional changes. Pt would benefit from skilled OT services while hospitalized to address noted impairments and functional limitations (see below for any additional details) in order to maximize safety and independence while minimizing falls risk and caregiver burden.     Recommendations for follow up therapy are one component of a multi-disciplinary discharge planning process, led by the attending physician.  Recommendations may be updated based on patient status, additional functional criteria and insurance authorization.   Follow Up Recommendations  No OT follow up    Assistance Recommended at Discharge Intermittent Supervision/Assistance  Patient can return home with the following A little help with walking and/or transfers;A little help with bathing/dressing/bathroom;Assistance with cooking/housework;Assist for transportation;Help with stairs or ramp for  entrance    Functional Status Assessment  Patient has had a recent decline in their functional status and demonstrates the ability to make significant improvements in function in a reasonable and predictable amount of time.  Equipment Recommendations  BSC/3in1;Tub/shower seat    Recommendations for Other Services       Precautions / Restrictions Precautions Precautions: Fall Restrictions Weight Bearing Restrictions: No      Mobility Bed Mobility Overal bed mobility: Modified Independent             General bed mobility comments: increased time/effort    Transfers Overall transfer level: Needs assistance Equipment used: Rolling walker (2 wheels) Transfers: Sit to/from Stand, Bed to chair/wheelchair/BSC Sit to Stand: Min guard     Step pivot transfers: Supervision     General transfer comment: CGA + time to come to full standing      Balance Overall balance assessment: Needs assistance Sitting-balance support: No upper extremity supported, Feet supported Sitting balance-Leahy Scale: Good     Standing balance support: No upper extremity supported, Single extremity supported Standing balance-Leahy Scale: Fair Standing balance comment: no shakiness noted, SBA to CGA                           ADL either performed or assessed with clinical judgement   ADL Overall ADL's : Needs assistance/impaired                                       General ADL Comments: In standing, pt's spouse doffed and donned new pants for pt while pt stood with BUE on RW for stability, CGA for ADL transfers,  PRN MIN A for LB ADL and spouse able to assist     Vision         Perception     Praxis      Pertinent Vitals/Pain Pain Assessment Pain Assessment: No/denies pain     Hand Dominance     Extremity/Trunk Assessment Upper Extremity Assessment Upper Extremity Assessment: Overall WFL for tasks assessed   Lower Extremity Assessment Lower  Extremity Assessment: Generalized weakness   Cervical / Trunk Assessment Cervical / Trunk Assessment: Normal   Communication Communication Communication: No difficulties   Cognition Arousal/Alertness: Awake/alert Behavior During Therapy: WFL for tasks assessed/performed Overall Cognitive Status: Within Functional Limits for tasks assessed                                       General Comments       Exercises Other Exercises Other Exercises: pt/spouse educated in falls prevention and strategies to minimize symptoms of drop in BP with positional changes   Shoulder Instructions      Home Living Family/patient expects to be discharged to:: Private residence Living Arrangements: Spouse/significant other Available Help at Discharge: Family;Available PRN/intermittently Type of Home: Apartment Home Access: Level entry     Home Layout: One level     Bathroom Shower/Tub: Teacher, early years/pre: Standard     Home Equipment: Grab bars - tub/shower          Prior Functioning/Environment Prior Level of Function : Independent/Modified Independent             Mobility Comments: 2 falls in past 6 weeks (both in bathroom and passed out)          OT Problem List: Decreased strength;Impaired balance (sitting and/or standing);Decreased knowledge of use of DME or AE      OT Treatment/Interventions: Self-care/ADL training;Therapeutic exercise;Therapeutic activities;Patient/family education;Balance training;DME and/or AE instruction    OT Goals(Current goals can be found in the care plan section) Acute Rehab OT Goals Patient Stated Goal: go home OT Goal Formulation: With patient/family Time For Goal Achievement: 04/19/21 Potential to Achieve Goals: Good ADL Goals Pt Will Transfer to Toilet: with modified independence;ambulating Additional ADL Goal #1: Pt will complete all aspects of showering with modified independence. Additional ADL Goal #2:  Pt will verbalize plan to implement learned strategies for ADL and ADL mobility to minimize falls/syncope risk.  OT Frequency: Min 2X/week    Co-evaluation              AM-PAC OT "6 Clicks" Daily Activity     Outcome Measure Help from another person eating meals?: None Help from another person taking care of personal grooming?: None Help from another person toileting, which includes using toliet, bedpan, or urinal?: A Little Help from another person bathing (including washing, rinsing, drying)?: A Little Help from another person to put on and taking off regular upper body clothing?: None Help from another person to put on and taking off regular lower body clothing?: A Little 6 Click Score: 21   End of Session Equipment Utilized During Treatment: Rolling walker (2 wheels) Nurse Communication: Other (comment) (MD - orthostatics)  Activity Tolerance: Patient tolerated treatment well Patient left: in chair;with call bell/phone within reach;with family/visitor present  OT Visit Diagnosis: Other abnormalities of gait and mobility (R26.89);History of falling (Z91.81)                Time: 1941-7408  OT Time Calculation (min): 23 min Charges:  OT General Charges $OT Visit: 1 Visit OT Evaluation $OT Eval Low Complexity: 1 Low OT Treatments $Therapeutic Activity: 8-22 mins  Ardeth Perfect., MPH, MS, OTR/L ascom [phone] 04/05/21, 1:08 PM

## 2021-04-06 ENCOUNTER — Inpatient Hospital Stay (INDEPENDENT_AMBULATORY_CARE_PROVIDER_SITE_OTHER): Payer: Medicare Other

## 2021-04-06 ENCOUNTER — Inpatient Hospital Stay: Payer: Medicare Other

## 2021-04-06 ENCOUNTER — Inpatient Hospital Stay
Admit: 2021-04-06 | Discharge: 2021-04-06 | Disposition: A | Discharge status: Home / Self Care | Payer: Medicare Other | Attending: Physician Assistant | Admitting: Physician Assistant

## 2021-04-06 ENCOUNTER — Telehealth: Payer: Self-pay | Admitting: Physician Assistant

## 2021-04-06 DIAGNOSIS — R55 Syncope and collapse: Secondary | ICD-10-CM

## 2021-04-06 DIAGNOSIS — N183 Chronic kidney disease, stage 3 unspecified: Secondary | ICD-10-CM | POA: Diagnosis not present

## 2021-04-06 DIAGNOSIS — I10 Essential (primary) hypertension: Secondary | ICD-10-CM | POA: Diagnosis not present

## 2021-04-06 LAB — BASIC METABOLIC PANEL
Anion gap: 9 (ref 5–15)
BUN: 29 mg/dL — ABNORMAL HIGH (ref 8–23)
CO2: 22 mmol/L (ref 22–32)
Calcium: 8.8 mg/dL — ABNORMAL LOW (ref 8.9–10.3)
Chloride: 106 mmol/L (ref 98–111)
Creatinine, Ser: 1.59 mg/dL — ABNORMAL HIGH (ref 0.61–1.24)
GFR, Estimated: 47 mL/min — ABNORMAL LOW (ref >60)
Glucose, Bld: 156 mg/dL — ABNORMAL HIGH (ref 70–99)
Potassium: 4.2 mmol/L (ref 3.5–5.1)
Sodium: 137 mmol/L (ref 135–145)

## 2021-04-06 LAB — GLUCOSE, CAPILLARY
Glucose-Capillary: 158 mg/dL — ABNORMAL HIGH (ref 70–99)
Glucose-Capillary: 192 mg/dL — ABNORMAL HIGH (ref 70–99)
Glucose-Capillary: 188 mg/dL — ABNORMAL HIGH (ref 70–99)

## 2021-04-06 MED ORDER — EMPAGLIFLOZIN 10 MG PO TABS: 10.0000 mg | ORAL_TABLET | Freq: Every day | ORAL | Status: DC

## 2021-04-06 MED ORDER — METFORMIN HCL 1000 MG PO TABS: 500.0000 mg | ORAL_TABLET | Freq: Two times a day (BID) | ORAL | 0 refills | Status: DC

## 2021-04-06 NOTE — Progress Notes (Signed)
Occupational Therapy Treatment Patient Details Name: Andrew Gonzalez MRN: [id number] DOB: [birth year] Today's Date: 04/06/2021   History of present illness Pt is a 71 y.o. male presenting to hospital 2/26 after fainting spell while sitting down to go to bathroom and fell into wall (head went through drywall); c/o some neck pain.  Pt admitted with syncope.  PMH includes htn, HLD, DM, COPD, depression with anxiety, OSA on CPAP, PTSD, chronic back pain, CAD, dCHF, CKD-3A, COVID-19.   OT comments  Pt seen for OT tx. Spouse present. Pt denied dizziness with standing, but did endorse mild dizziness this morning getting out of bed. Pt received in the recliner. BP taken, not orthostatic. Pt ambulated around the nurses station with RW and SBA, no dizziness noted, pt/family education provided for falls prevention, RW mgt, and how to set up [facility] at home. Pt/family verbalized understanding. Pt has met all acute OT goals. Will sign off.    Recommendations for follow up therapy are one component of a multi-disciplinary discharge planning process, led by the attending physician.  Recommendations may be updated based on patient status, additional functional criteria and insurance authorization.    Follow Up Recommendations  No OT follow up    Assistance Recommended at Discharge Intermittent Supervision/Assistance  Patient can return home with the following  A little help with walking and/or transfers;A little help with bathing/dressing/bathroom;Assistance with cooking/housework;Assist for transportation;Help with stairs or ramp for entrance   Equipment Recommendations  BSC/3in1;Tub/shower seat    Recommendations for Other Services      Precautions / Restrictions Precautions Precautions: Fall Restrictions Weight Bearing Restrictions: No       Mobility Bed Mobility               General bed mobility comments: NT, up in recliner    Transfers Overall transfer level: Needs assistance Equipment  used: Rolling walker (2 wheels), None Transfers: Sit to/from Stand Sit to Stand: Supervision           General transfer comment: SBA with and without RW, denied dizziness     Balance Overall balance assessment: Needs assistance Sitting-balance support: No upper extremity supported, Feet supported Sitting balance-Leahy Scale: Good     Standing balance support: No upper extremity supported, Single extremity supported, Bilateral upper extremity supported Standing balance-Leahy Scale: Good                             ADL either performed or assessed with clinical judgement   ADL Overall ADL's : Needs assistance/impaired                                       General ADL Comments: CGA-SBA for ADL transfers and mobility, supv for standing ADL    Extremity/Trunk Assessment              Vision       Perception     Praxis      Cognition Arousal/Alertness: Awake/alert Behavior During Therapy: WFL for tasks assessed/performed Overall Cognitive Status: Within Functional Limits for tasks assessed                                          Exercises Other Exercises Other Exercises: Pt ambulated around the nurses station with RW and  SBA, no dizziness noted, pt/family education provided for falls prevention, RW mgt    Shoulder Instructions       General Comments      Pertinent Vitals/ Pain       Pain Assessment Pain Assessment: No/denies pain  Home Living                                          Prior Functioning/Environment              Frequency           Progress Toward Goals  OT Goals(current goals can now be found in the care plan section)  Progress towards OT goals: Goals met/education completed, patient discharged from OT  Acute Rehab OT Goals Patient Stated Goal: go home OT Goal Formulation: All assessment and education complete, DC therapy Time For Goal Achievement:  04/19/21 Potential to Achieve Goals: Good  Plan All goals met and education completed, patient discharged from OT services    Co-evaluation                 AM-PAC OT "6 Clicks" Daily Activity     Outcome Measure   Help from another person eating meals?: None Help from another person taking care of personal grooming?: None Help from another person toileting, which includes using toliet, bedpan, or urinal?: None Help from another person bathing (including washing, rinsing, drying)?: A Little Help from another person to put on and taking off regular upper body clothing?: None Help from another person to put on and taking off regular lower body clothing?: A Little 6 Click Score: 22    End of Session Equipment Utilized During Treatment: Gait belt;Rolling walker (2 wheels)  OT Visit Diagnosis: Other abnormalities of gait and mobility (R26.89);History of falling (Z91.81)   Activity Tolerance Patient tolerated treatment well   Patient Left in chair;with call bell/phone within reach;with family/visitor present   Nurse Communication          Time: [id number] OT Time Calculation (min): 20 min  Charges: OT General Charges $OT Visit: 1 Visit OT Treatments $Therapeutic Activity: 8-22 mins  Ardeth Perfect., MPH, MS, OTR/L ascom [phone] 04/06/21, 12:53 PM

## 2021-04-06 NOTE — Discharge Instructions (Addendum)
Patient to check sugars at least three times a day and keep log of it. For now take metformin 500 mg twice a day till patient is seen by primary care physician Dr. Karlyn Agee as outpatient this week to start new therapy per her recommendation.

## 2021-04-06 NOTE — Progress Notes (Signed)
Mobility Specialist - Progress Note   04/06/21 1529  Mobility  Activity Off unit     Pt being transported off unit for MRI at time of arrival. Will attempt session another date/time as pt is available.    Kathee Delton Mobility Specialist 04/06/21, 3:30 PM

## 2021-04-06 NOTE — Consult Note (Signed)
Cardiology Consultation:   Patient ID: Andrew Gonzalez; [id number]; 03-Dec-1950   Admit date: 04/04/2021 Date of Consult: 04/06/2021  Primary Care Provider: McLean-Scocuzza, Nino Glow, MD Primary Cardiologist: End Primary Electrophysiologist:  None   Patient Profile:   Andrew Gonzalez is a 71 y.o. male with a hx of coronary artery calcification, Covid in 10/2019, DM, HTN, and HLD who is being seen today for the evaluation of syncope at the request of Dr. Posey Pronto.  History of Present Illness:   [name] was evaluated in 01/2020 after he was incidentally noted to have aortic atherosclerosis on CXR. At that time, he reported some lingering SOB following his Covid infection. Lexiscan MPI in 01/2020 without evidence of ischemia with an EF of 55-65% and was overall low risk. There was mild aortic atherosclerosis and minimal coronary artery calcification noted. Aortic ultrasound showed no evidence of abdominal aortic aneurysm.   He was last seen in the office in 01/2021 and was undergoing evaluation of a bone lesion in the left humerus of uncertain significance.  Bone scan had demonstrated very subtle uptake, prompting PET/CT that was most consistent with an indolent/benign osseous lesion. His longstanding dyspnea was improving, though not resolved. Given this, he underwent echo in 02/2021 showed an EF of 60-65%, no RWMA, Gr1DD, normal RV systolic function and ventricular cavity size, mild MR, and an estimated right atrial pressure of 3 mmHg.    He was recently restarted on metformin along with the addition of Jardiance this past week.  With this, he noted diarrhea.  On the morning of his admission, he sat down on the toilet to use the restroom and became dizzy.  He describes this as a spinning sensation.  He fell forward, striking the top of his head on the drywall.  He reports he never suffered full LOC.  Leading up to this event, he was without symptoms of angina, dyspnea, or palpitations.  He was  subsequently admitted to [facility] on 2/26 with dizziness and near syncope while sitting on the toilet having a BM. Upon arrival he was noted to be dehydrated with acute on CKD with a SCr 1.74 (baseline 1-1.2). Orthostatic vitals positive during the admission Work up including CT head, MRI brain reassuring. D-dimer negative. High sensitivity troponin normal x 2. BNP normal. Tele has been unrevealing. He has been managed by IM with noted improvement following IV fluids. Cardiology and neurology have been requested by IM at the request of the patient's PCP.  He indicates he did not fully suffer LOC.  Postevent, he remained without angina, palpitations, or dyspnea.  Leading up to these events, he has had noted an increase in his baseline tinnitus.  Currently, he appears well compensated and without symptoms of angina.  Past Medical History:  Diagnosis Date   Allergy    Chicken pox    Colon polyps    COPD (chronic obstructive pulmonary disease) ([place])    COVID-19    11/06/19 did not have mab infusion   Diabetes mellitus without complication (HCC)    Hyperlipidemia    Hypertension    Neuropathy    feet   PTSD (post-traumatic stress disorder)    Sleep apnea     Past Surgical History:  Procedure Laterality Date   COLONOSCOPY WITH PROPOFOL N/A 03/15/2021   Procedure: COLONOSCOPY WITH PROPOFOL;  Surgeon: Lucilla Lame, MD;  Location: [place];  Service: Endoscopy;  Laterality: N/A;  Diabetic   TONSILLECTOMY       Home Meds: Prior  to Admission medications   Medication Sig Start Date End Date Taking? Authorizing Provider  amLODipine (NORVASC) 2.5 MG tablet Take 1 tablet (2.5 mg total) by mouth daily. 03/18/21  Yes McLean-Scocuzza, Nino Glow, MD  atorvastatin (LIPITOR) 20 MG tablet Take 1 tablet (20 mg total) by mouth daily at 6 PM. 03/18/21  Yes McLean-Scocuzza, Nino Glow, MD  cetirizine (ZYRTEC) 10 MG tablet Take 1 tablet (10 mg total) by mouth daily. 03/18/21  Yes McLean-Scocuzza, Nino Glow, MD   cholecalciferol (VITAMIN D3) 25 MCG (1000 UNIT) tablet Take 1,000 Units by mouth daily.   Yes [provider]  Cinnamon 500 MG TABS Take 1 tablet by mouth daily.   Yes [provider]  empagliflozin (JARDIANCE) 25 MG TABS tablet Take 12.5 mg by mouth daily. 03/22/21  Yes [provider]  fluticasone (FLONASE) 50 MCG/ACT nasal spray Place 2 sprays into both nostrils daily. 03/18/21  Yes McLean-Scocuzza, Nino Glow, MD  metFORMIN (GLUCOPHAGE) 1000 MG tablet Take 1 tablet (1,000 mg total) by mouth 2 (two) times daily with a meal. 03/22/21  Yes McLean-Scocuzza, Nino Glow, MD  Multiple Vitamin (MULTIVITAMIN) tablet Take 1 tablet by mouth daily.   Yes [provider]  prazosin (MINIPRESS) 1 MG capsule 1 mg qhs 09/15/20  Yes [provider]  sertraline (ZOLOFT) 100 MG tablet 100 mg 1.5 tablets daily 09/15/20  Yes [provider]  sodium chloride (OCEAN) 0.65 % SOLN nasal spray Place 1 spray into both nostrils as needed for congestion. 12/27/19  Yes McLean-Scocuzza, Nino Glow, MD  glucose blood (FREESTYLE LITE) test strip Use as instructed qd to bid E11.9 10/09/19   McLean-Scocuzza, Nino Glow, MD  Lancets (FREESTYLE) lancets Qd to bid Use as instructed E11.9 10/09/19   McLean-Scocuzza, Nino Glow, MD  mupirocin ointment (BACTROBAN) 2 % Apply 1 application topically 2 (two) times daily. To scalp wound for 1 week 02/19/21   McLean-Scocuzza, Nino Glow, MD  Sod Picosulfate-Mag Ox-Cit Acd (CLENPIQ) 10-3.5-12 MG-GM -GM/160ML SOLN Take 320 mLs by mouth as directed. Patient not taking: Reported on 04/04/2021 03/01/21   Lucilla Lame, MD    Inpatient Medications: Scheduled Meds:  amLODipine  2.5 mg Oral Daily   atorvastatin  20 mg Oral q1800   cholecalciferol  1,000 Units Oral Daily   enoxaparin (LOVENOX) injection  40 mg Subcutaneous Q24H   fluticasone  2 spray Each Nare Daily   insulin aspart  0-5 Units Subcutaneous QHS   insulin aspart  0-9 Units Subcutaneous TID WC   loratadine   10 mg Oral Daily   multivitamin with minerals  1 tablet Oral Daily   sertraline  150 mg Oral Daily   Continuous Infusions:  sodium chloride     PRN Meds: acetaminophen, albuterol, dextromethorphan-guaiFENesin, ondansetron (ZOFRAN) IV  Allergies:   Allergies  Allergen Reactions   Bactrim [Sulfamethoxazole-Trimethoprim]     Red face    Biofreeze [Menthol (Topical Analgesic)]     Rash     Social History:   Social History   Socioeconomic History   Marital status: Married    Spouse name: Not on file   Number of children: Not on file   Years of education: Not on file   Highest education level: Not on file  Occupational History   Not on file  Tobacco Use   Smoking status: Former    Packs/day: 0.50    Years: 20.00    Pack years: 10.00    Types: Cigarettes    Quit date: 2007  Years since quitting: 16.1   Smokeless tobacco: Never  Vaping Use   Vaping Use: Never used  Substance and Sexual Activity   Alcohol use: Not Currently    Alcohol/week: 1.0 standard drink    Types: 1 Standard drinks or equivalent per week    Comment: occasional, maybe once a week   Drug use: No   Sexual activity: Yes    Partners: Female  Other Topics Concern   Not on file  Social History Narrative   Wears selt belt    Safe in relationship    Recently moved from [facility] 2018/19 originally from [facility] dad and sister 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    Retired Occupational hygienist degree    Social Determinants of Radio broadcast assistant Strain: Low Risk    Difficulty of Paying Living Expenses: Not hard at all  Food Insecurity: No Food Insecurity   Worried About Charity fundraiser in the Last Year: Never true   Arboriculturist in the Last Year: Never true  Transportation Needs: No Transportation Needs   Lack of Transportation (Medical): No   Lack of Transportation (Non-Medical): No  Physical Activity: Insufficiently Active   Days of Exercise per Week: 3 days   Minutes of Exercise per  Session: 30 min  Stress: No Stress Concern Present   Feeling of Stress : Not at all  Social Connections: Unknown   Frequency of Communication with Friends and Family: Not on file   Frequency of Social Gatherings with Friends and Family: Not on file   Attends Religious Services: Not on Electrical engineer or Organizations: Not on file   Attends Archivist Meetings: Not on file   Marital Status: Married  Human resources officer Violence: Not At Risk   Fear of Current or Ex-Partner: No   Emotionally Abused: No   Physically Abused: No   Sexually Abused: No     Family History:   Family History  Problem Relation Age of Onset   Cancer Mother        bone, liver, lung former smoker ? lung died 05-16-08   Diabetes Mother    Hypertension Mother    Atrial fibrillation Sister     ROS:  Review of Systems  Constitutional:  Positive for malaise/fatigue. Negative for chills, diaphoresis, fever and weight loss.  HENT:  Positive for tinnitus. Negative for congestion.   Eyes:  Negative for discharge and redness.  Respiratory:  Negative for cough, hemoptysis, sputum production, shortness of breath and wheezing.   Cardiovascular:  Negative for chest pain, palpitations, orthopnea, claudication, leg swelling and PND.  Gastrointestinal:  Positive for diarrhea. Negative for abdominal pain, blood in stool, constipation, heartburn, melena, nausea and vomiting.  Genitourinary:  Negative for hematuria.  Musculoskeletal:  Positive for falls. Negative for myalgias.  Skin:  Negative for rash.  Neurological:  Positive for dizziness. Negative for tingling, tremors, sensory change, speech change, focal weakness, loss of consciousness and weakness.       Room spinning   Endo/Heme/Allergies:  Does not bruise/bleed easily.  Psychiatric/Behavioral:  Negative for substance abuse. The patient is not nervous/anxious.   All other systems reviewed and are negative.    Physical Exam/Data:   Vitals:    04/05/21 1612 04/05/21 1936 04/06/21 0317 04/06/21 0759  BP: (!) 146/80 139/74 (!) 153/87 138/78  Pulse:  82 86 79  Resp: 18 16 18 16   Temp: 98.2 F (36.8 C) 98 F (36.7  C) 97.9 F (36.6 C) 97.6 F (36.4 C)  TempSrc: Oral  Oral Oral  SpO2: 100% 98% 99% 98%  Weight:      Height:        Intake/Output Summary (Last 24 hours) at 04/06/2021 0844 Last data filed at 04/06/2021 0657 Gross per 24 hour  Intake 680 ml  Output --  Net 680 ml   Filed Weights   04/04/21 0718  Weight: 79.4 kg   Body mass index is 25.84 kg/m.   Physical Exam: General: Well developed, well nourished, in no acute distress. Head: Normocephalic, atraumatic, sclera non-icteric, no xanthomas, nares without discharge.  Neck: Negative for carotid bruits. JVD not elevated. Lungs: Clear bilaterally to auscultation without wheezes, rales, or rhonchi. Breathing is unlabored. Heart: RRR with S1 S2. No murmurs, rubs, or gallops appreciated. Abdomen: Soft, non-tender, non-distended with normoactive bowel sounds. No hepatomegaly. No rebound/guarding. No obvious abdominal masses. Msk:  Strength and tone appear normal for age. Extremities: No clubbing or cyanosis. No edema. Distal pedal pulses are 2+ and equal bilaterally. Neuro: Alert and oriented X 3. No facial asymmetry. No focal deficit. Moves all extremities spontaneously. Psych:  Responds to questions appropriately with a normal affect.   EKG:  The EKG was personally reviewed and demonstrates: sinus tachycardia, 102 bpm, no acute st/t changes  Telemetry:  Telemetry was personally reviewed and demonstrates: Not currently on telemetry.  Available data scanned into Epic shows sinus rhythm  Weights: Filed Weights   04/04/21 0718  Weight: 79.4 kg    Relevant CV Studies:  2D echo 02/22/2021: 1. Left ventricular ejection fraction, by estimation, is 60 to 65%. The  left ventricle has normal function. The left ventricle has no regional  wall motion abnormalities.  Left ventricular diastolic parameters are  consistent with Grade I diastolic  dysfunction (impaired relaxation). The average left ventricular global  longitudinal strain is -15.6 %. The global longitudinal strain is normal.   2. Right ventricular systolic function is normal. The right ventricular  size is normal. Tricuspid regurgitation signal is inadequate for assessing  PA pressure.   3. The mitral valve is normal in structure. Mild mitral valve  regurgitation. No evidence of mitral stenosis.   4. The aortic valve is normal in structure. Aortic valve regurgitation is  not visualized. No aortic stenosis is present.   5. The inferior vena cava is normal in size with greater than 50%  respiratory variability, suggesting right atrial pressure of 3 mmHg. __________  Carlton Adam MPI 01/20/2020: There was no ST segment deviation noted during stress. No T wave inversion was noted during stress. The study is normal. This is a low risk study. The left ventricular ejection fraction is normal (55-65%). CT attenuation images showed mild aortic calcifications and minimal coronary calcifications.  Laboratory Data:  Chemistry Recent Labs  Lab 04/04/21 0728 04/05/21 0626 04/06/21 0439  NA 135 138 137  K 4.9 4.2 4.2  CL 101 104 106  CO2 21* 21* 22  GLUCOSE 215* 132* 156*  BUN 33* 29* 29*  CREATININE 1.74* 1.63* 1.59*  CALCIUM 9.5 8.7* 8.8*  GFRNONAA 42* 45* 47*  ANIONGAP 13 13 9     Recent Labs  Lab 04/04/21 0728  PROT 7.5  ALBUMIN 4.3  AST 30  ALT 19  ALKPHOS 79  BILITOT 0.9   Hematology Recent Labs  Lab 04/04/21 0728  WBC 9.8  RBC 4.38  HGB 13.4  HCT 39.0  MCV 89.0  MCH 30.6  MCHC 34.4  RDW 11.9  PLT 160   Cardiac EnzymesNo results for input(s): TROPONINI in the last 168 hours. No results for input(s): TROPIPOC in the last 168 hours.  BNP Recent Labs  Lab 04/04/21 1321  BNP 7.6    DDimer  Recent Labs  Lab 04/04/21 1218  DDIMER 0.33    Radiology/Studies:   CT HEAD WO CONTRAST (5MM)  Result Date: 04/04/2021 IMPRESSION: 1. No evidence of significant acute traumatic injury to the skull, brain or cervical spine. 2. The appearance of the brain is normal. 3. Multilevel degenerative disc disease and cervical spondylosis, as above. Electronically Signed   By: Vinnie Langton M.D.   On: 04/04/2021 08:10   CT Cervical Spine Wo Contrast  Result Date: 04/04/2021 IMPRESSION: 1. No evidence of significant acute traumatic injury to the skull, brain or cervical spine. 2. The appearance of the brain is normal. 3. Multilevel degenerative disc disease and cervical spondylosis, as above. Electronically Signed   By: Vinnie Langton M.D.   On: 04/04/2021 08:10   MR BRAIN WO CONTRAST  Result Date: 04/04/2021 IMPRESSION: No evidence of recent infarction, hemorrhage, or mass. Minor chronic microvascular ischemic changes. Electronically Signed   By: Macy Mis M.D.   On: 04/04/2021 16:45   DG Chest Portable 1 View  Result Date: 04/04/2021 IMPRESSION: 1.  No radiographic evidence of acute cardiopulmonary disease. 2. Aortic atherosclerosis. Electronically Signed   By: Vinnie Langton M.D.   On: 04/04/2021 07:49    Assessment and Plan:   History of syncope with dizziness: -Recently seen in the ED on 02/12/2021 with possible syncope and received staples for scalp laceration. Work up unrevealing -Reports he never suffered LOC with episode leading up to this admission -History is consistent with vasovagal etiology and possibly exacerbated by dehydration with noted orthostasis in the setting of recently started Jardiance and metformin with associated diarrhea -Also cannot fully exclude vertigo at this time based on history of spinning sensation and progressive tinnitus  -Available tele that is scanned into Epic unrevealing, not currently on tele -Recent normal Lexiscan MPI and echo -Place Zio AT patch  -Maintain adequate hydration  -He plans to see ENT as an  outpatient for evaluation of possible vertigo -Plan for outpatient coronary CTA -No driving for 6 months from date of last syncopal episode, or until a cause is found and adequately treated   Coronary artery calcification: -Minimal calcification noted on Lexiscan MPI in 01/2020 -No symptoms of angina -Ruled out -No indication for heparin gtt -Risk factor modification  -No plans for inpatient ischemic testing, plan for coronary CTA as above  Acute on CKD stage III: -Improved with IV fluids -Follow up with PCP -Avoid nephrotoxic agents  HTN: -Blood pressure well controlled  -PTA amlodipine   HLD: -PTA Lipitor   DM2: -Jardiance and metformin on hold -Follow up with PCP    For questions or updates, please contact [facility] Please consult [email or web address] for contact info under Cardiology/STEMI.   Signed, Christell Faith, PA-C [place] Pager: [phone] 04/06/2021, 8:44 AM

## 2021-04-06 NOTE — Discharge Summary (Signed)
Physician Discharge Summary   Patient: Andrew Gonzalez MRN: [id number] DOB: 08-19-1950  Admit date:     04/04/2021  Discharge date: 04/06/21  Discharge Physician: Fritzi Mandes   PCP: McLean-Scocuzza, Nino Glow, MD   Recommendations at discharge:    Patient to check sugars at least three times a day and keep log of it. For now take metformin 500 mg twice a day till patient is seen by primary care physician Dr. Karlyn Agee as outpatient this week to start new therapy per her recommendation. PCP to give referral for ENT as outpatient evaluation of chronic dizziness/tenant  Discharge Diagnoses: syncope suspected due to vasovagal versus orthostatic hypotension acute on chronic stage IIIa kidney disease  Hospital Course: Andrew Gonzalez is a 71 y.o. male with medical history significant of hypertension, hyperlipidemia, diabetes mellitus, COPD, depression with anxiety, OSA on CPAP, PTSD, chronic back pain, CAD,  dCHF, CKD-3A, who presents with syncope.   Per pt and his wife at bedside, pt passed out when he was using commode, for approximately few seconds.  Patient had diaphoresis, no unilateral numbness or tinglings in extremities   syncope etiology unclear history of chronic dizziness/tinnitus -- vasovagal versus acute on chronic CKD stage III a -- mild orthostatic hypotension today. Received IV fluids. -- Ambulated well with physical therapy -- CT head negative -- MRI brain negative for stroke -- patient did very well today. Overall felt better. -- PCP requested cardiology and neurology see patient. -- Per [facility] cardiology patient had normal echo in December 2022.Zio-patch monitor has been ordered which will be mailed to patient. Patient will follow-up with Dr. Saunders Revel as outpatient. -- Per neurology Dr. Quinn Axe MRA head and neck was done to rule out vertebral basilar insufficiency-- study was normal -- patient will follow-up with ENT as outpatient for chronic dizziness/tinnitus. PCP will give referral    acute on chronic stage IIIa kidney disease -- creatinine 1.2-- 1.4 -- came in with creatinine 1.7--- IV fluids 1.6 -- hold nephrotoxic agents -- pt not on any diuretic   type II diabetes with renal manifestations -- recent A1c 8.2 -- patient advised to follow-up with PCP as outpatient -- keep sugar checks at home. For now since creatinine is a bit better will continue metformin 500 mg BID. -- Dr. Faythe Casa will further manage diabetes as outpatient since she is working with PCP at [facility]. This was discussed with patient and patient's wife.   Depression/Nightmares/PTSD -- continue Zoloft --prazosin held at discharge   CAD -- on Lipitor -- no chest pain. Troponin times two negative   obstructive sleep apnea -- patient is supposed to get CPAP delivered from [place]   physical therapy recommends home health PT OT   overall hemodynamically stable. Discharge plan discussed with patient and wife. There are appreciative of the care received. Will discharged to home with outpatient follow-up PCP this week mainly to discuss diabetes and hospital follow-up             Consultants: [facility] cardiology, neurology Procedures performed: none Disposition: Home health Diet recommendation:  Cardiac and Carb modified diet  DISCHARGE MEDICATION: Allergies as of 04/06/2021       Reactions   Bactrim [sulfamethoxazole-trimethoprim]    Red face   Biofreeze [menthol (topical Analgesic)]    Rash        Medication List     STOP taking these medications    Clenpiq 10-3.5-12 MG-GM -GM/160ML Soln Generic drug: Sod Picosulfate-Mag Ox-Cit Acd   empagliflozin 25 MG  Tabs tablet Commonly known as: JARDIANCE   prazosin 1 MG capsule Commonly known as: MINIPRESS       TAKE these medications    amLODipine 2.5 MG tablet Commonly known as: NORVASC Take 1 tablet (2.5 mg total) by mouth daily.   atorvastatin 20 MG tablet Commonly known as: LIPITOR Take 1 tablet (20 mg total) by  mouth daily at 6 PM.   cetirizine 10 MG tablet Commonly known as: ZYRTEC Take 1 tablet (10 mg total) by mouth daily.   cholecalciferol 25 MCG (1000 UNIT) tablet Commonly known as: VITAMIN D3 Take 1,000 Units by mouth daily.   Cinnamon 500 MG Tabs Take 1 tablet by mouth daily.   fluticasone 50 MCG/ACT nasal spray Commonly known as: FLONASE Place 2 sprays into both nostrils daily.   freestyle lancets Qd to bid Use as instructed E11.9   FREESTYLE LITE test strip Generic drug: glucose blood Use as instructed qd to bid E11.9   metFORMIN 1000 MG tablet Commonly known as: GLUCOPHAGE Take 0.5 tablets (500 mg total) by mouth 2 (two) times daily with a meal. What changed: how much to take   multivitamin tablet Take 1 tablet by mouth daily.   mupirocin ointment 2 % Commonly known as: BACTROBAN Apply 1 application topically 2 (two) times daily. To scalp wound for 1 week   sertraline 100 MG tablet Commonly known as: ZOLOFT 100 mg 1.5 tablets daily   sodium chloride 0.65 % Soln nasal spray Commonly known as: OCEAN Place 1 spray into both nostrils as needed for congestion.               Durable Medical Equipment  (From admission, onward)           Start     Ordered   04/05/21 1234  For home use only DME Walker rolling  Once       Question Answer Comment  Walker: With 5 Inch Wheels   Patient needs a walker to treat with the following condition Weakness      04/05/21 1234   04/05/21 1234  For home use only DME 3 n 1  Once        04/05/21 1234            Follow-up Information     McLean-Scocuzza, Nino Glow, MD. Schedule an appointment as soon as possible for a visit in 3 day(s).   Specialty: Internal Medicine Why: For hospital follow-up and starting new treatment for diabetes Contact information: [address] [place] [address] [phone]         End, Harrell Gave, MD. Schedule an appointment as soon as possible for a visit in 2 week(s).    Specialty: Cardiology Why: Zio monitor f/u syncope Contact information: [address] [address] [phone]                 Discharge Exam: Danley Danker Weights   04/04/21 0718  Weight: 79.4 kg     Condition at discharge: fair  The results of significant diagnostics from this hospitalization (including imaging, microbiology, ancillary and laboratory) are listed below for reference.   Imaging Studies: CT HEAD WO CONTRAST (5MM)  Result Date: 04/04/2021 CLINICAL DATA:  71 year old male with history of trauma from a fall. EXAM: CT HEAD WITHOUT CONTRAST CT CERVICAL SPINE WITHOUT CONTRAST TECHNIQUE: Multidetector CT imaging of the head and cervical spine was performed following the standard protocol without intravenous contrast. Multiplanar CT image reconstructions of the cervical spine were  also generated. RADIATION DOSE REDUCTION: This exam was performed according to the departmental dose-optimization program which includes automated exposure control, adjustment of the mA and/or kV according to patient size and/or use of iterative reconstruction technique. COMPARISON:  CT the head and cervical spine 02/12/2021. FINDINGS: CT HEAD FINDINGS Brain: Mild cerebral atrophy. Patchy areas of decreased attenuation are noted throughout the deep and periventricular white matter of the cerebral hemispheres bilaterally, compatible with mild chronic microvascular ischemic disease. No evidence of acute infarction, hemorrhage, hydrocephalus, extra-axial collection or mass lesion/mass effect. Vascular: No hyperdense vessel or unexpected calcification. Skull: Normal. Negative for fracture or focal lesion. Sinuses/Orbits: No acute finding. Other: None. CT CERVICAL SPINE FINDINGS Alignment: Straightening of normal cervical lordosis, likely positional. Alignment is otherwise anatomic. Skull base and vertebrae: No acute fracture. No primary bone lesion or focal pathologic process. Soft  tissues and spinal canal: No prevertebral fluid or swelling. No visible canal hematoma. Disc levels: Multilevel degenerative disc disease, most severe at C5-C6 and C6-C7. Mild multilevel facet arthropathy. Upper chest: Unremarkable. Other: None. IMPRESSION: 1. No evidence of significant acute traumatic injury to the skull, brain or cervical spine. 2. The appearance of the brain is normal. 3. Multilevel degenerative disc disease and cervical spondylosis, as above. Electronically Signed   By: Vinnie Langton M.D.   On: 04/04/2021 08:10   CT Cervical Spine Wo Contrast  Result Date: 04/04/2021 CLINICAL DATA:  71 year old male with history of trauma from a fall. EXAM: CT HEAD WITHOUT CONTRAST CT CERVICAL SPINE WITHOUT CONTRAST TECHNIQUE: Multidetector CT imaging of the head and cervical spine was performed following the standard protocol without intravenous contrast. Multiplanar CT image reconstructions of the cervical spine were also generated. RADIATION DOSE REDUCTION: This exam was performed according to the departmental dose-optimization program which includes automated exposure control, adjustment of the mA and/or kV according to patient size and/or use of iterative reconstruction technique. COMPARISON:  CT the head and cervical spine 02/12/2021. FINDINGS: CT HEAD FINDINGS Brain: Mild cerebral atrophy. Patchy areas of decreased attenuation are noted throughout the deep and periventricular white matter of the cerebral hemispheres bilaterally, compatible with mild chronic microvascular ischemic disease. No evidence of acute infarction, hemorrhage, hydrocephalus, extra-axial collection or mass lesion/mass effect. Vascular: No hyperdense vessel or unexpected calcification. Skull: Normal. Negative for fracture or focal lesion. Sinuses/Orbits: No acute finding. Other: None. CT CERVICAL SPINE FINDINGS Alignment: Straightening of normal cervical lordosis, likely positional. Alignment is otherwise anatomic. Skull base  and vertebrae: No acute fracture. No primary bone lesion or focal pathologic process. Soft tissues and spinal canal: No prevertebral fluid or swelling. No visible canal hematoma. Disc levels: Multilevel degenerative disc disease, most severe at C5-C6 and C6-C7. Mild multilevel facet arthropathy. Upper chest: Unremarkable. Other: None. IMPRESSION: 1. No evidence of significant acute traumatic injury to the skull, brain or cervical spine. 2. The appearance of the brain is normal. 3. Multilevel degenerative disc disease and cervical spondylosis, as above. Electronically Signed   By: Vinnie Langton M.D.   On: 04/04/2021 08:10   MR ANGIO HEAD WO CONTRAST  Result Date: 04/06/2021 CLINICAL DATA:  Syncope/presyncope, cerebrovascular cause suspected syncope, rule out VBI. EXAM: MRA HEAD WITHOUT CONTRAST TECHNIQUE: Angiographic images of the Circle of Willis were acquired using MRA technique without intravenous contrast. COMPARISON:  No pertinent prior exam. FINDINGS: Anterior circulation: The internal carotid arteries are widely patent from skull base to carotid termini. ACAs and MCAs are patent without evidence of a proximal branch occlusion or significant proximal stenosis. No aneurysm  is identified. Posterior circulation: The included intracranial vertebral arteries are widely patent to the basilar with the left being dominant. Patent bilateral PICA, left AICA, and bilateral SCA origins are visualized. The basilar artery is widely patent. Posterior communicating arteries are diminutive or absent. Both PCAs are patent without evidence of a significant proximal stenosis. No aneurysm is identified. Anatomic variants: None. IMPRESSION: Negative head MRA. Electronically Signed   By: Logan Bores M.D.   On: 04/06/2021 16:37   MR ANGIO NECK WO CONTRAST  Result Date: 04/06/2021 CLINICAL DATA:  Syncope/presyncope, cerebrovascular cause suspected syncope, rule out VBI. EXAM: MRA NECK WITHOUT CONTRAST TECHNIQUE:  Angiographic images of the neck were acquired using MRA technique without intravenous contrast. Carotid stenosis measurements (when applicable) are obtained utilizing NASCET criteria, using the distal internal carotid diameter as the denominator. COMPARISON:  Cervical spine CT 04/04/2021. FINDINGS: Aortic arch: Standard 3 vessel aortic arch. Right carotid system: Patent without evidence of stenosis or dissection. Left carotid system: Patent without evidence of stenosis or dissection. Vertebral arteries: Patent with antegrade flow bilaterally. Moderately dominant left vertebral artery. Mild multifocal narrowing of the left V2 segment which is likely from extrinsic compression by degenerative cervical spine spurring based on the prior CT. No evidence of a flow limiting stenosis or dissection. IMPRESSION: Patent cervical carotid and vertebral arteries without evidence of a flow limiting stenosis or dissection. Electronically Signed   By: Logan Bores M.D.   On: 04/06/2021 16:34   MR BRAIN WO CONTRAST  Result Date: 04/04/2021 CLINICAL DATA:  Syncope/presyncope, cerebrovascular cause suspected EXAM: MRI HEAD WITHOUT CONTRAST TECHNIQUE: Multiplanar, multiecho pulse sequences of the brain and surrounding structures were obtained without intravenous contrast. COMPARISON:  None. FINDINGS: Brain: There is no acute infarction or intracranial hemorrhage. There is no intracranial mass, mass effect, or edema. There is no hydrocephalus or extra-axial fluid collection. Ventricles and sulci are within normal limits in size and configuration. Minimal patchy T2 hyperintensity in the supratentorial white matter is nonspecific but may reflect minor chronic microvascular ischemic changes. Vascular: Major vessel flow voids at the skull base are preserved. Skull and upper cervical spine: Normal marrow signal is preserved. Sinuses/Orbits: Patchy mucosal thickening.  Orbits are unremarkable. Other: Sella is unremarkable. Mastoid air  cells are clear. T2 hyperintense lesion of the left fossa Rosenmuller likely reflects a retention cyst. IMPRESSION: No evidence of recent infarction, hemorrhage, or mass. Minor chronic microvascular ischemic changes. Electronically Signed   By: Macy Mis M.D.   On: 04/04/2021 16:45   DG Chest Portable 1 View  Result Date: 04/04/2021 CLINICAL DATA:  71 year old male with history of syncope. EXAM: PORTABLE CHEST 1 VIEW COMPARISON:  Chest x-ray 02/12/2021. FINDINGS: Lung volumes are low. No consolidative airspace disease. No pleural effusions. No pneumothorax. No pulmonary nodule or mass noted. Pulmonary vasculature and the cardiomediastinal silhouette are within normal limits. Atherosclerotic calcifications in the thoracic aorta. IMPRESSION: 1.  No radiographic evidence of acute cardiopulmonary disease. 2. Aortic atherosclerosis. Electronically Signed   By: Vinnie Langton M.D.   On: 04/04/2021 07:49    Microbiology: Results for orders placed or performed during the hospital encounter of 04/04/21  Resp Panel by RT-PCR (Flu A&B, Covid) Nasopharyngeal Swab     Status: None   Collection Time: 04/04/21 12:24 PM   Specimen: Nasopharyngeal Swab; Nasopharyngeal(NP) swabs in vial transport medium  Result Value Ref Range Status   SARS Coronavirus 2 by RT PCR NEGATIVE NEGATIVE Final    Comment: (NOTE) SARS-CoV-2 target nucleic acids are NOT DETECTED.  The SARS-CoV-2 RNA is generally detectable in upper respiratory specimens during the acute phase of infection. The lowest concentration of SARS-CoV-2 viral copies this assay can detect is 138 copies/mL. A negative result does not preclude SARS-Cov-2 infection and should not be used as the sole basis for treatment or other patient management decisions. A negative result may occur with  improper specimen collection/handling, submission of specimen other than nasopharyngeal swab, presence of viral mutation(s) within the areas targeted by this assay, and  inadequate number of viral copies(<138 copies/mL). A negative result must be combined with clinical observations, patient history, and epidemiological information. The expected result is Negative.  Fact Sheet for Patients:  EntrepreneurPulse.com.au  Fact Sheet for Healthcare Providers:  IncredibleEmployment.be  This test is no t yet approved or cleared by the [place] FDA and  has been authorized for detection and/or diagnosis of SARS-CoV-2 by FDA under an Emergency Use Authorization (EUA). This EUA will remain  in effect (meaning this test can be used) for the duration of the COVID-19 declaration under Section 564(b)(1) of the Act, 21 U.S.C.section 360bbb-3(b)(1), unless the authorization is terminated  or revoked sooner.       Influenza A by PCR NEGATIVE NEGATIVE Final   Influenza B by PCR NEGATIVE NEGATIVE Final    Comment: (NOTE) The Xpert Xpress SARS-CoV-2/FLU/RSV plus assay is intended as an aid in the diagnosis of influenza from Nasopharyngeal swab specimens and should not be used as a sole basis for treatment. Nasal washings and aspirates are unacceptable for Xpert Xpress SARS-CoV-2/FLU/RSV testing.  Fact Sheet for Patients: EntrepreneurPulse.com.au  Fact Sheet for Healthcare Providers: IncredibleEmployment.be  This test is not yet approved or cleared by the [place] FDA and has been authorized for detection and/or diagnosis of SARS-CoV-2 by FDA under an Emergency Use Authorization (EUA). This EUA will remain in effect (meaning this test can be used) for the duration of the COVID-19 declaration under Section 564(b)(1) of the Act, 21 U.S.C. section 360bbb-3(b)(1), unless the authorization is terminated or revoked.  Performed at [facility], [address] [place], [place] [address]     Labs: CBC: Recent Labs  Lab 04/04/21 0728  WBC 9.8  HGB 13.4  HCT 39.0  MCV  89.0  PLT 408   Basic Metabolic Panel: Recent Labs  Lab 04/04/21 0728 04/05/21 0626 04/06/21 0439  NA 135 138 137  K 4.9 4.2 4.2  CL 101 104 106  CO2 21* 21* 22  GLUCOSE 215* 132* 156*  BUN 33* 29* 29*  CREATININE 1.74* 1.63* 1.59*  CALCIUM 9.5 8.7* 8.8*   Liver Function Tests: Recent Labs  Lab 04/04/21 0728  AST 30  ALT 19  ALKPHOS 79  BILITOT 0.9  PROT 7.5  ALBUMIN 4.3   CBG: Recent Labs  Lab 04/05/21 1116 04/05/21 1611 04/05/21 2128 04/06/21 0758 04/06/21 1156  GLUCAP 120* 172* 159* 158* 192*    Discharge time spent: greater than 30 minutes.  Signed: Fritzi Mandes, MD [facility] 04/06/2021

## 2021-04-06 NOTE — Progress Notes (Signed)
PT Cancellation Note  Patient Details Name: Andrew Gonzalez MRN: [id number] DOB: 1950/03/02   Cancelled Treatment:    Reason Eval/Treat Not Completed: Patient at procedure or test/unavailable.  Pt currently not in his room (off unit at MRI per pt's nurse).  Will re-attempt PT session at a later date/time.  Leitha Bleak, PT 04/06/21, 3:36 PM

## 2021-04-06 NOTE — Telephone Encounter (Signed)
Per Christell Faith, PA New order needed for patient to have a Zio AT mailed to his home. (Order placed). Dx syncope. To be worn for 14 days.  Zio AT placed during the patients admission had to be removed prior to the patient having an MRI. Originally placed Zio will need to be cancelled.

## 2021-04-06 NOTE — Progress Notes (Signed)
Discharge instructions reviewed with patient utilizing teach back method no questions at this time. Patient discharged to home.

## 2021-04-07 ENCOUNTER — Encounter: Payer: Self-pay | Admitting: Internal Medicine

## 2021-04-07 ENCOUNTER — Other Ambulatory Visit: Payer: Self-pay

## 2021-04-07 ENCOUNTER — Ambulatory Visit (INDEPENDENT_AMBULATORY_CARE_PROVIDER_SITE_OTHER): Payer: Medicare Other | Admitting: Internal Medicine

## 2021-04-07 VITALS — BP 134/80 | HR 86 | Temp 97.5°F | Ht 69.0 in | Wt 171.2 lb

## 2021-04-07 DIAGNOSIS — N179 Acute kidney failure, unspecified: Secondary | ICD-10-CM | POA: Diagnosis not present

## 2021-04-07 DIAGNOSIS — H9319 Tinnitus, unspecified ear: Secondary | ICD-10-CM | POA: Diagnosis not present

## 2021-04-07 DIAGNOSIS — R42 Dizziness and giddiness: Secondary | ICD-10-CM

## 2021-04-07 DIAGNOSIS — R269 Unspecified abnormalities of gait and mobility: Secondary | ICD-10-CM | POA: Diagnosis not present

## 2021-04-07 DIAGNOSIS — R5381 Other malaise: Secondary | ICD-10-CM | POA: Diagnosis not present

## 2021-04-07 DIAGNOSIS — R55 Syncope and collapse: Secondary | ICD-10-CM

## 2021-04-07 NOTE — Telephone Encounter (Signed)
Called Otto Herb with [employer]. [phone]. ?To cancel the original Zio AT order. Lmtcb ? ?

## 2021-04-07 NOTE — Telephone Encounter (Signed)
Patient wife dropped off zio box to office. Given to [employer] ?

## 2021-04-07 NOTE — Progress Notes (Signed)
Chief Complaint  Patient presents with   Hospitalization Follow-up   Hosp f/u 2/26-2/28/23 [facility] sycope, aki, dehydratoin 1. Syncope  Could be due to prazosin (will disc with [facility] to stop), dehydration with metformin/jardiance, +OH Doing well has PT/OT at home  Stop prazosin  2. Dm 2 in cbgs in 200s 203 and upper 170s at home currently on no medications  Hold jardiance due to aki/dehydration  Januvia did not help sugar control  Ask PCP Dr. Kelton Pillar at [facility] for ozmepic or GLP alternative   3. Physical deconditioning requests new shower chair and rollator with seat/brakes will have home health pt/ot   Feeling somewhat stronger but not 100%   Review of Systems  Constitutional:  Negative for weight loss.  HENT:  Negative for hearing loss.   Eyes:  Negative for blurred vision.  Respiratory:  Negative for shortness of breath.   Cardiovascular:  Negative for chest pain.  Gastrointestinal:  Negative for abdominal pain and blood in stool.  Musculoskeletal:  Negative for back pain.  Skin:  Negative for rash.  Neurological:  Negative for headaches.  Psychiatric/Behavioral:  Negative for depression.   Past Medical History:  Diagnosis Date   Allergy    Chicken pox    Colon polyps    COPD (chronic obstructive pulmonary disease) ([place])    COVID-19    11/06/19 did not have mab infusion   Diabetes mellitus without complication (HCC)    Hyperlipidemia    Hypertension    Neuropathy    feet   PTSD (post-traumatic stress disorder)    Sleep apnea    Past Surgical History:  Procedure Laterality Date   COLONOSCOPY WITH PROPOFOL N/A 03/15/2021   Procedure: COLONOSCOPY WITH PROPOFOL;  Surgeon: Lucilla Lame, MD;  Location: [place];  Service: Endoscopy;  Laterality: N/A;  Diabetic   TONSILLECTOMY     Family History  Problem Relation Age of Onset   Cancer Mother        bone, liver, lung former smoker ? lung died 05-02-08   Diabetes Mother    Hypertension Mother    Atrial fibrillation  Sister    Social History   Socioeconomic History   Marital status: Married    Spouse name: Not on file   Number of children: Not on file   Years of education: Not on file   Highest education level: Not on file  Occupational History   Not on file  Tobacco Use   Smoking status: Former    Packs/day: 0.50    Years: 20.00    Pack years: 10.00    Types: Cigarettes    Quit date: 02-May-2005    Years since quitting: 16.1   Smokeless tobacco: Never  Vaping Use   Vaping Use: Never used  Substance and Sexual Activity   Alcohol use: Not Currently    Alcohol/week: 1.0 standard drink    Types: 1 Standard drinks or equivalent per week    Comment: occasional, maybe once a week   Drug use: No   Sexual activity: Yes    Partners: Female  Other Topics Concern   Not on file  Social History Narrative   Wears selt belt    Safe in relationship    Recently moved from [facility] 2018/19 originally from [facility] dad and sister 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    Retired Occupational hygienist degree    Social Determinants of Health   Financial Resource Strain: Low Risk    Difficulty of Paying Living  Expenses: Not hard at all  Food Insecurity: No Food Insecurity   Worried About Charity fundraiser in the Last Year: Never true   Ran Out of Food in the Last Year: Never true  Transportation Needs: No Transportation Needs   Lack of Transportation (Medical): No   Lack of Transportation (Non-Medical): No  Physical Activity: Insufficiently Active   Days of Exercise per Week: 3 days   Minutes of Exercise per Session: 30 min  Stress: No Stress Concern Present   Feeling of Stress : Not at all  Social Connections: Unknown   Frequency of Communication with Friends and Family: Not on file   Frequency of Social Gatherings with Friends and Family: Not on file   Attends Religious Services: Not on file   Active Member of Clubs or Organizations: Not on file   Attends Archivist Meetings: Not on file   Marital  Status: Married  Human resources officer Violence: Not At Risk   Fear of Current or Ex-Partner: No   Emotionally Abused: No   Physically Abused: No   Sexually Abused: No   Current Meds  Medication Sig   amLODipine (NORVASC) 2.5 MG tablet Take 1 tablet (2.5 mg total) by mouth daily.   atorvastatin (LIPITOR) 20 MG tablet Take 1 tablet (20 mg total) by mouth daily at 6 PM.   cetirizine (ZYRTEC) 10 MG tablet Take 1 tablet (10 mg total) by mouth daily.   cholecalciferol (VITAMIN D3) 25 MCG (1000 UNIT) tablet Take 1,000 Units by mouth daily.   Cinnamon 500 MG TABS Take 1 tablet by mouth daily.   fluticasone (FLONASE) 50 MCG/ACT nasal spray Place 2 sprays into both nostrils daily.   glucose blood (FREESTYLE LITE) test strip Use as instructed qd to bid E11.9   Lancets (FREESTYLE) lancets Qd to bid Use as instructed E11.9   metFORMIN (GLUCOPHAGE) 1000 MG tablet Take 0.5 tablets (500 mg total) by mouth 2 (two) times daily with a meal.   Multiple Vitamin (MULTIVITAMIN) tablet Take 1 tablet by mouth daily.   mupirocin ointment (BACTROBAN) 2 % Apply 1 application topically 2 (two) times daily. To scalp wound for 1 week   sertraline (ZOLOFT) 100 MG tablet 100 mg 1.5 tablets daily   sodium chloride (OCEAN) 0.65 % SOLN nasal spray Place 1 spray into both nostrils as needed for congestion.   Allergies  Allergen Reactions   Bactrim [Sulfamethoxazole-Trimethoprim]     Red face    Biofreeze [Menthol (Topical Analgesic)]     Rash    Recent Results (from the past 2160 hour(s))  Glucose, capillary     Status: Abnormal   Collection Time: 01/12/21  1:05 PM  Result Value Ref Range   Glucose-Capillary 135 (H) 70 - 99 mg/dL    Comment: Glucose reference range applies only to samples taken after fasting for at least 8 hours.  CBC     Status: Abnormal   Collection Time: 02/12/21  7:49 AM  Result Value Ref Range   WBC 9.1 4.0 - 10.5 K/uL   RBC 3.96 (L) 4.22 - 5.81 MIL/uL   Hemoglobin 12.3 (L) 13.0 - 17.0 g/dL    HCT 36.1 (L) 39.0 - 52.0 %   MCV 91.2 80.0 - 100.0 fL   MCH 31.1 26.0 - 34.0 pg   MCHC 34.1 30.0 - 36.0 g/dL   RDW 12.2 11.5 - 15.5 %   Platelets 229 150 - 400 K/uL   nRBC 0.0 0.0 - 0.2 %  Comment: Performed at [facility], [address] [place], [place] [address]  Comprehensive metabolic panel     Status: Abnormal   Collection Time: 02/12/21  7:49 AM  Result Value Ref Range   Sodium 135 135 - 145 mmol/L   Potassium 4.6 3.5 - 5.1 mmol/L   Chloride 103 98 - 111 mmol/L   CO2 22 22 - 32 mmol/L   Glucose, Bld 257 (H) 70 - 99 mg/dL    Comment: Glucose reference range applies only to samples taken after fasting for at least 8 hours.   BUN 20 8 - 23 mg/dL   Creatinine, Ser 1.42 (H) 0.61 - 1.24 mg/dL   Calcium 9.1 8.9 - 10.3 mg/dL   Total Protein 6.8 6.5 - 8.1 g/dL   Albumin 3.7 3.5 - 5.0 g/dL   AST 21 15 - 41 U/L   ALT 17 0 - 44 U/L   Alkaline Phosphatase 88 38 - 126 U/L   Total Bilirubin 1.0 0.3 - 1.2 mg/dL   GFR, Estimated 53 (L) >60 mL/min    Comment: (NOTE) Calculated using the CKD-EPI Creatinine Equation (2021)    Anion gap 10 5 - 15    Comment: Performed at [facility], [place], [place] [address]  Troponin I (High Sensitivity)     Status: None   Collection Time: 02/12/21  7:49 AM  Result Value Ref Range   Troponin I (High Sensitivity) 9 <18 ng/L    Comment: (NOTE) Elevated high sensitivity troponin I (hsTnI) values and significant  changes across serial measurements may suggest ACS but many other  chronic and acute conditions are known to elevate hsTnI results.  Refer to the "Links" section for chest pain algorithms and additional  guidance. Performed at [facility], [address] [place], [place] [address]   ECHOCARDIOGRAM COMPLETE     Status: None   Collection Time: 02/22/21  9:06 AM  Result Value Ref Range   AR max vel 3.16 cm2   AV Peak grad 3.5 mmHg   Ao pk vel 0.94 m/s   S' Lateral 3.60 cm   Area-P 1/2  3.34 cm2   AV Area VTI 3.44 cm2   AV Mean grad 2.0 mmHg   Single Plane A4C EF 59.0 %   Single Plane A2C EF 68.1 %   Calc EF 64.1 %   AV Area mean vel 2.79 cm2   MV VTI 3.54 cm2  Glucose, capillary     Status: Abnormal   Collection Time: 03/15/21  7:17 AM  Result Value Ref Range   Glucose-Capillary 171 (H) 70 - 99 mg/dL    Comment: Glucose reference range applies only to samples taken after fasting for at least 8 hours.  POCT glycosylated hemoglobin (Hb A1C)     Status: Abnormal   Collection Time: 03/18/21 11:35 AM  Result Value Ref Range   Hemoglobin A1C 8.2 (A) 4.0 - 5.6 %   HbA1c POC (<> result, manual entry)     HbA1c, POC (prediabetic range)     HbA1c, POC (controlled diabetic range)    CBC     Status: None   Collection Time: 04/04/21  7:28 AM  Result Value Ref Range   WBC 9.8 4.0 - 10.5 K/uL   RBC 4.38 4.22 - 5.81 MIL/uL   Hemoglobin 13.4 13.0 - 17.0 g/dL   HCT 39.0 39.0 - 52.0 %   MCV 89.0 80.0 - 100.0 fL   MCH 30.6 26.0 - 34.0  pg   MCHC 34.4 30.0 - 36.0 g/dL   RDW 11.9 11.5 - 15.5 %   Platelets 160 150 - 400 K/uL   nRBC 0.0 0.0 - 0.2 %    Comment: Performed at [facility], [address] [place], [place] [address]  Comprehensive metabolic panel     Status: Abnormal   Collection Time: 04/04/21  7:28 AM  Result Value Ref Range   Sodium 135 135 - 145 mmol/L   Potassium 4.9 3.5 - 5.1 mmol/L   Chloride 101 98 - 111 mmol/L   CO2 21 (L) 22 - 32 mmol/L   Glucose, Bld 215 (H) 70 - 99 mg/dL    Comment: Glucose reference range applies only to samples taken after fasting for at least 8 hours.   BUN 33 (H) 8 - 23 mg/dL   Creatinine, Ser 1.74 (H) 0.61 - 1.24 mg/dL   Calcium 9.5 8.9 - 10.3 mg/dL   Total Protein 7.5 6.5 - 8.1 g/dL   Albumin 4.3 3.5 - 5.0 g/dL   AST 30 15 - 41 U/L   ALT 19 0 - 44 U/L   Alkaline Phosphatase 79 38 - 126 U/L   Total Bilirubin 0.9 0.3 - 1.2 mg/dL   GFR, Estimated 42 (L) >60 mL/min    Comment: (NOTE) Calculated using the CKD-EPI  Creatinine Equation (2021)    Anion gap 13 5 - 15    Comment: Performed at [facility], [place], [place] [address]  Troponin I (High Sensitivity)     Status: None   Collection Time: 04/04/21  7:28 AM  Result Value Ref Range   Troponin I (High Sensitivity) 12 <18 ng/L    Comment: (NOTE) Elevated high sensitivity troponin I (hsTnI) values and significant  changes across serial measurements may suggest ACS but many other  chronic and acute conditions are known to elevate hsTnI results.  Refer to the "Links" section for chest pain algorithms and additional  guidance. Performed at [facility], [place], [place] [address]   Troponin I (High Sensitivity)     Status: None   Collection Time: 04/04/21  9:40 AM  Result Value Ref Range   Troponin I (High Sensitivity) 12 <18 ng/L    Comment: (NOTE) Elevated high sensitivity troponin I (hsTnI) values and significant  changes across serial measurements may suggest ACS but many other  chronic and acute conditions are known to elevate hsTnI results.  Refer to the "Links" section for chest pain algorithms and additional  guidance. Performed at [facility], [address] [place], [place] [address]   D-dimer, quantitative     Status: None   Collection Time: 04/04/21 12:18 PM  Result Value Ref Range   D-Dimer, Quant 0.33 0.00 - 0.50 ug/mL-FEU    Comment: (NOTE) At the manufacturer cut-off value of 0.5 g/mL FEU, this assay has a negative predictive value of 95-100%.This assay is intended for use in conjunction with a clinical pretest probability (PTP) assessment model to exclude pulmonary embolism (PE) and deep venous thrombosis (DVT) in outpatients suspected of PE or DVT. Results should be correlated with clinical presentation. Performed at [facility], [address] [place], [place] [address]   HIV Antibody (routine testing w rflx)     Status: None   Collection  Time: 04/04/21 12:18 PM  Result Value Ref Range   HIV Screen 4th Generation wRfx Non Reactive Non Reactive    Comment: Performed at [facility], [place] [address] [place], [place] [address]  Resp Panel by RT-PCR (Flu A&B, Covid) Nasopharyngeal Swab     Status: None   Collection Time: 04/04/21 12:24 PM   Specimen: Nasopharyngeal Swab; Nasopharyngeal(NP) swabs in vial transport medium  Result Value Ref Range   SARS Coronavirus 2 by RT PCR NEGATIVE NEGATIVE    Comment: (NOTE) SARS-CoV-2 target nucleic acids are NOT DETECTED.  The SARS-CoV-2 RNA is generally detectable in upper respiratory specimens during the acute phase of infection. The lowest concentration of SARS-CoV-2 viral copies this assay can detect is 138 copies/mL. A negative result does not preclude SARS-Cov-2 infection and should not be used as the sole basis for treatment or other patient management decisions. A negative result may occur with  improper specimen collection/handling, submission of specimen other than nasopharyngeal swab, presence of viral mutation(s) within the areas targeted by this assay, and inadequate number of viral copies(<138 copies/mL). A negative result must be combined with clinical observations, patient history, and epidemiological information. The expected result is Negative.  Fact Sheet for Patients:  EntrepreneurPulse.com.au  Fact Sheet for Healthcare Providers:  IncredibleEmployment.be  This test is no t yet approved or cleared by the [place] FDA and  has been authorized for detection and/or diagnosis of SARS-CoV-2 by FDA under an Emergency Use Authorization (EUA). This EUA will remain  in effect (meaning this test can be used) for the duration of the COVID-19 declaration under Section 564(b)(1) of the Act, 21 U.S.C.section 360bbb-3(b)(1), unless the authorization is terminated  or revoked sooner.       Influenza A by PCR NEGATIVE NEGATIVE    Influenza B by PCR NEGATIVE NEGATIVE    Comment: (NOTE) The Xpert Xpress SARS-CoV-2/FLU/RSV plus assay is intended as an aid in the diagnosis of influenza from Nasopharyngeal swab specimens and should not be used as a sole basis for treatment. Nasal washings and aspirates are unacceptable for Xpert Xpress SARS-CoV-2/FLU/RSV testing.  Fact Sheet for Patients: EntrepreneurPulse.com.au  Fact Sheet for Healthcare Providers: IncredibleEmployment.be  This test is not yet approved or cleared by the [place] FDA and has been authorized for detection and/or diagnosis of SARS-CoV-2 by FDA under an Emergency Use Authorization (EUA). This EUA will remain in effect (meaning this test can be used) for the duration of the COVID-19 declaration under Section 564(b)(1) of the Act, 21 U.S.C. section 360bbb-3(b)(1), unless the authorization is terminated or revoked.  Performed at [facility], [address] [place], [place] [address]   CBG monitoring, ED     Status: Abnormal   Collection Time: 04/04/21 12:38 PM  Result Value Ref Range   Glucose-Capillary 127 (H) 70 - 99 mg/dL    Comment: Glucose reference range applies only to samples taken after fasting for at least 8 hours.   Comment 1 Notify RN    Comment 2 Document in Chart   Brain natriuretic peptide     Status: None   Collection Time: 04/04/21  1:21 PM  Result Value Ref Range   B Natriuretic Peptide 7.6 0.0 - 100.0 pg/mL    Comment: Performed at [facility], [address] [place], [place] [address]  Urine Drug Screen, Qualitative ([place] only)     Status: None   Collection Time: 04/04/21  4:49 PM  Result Value Ref Range   Tricyclic, Ur Screen NONE DETECTED NONE DETECTED   Amphetamines, Ur Screen NONE DETECTED NONE DETECTED   MDMA (Ecstasy)Ur Screen NONE DETECTED NONE DETECTED   Cocaine Metabolite,Ur  [place] NONE DETECTED NONE DETECTED   Opiate, Ur Screen NONE DETECTED NONE  DETECTED   Phencyclidine (PCP) Ur S NONE DETECTED NONE DETECTED   Cannabinoid 50 Ng, Ur [place] NONE DETECTED NONE DETECTED   Barbiturates, Ur Screen NONE DETECTED NONE DETECTED   Benzodiazepine, Ur Scrn NONE DETECTED NONE DETECTED   Methadone Scn, Ur NONE DETECTED NONE DETECTED    Comment: (NOTE) Tricyclics + metabolites, urine    Cutoff 1000 ng/mL Amphetamines + metabolites, urine  Cutoff 1000 ng/mL MDMA (Ecstasy), urine              Cutoff 500 ng/mL Cocaine Metabolite, urine          Cutoff 300 ng/mL Opiate + metabolites, urine        Cutoff 300 ng/mL Phencyclidine (PCP), urine         Cutoff 25 ng/mL Cannabinoid, urine                 Cutoff 50 ng/mL Barbiturates + metabolites, urine  Cutoff 200 ng/mL Benzodiazepine, urine              Cutoff 200 ng/mL Methadone, urine                   Cutoff 300 ng/mL  The urine drug screen provides only a preliminary, unconfirmed analytical test result and should not be used for non-medical purposes. Clinical consideration and professional judgment should be applied to any positive drug screen result due to possible interfering substances. A more specific alternate chemical method must be used in order to obtain a confirmed analytical result. Gas chromatography / mass spectrometry (GC/MS) is the preferred confirm atory method. Performed at [facility], [address] [place], [place] [address]   Glucose, capillary     Status: Abnormal   Collection Time: 04/04/21  6:20 PM  Result Value Ref Range   Glucose-Capillary 173 (H) 70 - 99 mg/dL    Comment: Glucose reference range applies only to samples taken after fasting for at least 8 hours.  Glucose, capillary     Status: Abnormal   Collection Time: 04/04/21  8:35 PM  Result Value Ref Range   Glucose-Capillary 131 (H) 70 - 99 mg/dL    Comment: Glucose reference range applies only to samples taken after fasting for at least 8 hours.  Basic metabolic panel     Status: Abnormal    Collection Time: 04/05/21  6:26 AM  Result Value Ref Range   Sodium 138 135 - 145 mmol/L    Comment: Performed at [facility], [address] [place], [place] [address]   Potassium 4.2 3.5 - 5.1 mmol/L    Comment: Performed at [facility], [address] [place], [place] [address]   Chloride 104 98 - 111 mmol/L    Comment: Performed at [facility], [place], [place] [address]   CO2 21 (L) 22 - 32 mmol/L    Comment: Performed at [facility], [place], [place] [address]   Glucose, Bld 132 (H) 70 - 99 mg/dL    Comment: Glucose reference range applies only to samples taken after fasting for at least 8 hours. Performed at [facility], [address] [place], [place] [address]    BUN 29 (H) 8 - 23 mg/dL   Creatinine, Ser 1.63 (H) 0.61 - 1.24 mg/dL   Calcium 8.7 (L) 8.9 - 10.3 mg/dL   GFR, Estimated 45 (L) >60 mL/min  Comment: (NOTE) Calculated using the CKD-EPI Creatinine Equation (2021)    Anion gap 13 5 - 15    Comment: Performed at [facility], [address] [place], [place] [address]  Glucose, capillary     Status: Abnormal   Collection Time: 04/05/21  8:10 AM  Result Value Ref Range   Glucose-Capillary 180 (H) 70 - 99 mg/dL    Comment: Glucose reference range applies only to samples taken after fasting for at least 8 hours.  Glucose, capillary     Status: Abnormal   Collection Time: 04/05/21 11:16 AM  Result Value Ref Range   Glucose-Capillary 120 (H) 70 - 99 mg/dL    Comment: Glucose reference range applies only to samples taken after fasting for at least 8 hours.  Glucose, capillary     Status: Abnormal   Collection Time: 04/05/21  4:11 PM  Result Value Ref Range   Glucose-Capillary 172 (H) 70 - 99 mg/dL    Comment: Glucose reference range applies only to samples taken after fasting for at least 8 hours.  Glucose, capillary     Status: Abnormal   Collection Time: 04/05/21  9:28  PM  Result Value Ref Range   Glucose-Capillary 159 (H) 70 - 99 mg/dL    Comment: Glucose reference range applies only to samples taken after fasting for at least 8 hours.   Comment 1 Notify RN   Basic metabolic panel     Status: Abnormal   Collection Time: 04/06/21  4:39 AM  Result Value Ref Range   Sodium 137 135 - 145 mmol/L   Potassium 4.2 3.5 - 5.1 mmol/L   Chloride 106 98 - 111 mmol/L   CO2 22 22 - 32 mmol/L   Glucose, Bld 156 (H) 70 - 99 mg/dL    Comment: Glucose reference range applies only to samples taken after fasting for at least 8 hours.   BUN 29 (H) 8 - 23 mg/dL   Creatinine, Ser 1.59 (H) 0.61 - 1.24 mg/dL   Calcium 8.8 (L) 8.9 - 10.3 mg/dL   GFR, Estimated 47 (L) >60 mL/min    Comment: (NOTE) Calculated using the CKD-EPI Creatinine Equation (2021)    Anion gap 9 5 - 15    Comment: Performed at [facility], [address] [place], [place] [address]  Glucose, capillary     Status: Abnormal   Collection Time: 04/06/21  7:58 AM  Result Value Ref Range   Glucose-Capillary 158 (H) 70 - 99 mg/dL    Comment: Glucose reference range applies only to samples taken after fasting for at least 8 hours.  Glucose, capillary     Status: Abnormal   Collection Time: 04/06/21 11:56 AM  Result Value Ref Range   Glucose-Capillary 192 (H) 70 - 99 mg/dL    Comment: Glucose reference range applies only to samples taken after fasting for at least 8 hours.  Glucose, capillary     Status: Abnormal   Collection Time: 04/06/21  5:52 PM  Result Value Ref Range   Glucose-Capillary 188 (H) 70 - 99 mg/dL    Comment: Glucose reference range applies only to samples taken after fasting for at least 8 hours.   Objective  Body mass index is 25.28 kg/m. Wt Readings from Last 3 Encounters:  04/07/21 171 lb 3.2 oz (77.7 kg)  04/04/21 175 lb (79.4 kg)  03/18/21 175 lb 3.2 oz (79.5 kg)   Temp Readings from Last 3 Encounters:  04/07/21 (!) 97.5 F (36.4 C) (  Oral)  04/06/21 98.6 F (37  C)  03/18/21 97.7 F (36.5 C) (Oral)   BP Readings from Last 3 Encounters:  04/07/21 134/80  04/06/21 122/79  03/18/21 128/64   Pulse Readings from Last 3 Encounters:  04/07/21 86  04/06/21 77  03/18/21 85    Physical Exam Vitals and nursing note reviewed.  Constitutional:      Appearance: Normal appearance. He is well-developed and well-groomed.  HENT:     Head: Normocephalic and atraumatic.  Eyes:     Conjunctiva/sclera: Conjunctivae normal.     Pupils: Pupils are equal, round, and reactive to light.  Cardiovascular:     Rate and Rhythm: Normal rate and regular rhythm.     Heart sounds: Normal heart sounds.  Pulmonary:     Effort: Pulmonary effort is normal. No respiratory distress.     Breath sounds: Normal breath sounds.  Abdominal:     Tenderness: There is no abdominal tenderness.  Skin:    General: Skin is warm and moist.  Neurological:     General: No focal deficit present.     Mental Status: He is alert and oriented to person, place, and time. Mental status is at baseline.     Sensory: Sensation is intact.     Motor: Motor function is intact.     Coordination: Coordination is intact.     Gait: Gait is intact. Gait normal.  Psychiatric:        Attention and Perception: Attention and perception normal.        Mood and Affect: Mood and affect normal.        Speech: Speech normal.        Behavior: Behavior normal. Behavior is cooperative.        Thought Content: Thought content normal.        Cognition and Memory: Cognition and memory normal.        Judgment: Judgment normal.    Assessment  Plan  Syncope,+OH thought due to jardiance/metformin/prazosin - Plan: For home use only DME Other see comment  Abnormal gait - Plan: For home use only DME Other see comment, sliding chair, rollator  Home PT/OT Dizziness - Plan: Ambulatory referral to ENT Tinnitus, - Plan: Ambulatory referral to ENT  Physical deconditioning Home PT/OT  AKI (acute kidney injury)  ([place])  Bmet in 2 weeks HM Flu shot utd  Tdap utd  prevnar utd pna 23 vaccine covid vx 2/2 covid 19 + 11/06/19 declines booster had 2/2 shingrix vaccines     DRE in future PSA 12/16/2020 2.91   Colonoscopy 03/15/21 poor prep will be rescheduled Dr. Allen Madariaga  colonoscopy had 11/23/15 moderate to severe diverticulosis 2 polyps no pathology report noted Dr. Rexene Alberts [facility] requested records for pathology report  -rec repeat in 7 years with h/o polyps ? Need pathology per pt told 10 years  No FH colon cancer   Hep C neg    dermatology saw Dr. Kellie Moor tbse multiple nevi due to see again 02/15/2018 saw cyst removal back 03/07/2018 sch currently 02/2020 will see if can seen sooner  02/2019 nl tbse  02/13/20 dermatology ln2 check resolved   sch f/u 03/2021    Eye exam AE 08/13/20 negative    rec healthy diet and exercise    CT chest below 03/09/21  Stable solid 7 mm nodule left lower lobe when compared to the study in June 2022. Etiology remains indeterminate and continued surveillance with repeat CT in 6 months suggested.  Moderate multivessel coronary artery calcifications, as above.   08/17/20 1. Multiple subcentimeter pulmonary nodules. The largest these lesions measures approximately 7 mm and is located in the lower lobe of the left lung (image 29 series 4). Recommend correlation with any prior CT imaging of the chest and/or follow-up chest CT in 4-6 months per the 2017 Fleischner society guidelines (pulmonary nodules detected incidentally in patients >35 years on non-screening CT).   2. Notable incidental findings include:  -Moderate burden of coronary artery calcification  -Small hiatal hernia     [facility]  [facility] Carilyn [facility] number is [phone] Fax [phone]   Provider: Dr. Olivia Mackie McLean-Scocuzza-[facility]           Derm seen 02/2021  Provider: Dr. Olivia Mackie McLean-Scocuzza-[facility]

## 2021-04-07 NOTE — Patient Instructions (Addendum)
Diabetes Mellitus and Nutrition, Adult When you have diabetes, or diabetes mellitus, it is very important to have healthy eating habits because your blood sugar (glucose) levels are greatly affected by what you eat and drink. Eating healthy foods in the right amounts, at about the same times every day, can help you: Manage your blood glucose. Lower your risk of heart disease. Improve your blood pressure. Reach or maintain a healthy weight. What can affect my meal plan? Every person with diabetes is different, and each person has different needs for a meal plan. Your health care provider may recommend that you work with a dietitian to make a meal plan that is best for you. Your meal plan may vary depending on factors such as: The calories you need. The medicines you take. Your weight. Your blood glucose, blood pressure, and cholesterol levels. Your activity level. Other health conditions you have, such as heart or kidney disease. How do carbohydrates affect me? Carbohydrates, also called carbs, affect your blood glucose level more than any other type of food. Eating carbs raises the amount of glucose in your blood. It is important to know how many carbs you can safely have in each meal. This is different for every person. Your dietitian can help you calculate how many carbs you should have at each meal and for each snack. How does alcohol affect me? Alcohol can cause a decrease in blood glucose (hypoglycemia), especially if you use insulin or take certain diabetes medicines by mouth. Hypoglycemia can be a life-threatening condition. Symptoms of hypoglycemia, such as sleepiness, dizziness, and confusion, are similar to symptoms of having too much alcohol. Do not drink alcohol if: Your health care provider tells you not to drink. You are pregnant, may be pregnant, or are planning to become pregnant. If you drink alcohol: Limit how much you have to: 0-1 drink a day for women. 0-2 drinks a day  for men. Know how much alcohol is in your drink. In the [place] one drink equals one 12 oz bottle of beer (355 mL), one 5 oz glass of wine (148 mL), or one 1 oz glass of hard liquor (44 mL). Keep yourself hydrated with water, diet soda, or unsweetened iced tea. Keep in mind that regular soda, juice, and other mixers may contain a lot of sugar and must be counted as carbs. What are tips for following this plan? Reading food labels Start by checking the serving size on the Nutrition Facts label of packaged foods and drinks. The number of calories and the amount of carbs, fats, and other nutrients listed on the label are based on one serving of the item. Many items contain more than one serving per package. Check the total grams (g) of carbs in one serving. Check the number of grams of saturated fats and trans fats in one serving. Choose foods that have a low amount or none of these fats. Check the number of milligrams (mg) of salt (sodium) in one serving. Most people should limit total sodium intake to less than 2,300 mg per day. Always check the nutrition information of foods labeled as "low-fat" or "nonfat." These foods may be higher in added sugar or refined carbs and should be avoided. Talk to your dietitian to identify your daily goals for nutrients listed on the label. Shopping Avoid buying canned, pre-made, or processed foods. These foods tend to be high in fat, sodium, and added sugar. Shop around the outside edge of the grocery store. This is where you will  most often find fresh fruits and vegetables, bulk grains, fresh meats, and fresh dairy products. Cooking Use low-heat cooking methods, such as baking, instead of high-heat cooking methods, such as deep frying. Cook using healthy oils, such as olive, canola, or sunflower oil. Avoid cooking with butter, cream, or high-fat meats. Meal planning Eat meals and snacks regularly, preferably at the same times every day. Avoid going long periods of  time without eating. Eat foods that are high in fiber, such as fresh fruits, vegetables, beans, and whole grains. Eat 4-6 oz (112-168 g) of lean protein each day, such as lean meat, chicken, fish, eggs, or tofu. One ounce (oz) (28 g) of lean protein is equal to: 1 oz (28 g) of meat, chicken, or fish. 1 egg.  cup (62 g) of tofu. Eat some foods each day that contain healthy fats, such as avocado, nuts, seeds, and fish. What foods should I eat? Fruits Berries. Apples. Oranges. Peaches. Apricots. Plums. Grapes. Mangoes. Papayas. Pomegranates. Kiwi. Cherries. Vegetables Leafy greens, including lettuce, spinach, kale, chard, collard greens, mustard greens, and cabbage. Beets. Cauliflower. Broccoli. Carrots. Green beans. Tomatoes. Peppers. Onions. Cucumbers. Brussels sprouts. Grains Whole grains, such as whole-wheat or whole-grain bread, crackers, tortillas, cereal, and pasta. Unsweetened oatmeal. Quinoa. Brown or wild rice. Meats and other proteins Seafood. Poultry without skin. Lean cuts of poultry and beef. Tofu. Nuts. Seeds. Dairy Low-fat or fat-free dairy products such as milk, yogurt, and cheese. The items listed above may not be a complete list of foods and beverages you can eat and drink. Contact a dietitian for more information. What foods should I avoid? Fruits Fruits canned with syrup. Vegetables Canned vegetables. Frozen vegetables with butter or cream sauce. Grains Refined white flour and flour products such as bread, pasta, snack foods, and cereals. Avoid all processed foods. Meats and other proteins Fatty cuts of meat. Poultry with skin. Breaded or fried meats. Processed meat. Avoid saturated fats. Dairy Full-fat yogurt, cheese, or milk. Beverages Sweetened drinks, such as soda or iced tea. The items listed above may not be a complete list of foods and beverages you should avoid. Contact a dietitian for more information. Questions to ask a health care provider Do I need to  meet with a certified diabetes care and education specialist? Do I need to meet with a dietitian? What number can I call if I have questions? When are the best times to check my blood glucose? Where to find more information: American Diabetes Association: diabetes.org Academy of Nutrition and Dietetics: eatright.[employer] of Diabetes and Digestive and Kidney Diseases: Amen[employer].is Association of Diabetes Care & Education Specialists: diabeteseducator.org Summary It is important to have healthy eating habits because your blood sugar (glucose) levels are greatly affected by what you eat and drink. It is important to use alcohol carefully. A healthy meal plan will help you manage your blood glucose and lower your risk of heart disease. Your health care provider may recommend that you work with a dietitian to make a meal plan that is best for you. This information is not intended to replace advice given to you by your health care provider. Make sure you discuss any questions you have with your health care provider. Document Revised: 08/28/2019 Document Reviewed: 08/28/2019 Elsevier Patient Education  [place].  Orthostatic Hypotension Blood pressure is a measurement of how strongly, or weakly, your circulating blood is pressing against the walls of your arteries. Orthostatic hypotension is a drop in blood pressure that can happen when you  change positions, such as when you go from lying down to standing. Arteries are blood vessels that carry blood from your heart throughout your body. When blood pressure is too low, you may not get enough blood to your brain or to the rest of your organs. Orthostatic hypotension can cause light-headedness, sweating, rapid heartbeat, blurred vision, and fainting. These symptoms require further investigation into the cause. What are the causes? Orthostatic hypotension can be caused by many things, including: Sudden changes in posture, such as  standing up quickly after you have been sitting or lying down. Loss of blood (anemia) or loss of body fluids (dehydration). Heart problems, neurologic problems, or hormone problems. Pregnancy. Aging. The risk for this condition increases as you get older. Severe infection (sepsis). Certain medicines, such as medicines for high blood pressure or medicines that make the body lose excess fluids (diuretics). What are the signs or symptoms? Symptoms of this condition may include: Weakness, light-headedness, or dizziness. Sweating. Blurred vision. Tiredness (fatigue). Rapid heartbeat. Fainting, in severe cases. How is this diagnosed? This condition is diagnosed based on: Your symptoms and medical history. Your blood pressure measurements. Your health care provider will check your blood pressure when you are: Lying down. Sitting. Standing. A blood pressure reading is recorded as two numbers, such as "120 over 80" (or 120/80). The first ("top") number is called the systolic pressure. It is a measure of the pressure in your arteries as your heart beats. The second ("bottom") number is called the diastolic pressure. It is a measure of the pressure in your arteries when your heart relaxes between beats. Blood pressure is measured in a unit called mmHg. Healthy blood pressure for most adults is 120/80 mmHg. Orthostatic hypotension is defined as a 20 mmHg drop in systolic pressure or a 10 mmHg drop in diastolic pressure within 3 minutes of standing. Other information or tests that may be used to diagnose orthostatic hypotension include: Your other vital signs, such as your heart rate and temperature. Blood tests. An electrocardiogram (ECG) or echocardiogram. A Holter monitor. This is a device you wear that records your heart rhythm continuously, usually for 24-48 hours. Tilt table test. For this test, you will be safely secured to a table that moves you from a lying position to an upright position.  Your heart rhythm and blood pressure will be monitored during the test. How is this treated? This condition may be treated by: Changing your diet. This may involve eating more salt (sodium) or drinking more water. Changing the dosage of certain medicines you are taking that might be lowering your blood pressure. Correcting the underlying reason for the orthostatic hypotension. Wearing compression stockings. Taking medicines to raise your blood pressure. Avoiding actions that trigger symptoms. Follow these instructions at home: Medicines Take over-the-counter and prescription medicines only as told by your health care provider. Follow instructions from your health care provider about changing the dosage of your current medicines, if this applies. Do not stop or adjust any of your medicines on your own. Eating and drinking  Drink enough fluid to keep your urine pale yellow. Eat extra salt only as directed. Do not add extra salt to your diet unless advised by your health care provider. Eat frequent, small meals. Avoid standing up suddenly after eating. General instructions  Get up slowly from lying down or sitting positions. This gives your blood pressure a chance to adjust. Avoid hot showers and excessive heat as directed by your health care provider. Engage in regular physical  activity as directed by your health care provider. If you have compression stockings, wear them as told. Keep all follow-up visits. This is important. Contact a health care provider if: You have a fever for more than 2-3 days. You feel more thirsty than usual. You feel dizzy or weak. Get help right away if: You have chest pain. You have a fast or irregular heartbeat. You become sweaty or feel light-headed. You feel short of breath. You faint. You have any symptoms of a stroke. "BE FAST" is an easy way to remember the main warning signs of a stroke: B - Balance. Signs are dizziness, sudden trouble walking,  or loss of balance. E - Eyes. Signs are trouble seeing or a sudden change in vision. F - Face. Signs are sudden weakness or numbness of the face, or the face or eyelid drooping on one side. A - Arms. Signs are weakness or numbness in an arm. This happens suddenly and usually on one side of the body. S - Speech. Signs are sudden trouble speaking, slurred speech, or trouble understanding what people say. T - Time. Time to call emergency services. Write down what time symptoms started. You have other signs of a stroke, such as: A sudden, severe headache with no known cause. Nausea or vomiting. Seizure. These symptoms may represent a serious problem that is an emergency. Do not wait to see if the symptoms will go away. Get medical help right away. Call your local emergency services (911 in the [place] Do not drive yourself to the hospital. Summary Orthostatic hypotension is a sudden drop in blood pressure. It can cause light-headedness, sweating, rapid heartbeat, blurred vision, and fainting. Orthostatic hypotension can be diagnosed by having your blood pressure taken while lying down, sitting, and then standing. Treatment may involve changing your diet, wearing compression stockings, sitting up slowly, adjusting your medicines, or correcting the underlying reason for the orthostatic hypotension. Get help right away if you have chest pain, a fast or irregular heartbeat, or symptoms of a stroke. This information is not intended to replace advice given to you by your health care provider. Make sure you discuss any questions you have with your health care provider. Document Revised: 04/09/2020 Document Reviewed: 04/09/2020 Elsevier Patient Education  2022 [place].  Dr. Redmond Baseman   Phone Fax E-mail Address  [phone] Not available [address]   [place] [address]     Specialties     Otolaryngology      [facility] in [place], [place] Service options: In-store shopping Address: [address], [place], [place] [address] Hours:  Open ? Closes 5:30?PM Phone: [phone]    Hold jardiance, metformin, prazosin   Tinnitus Tinnitus refers to hearing a sound when there is no actual source for that sound. This is often described as ringing in the ears. However, people with this condition may hear a variety of noises, in one ear or in both ears. The sounds of tinnitus can be soft, loud, or somewhere in between. Tinnitus can last for a few seconds or can be constant for days. It may go away without treatment and come back at various times. When tinnitus is constant or happens often, it can lead to other problems, such as trouble sleeping and trouble concentrating. Almost everyone experiences tinnitus at some point. Tinnitus is not the same as hearing loss. Tinnitus that is long-lasting (chronic) or comes back  often (recurs) may require medical attention. What are the causes? The cause of tinnitus is often not known. In some cases, it can result from: Exposure to loud noises from machinery, music, or other sources. An object (foreign body) stuck in the ear. Earwax buildup. Drinking alcohol or caffeine. Taking certain medicines. Age-related hearing loss. It may also be caused by medical conditions such as: Ear or sinus infections. Heart diseases or high blood pressure. Allergies. Mnire's disease. Thyroid problems. Tumors. A weak, bulging blood vessel (aneurysm) near the ear. What increases the risk? The following factors may make you more likely to develop this condition: Exposure to loud noises. Age. Tinnitus is more likely in older individuals. Using alcohol or tobacco. What are the signs or symptoms? The main symptom of tinnitus is hearing a sound when there is no source for that sound. It may sound like: Buzzing. Sizzling. Ringing. Blowing  air. Hissing. Whistling. Other sounds may include: Roaring. Running water. A musical note. Tapping. Humming. Symptoms may affect only one ear (unilateral) or both ears (bilateral). How is this diagnosed? Tinnitus is diagnosed based on your symptoms, your medical history, and a physical exam. Your health care provider may do a thorough hearing test (audiologic exam) if your tinnitus: Is unilateral. Causes hearing difficulties. Lasts 6 months or longer. You may work with a health care provider who specializes in hearing disorders (audiologist). You may be asked questions about your symptoms and how they affect your daily life. You may have other tests done, such as: CT scan. MRI. An imaging test of how blood flows through your blood vessels (angiogram). How is this treated? Treating an underlying medical condition can sometimes make tinnitus go away. If your tinnitus continues, other treatments may include: Therapy and counseling to help you manage the stress of living with tinnitus. Sound generators to mask the tinnitus. These include: Tabletop sound machines that play relaxing sounds to help you fall asleep. Wearable devices that fit in your ear and play sounds or music. Acoustic neural stimulation. This involves using headphones to listen to music that contains an auditory signal. Over time, listening to this signal may change some pathways in your brain and make you less sensitive to tinnitus. This treatment is used for very severe cases when no other treatment is working. Using hearing aids or cochlear implants if your tinnitus is related to hearing loss. Hearing aids are worn in the outer ear. Cochlear implants are surgically placed in the inner ear. Follow these instructions at home: Managing symptoms   When possible, avoid being in loud places and being exposed to loud sounds. Wear hearing protection, such as earplugs, when you are exposed to loud noises. Use a white noise  machine, a humidifier, or other devices to mask the sound of tinnitus. Practice techniques for reducing stress, such as meditation, yoga, or deep breathing. Work with your health care provider if you need help with managing stress. Sleep with your head slightly raised. This may reduce the impact of tinnitus. General instructions Do not use stimulants, such as nicotine, alcohol, or caffeine. Talk with your health care provider about other stimulants to avoid. Stimulants are substances that can make you feel alert and attentive by increasing certain activities in the body (such as heart rate and blood pressure). These substances may make tinnitus worse. Take over-the-counter and prescription medicines only as told by your health care provider. Try to get plenty of sleep each night. Keep all follow-up visits. This is important. Contact a health care  provider if: Your tinnitus continues for 3 weeks or longer without stopping. You develop sudden hearing loss. Your symptoms get worse or do not get better with home care. You feel you are not able to manage the stress of living with tinnitus. Get help right away if: You develop tinnitus after a head injury. You have tinnitus along with any of the following: Dizziness. Nausea and vomiting. Loss of balance. Sudden, severe headache. Vision changes. Facial weakness or weakness of arms or legs. These symptoms may represent a serious problem that is an emergency. Do not wait to see if the symptoms will go away. Get medical help right away. Call your local emergency services (911 in the [place] Do not drive yourself to the hospital. Summary Tinnitus refers to hearing a sound when there is no actual source for that sound. This is often described as ringing in the ears. Symptoms may affect only one ear (unilateral) or both ears (bilateral). Use a white noise machine, a humidifier, or other devices to mask the sound of tinnitus. Do not use stimulants, such  as nicotine, alcohol, or caffeine. These substances may make tinnitus worse. This information is not intended to replace advice given to you by your health care provider. Make sure you discuss any questions you have with your health care provider. Document Revised: 12/30/2019 Document Reviewed: 12/30/2019 Elsevier Patient Education  2022 [employer].

## 2021-04-08 ENCOUNTER — Encounter: Payer: Self-pay | Admitting: Internal Medicine

## 2021-04-08 NOTE — Telephone Encounter (Signed)
Called Meredith with [employer]. lmtcb ?

## 2021-04-09 ENCOUNTER — Telehealth: Payer: Self-pay | Admitting: Internal Medicine

## 2021-04-09 DIAGNOSIS — Z8616 Personal history of COVID-19: Secondary | ICD-10-CM | POA: Diagnosis not present

## 2021-04-09 DIAGNOSIS — F4310 Post-traumatic stress disorder, unspecified: Secondary | ICD-10-CM | POA: Diagnosis not present

## 2021-04-09 DIAGNOSIS — R42 Dizziness and giddiness: Secondary | ICD-10-CM | POA: Diagnosis not present

## 2021-04-09 DIAGNOSIS — F419 Anxiety disorder, unspecified: Secondary | ICD-10-CM | POA: Diagnosis not present

## 2021-04-09 DIAGNOSIS — G4733 Obstructive sleep apnea (adult) (pediatric): Secondary | ICD-10-CM | POA: Diagnosis not present

## 2021-04-09 DIAGNOSIS — G8929 Other chronic pain: Secondary | ICD-10-CM | POA: Diagnosis not present

## 2021-04-09 DIAGNOSIS — J449 Chronic obstructive pulmonary disease, unspecified: Secondary | ICD-10-CM | POA: Diagnosis not present

## 2021-04-09 DIAGNOSIS — E1122 Type 2 diabetes mellitus with diabetic chronic kidney disease: Secondary | ICD-10-CM | POA: Diagnosis not present

## 2021-04-09 DIAGNOSIS — I110 Hypertensive heart disease with heart failure: Secondary | ICD-10-CM | POA: Diagnosis not present

## 2021-04-09 DIAGNOSIS — F32A Depression, unspecified: Secondary | ICD-10-CM | POA: Diagnosis not present

## 2021-04-09 DIAGNOSIS — Z9181 History of falling: Secondary | ICD-10-CM | POA: Diagnosis not present

## 2021-04-09 DIAGNOSIS — E1140 Type 2 diabetes mellitus with diabetic neuropathy, unspecified: Secondary | ICD-10-CM | POA: Diagnosis not present

## 2021-04-09 DIAGNOSIS — I2510 Atherosclerotic heart disease of native coronary artery without angina pectoris: Secondary | ICD-10-CM | POA: Diagnosis not present

## 2021-04-09 DIAGNOSIS — Z87891 Personal history of nicotine dependence: Secondary | ICD-10-CM | POA: Diagnosis not present

## 2021-04-09 DIAGNOSIS — I5032 Chronic diastolic (congestive) heart failure: Secondary | ICD-10-CM | POA: Diagnosis not present

## 2021-04-09 DIAGNOSIS — R55 Syncope and collapse: Secondary | ICD-10-CM

## 2021-04-09 DIAGNOSIS — E785 Hyperlipidemia, unspecified: Secondary | ICD-10-CM | POA: Diagnosis not present

## 2021-04-09 DIAGNOSIS — N1831 Chronic kidney disease, stage 3a: Secondary | ICD-10-CM | POA: Diagnosis not present

## 2021-04-09 DIAGNOSIS — N179 Acute kidney failure, unspecified: Secondary | ICD-10-CM | POA: Diagnosis not present

## 2021-04-09 NOTE — Telephone Encounter (Signed)
Get sample of ozempic 0.25 weekly  ?Still have not heard from [place] Dr. Kelton Pillar ?Call wife to pick up  ? ?

## 2021-04-10 DIAGNOSIS — R55 Syncope and collapse: Secondary | ICD-10-CM | POA: Diagnosis not present

## 2021-04-12 ENCOUNTER — Telehealth: Payer: Self-pay | Admitting: Internal Medicine

## 2021-04-12 NOTE — Telephone Encounter (Signed)
Printed instructions and placed with medication for pick up. Patient has seen mychart message informing him to pick up the medication  ?

## 2021-04-12 NOTE — Telephone Encounter (Signed)
Pt will need teaching instructions for ozempic ?

## 2021-04-12 NOTE — Telephone Encounter (Signed)
Called [employer] and spoke with Eustace. ?HOLD has been placed on the original zio AT and the patient will not be billed. ?I have placed the monitor in our outgoing mail to be mailed back to [employer]. ? ? ?

## 2021-04-12 NOTE — Telephone Encounter (Signed)
Please see 04/08/21 Patient message encounter  ?

## 2021-04-12 NOTE — Telephone Encounter (Signed)
McLean-Scocuzza, Nino Glow, MD ?  ?TM ?  10:31 AM ?Note ?Get sample of ozempic 0.25 weekly  ?Still have not heard from [place] Dr. Kelton Pillar ?Call wife to pick up   ?  ? ?

## 2021-04-14 DIAGNOSIS — R42 Dizziness and giddiness: Secondary | ICD-10-CM | POA: Diagnosis not present

## 2021-04-14 DIAGNOSIS — N179 Acute kidney failure, unspecified: Secondary | ICD-10-CM | POA: Diagnosis not present

## 2021-04-14 DIAGNOSIS — I5032 Chronic diastolic (congestive) heart failure: Secondary | ICD-10-CM | POA: Diagnosis not present

## 2021-04-14 DIAGNOSIS — N1831 Chronic kidney disease, stage 3a: Secondary | ICD-10-CM | POA: Diagnosis not present

## 2021-04-14 DIAGNOSIS — E1122 Type 2 diabetes mellitus with diabetic chronic kidney disease: Secondary | ICD-10-CM | POA: Diagnosis not present

## 2021-04-14 DIAGNOSIS — I110 Hypertensive heart disease with heart failure: Secondary | ICD-10-CM | POA: Diagnosis not present

## 2021-04-15 ENCOUNTER — Encounter: Payer: Self-pay | Admitting: Internal Medicine

## 2021-04-16 ENCOUNTER — Encounter: Payer: Self-pay | Admitting: Internal Medicine

## 2021-04-16 DIAGNOSIS — I110 Hypertensive heart disease with heart failure: Secondary | ICD-10-CM | POA: Diagnosis not present

## 2021-04-16 DIAGNOSIS — N1831 Chronic kidney disease, stage 3a: Secondary | ICD-10-CM | POA: Diagnosis not present

## 2021-04-16 DIAGNOSIS — E1122 Type 2 diabetes mellitus with diabetic chronic kidney disease: Secondary | ICD-10-CM | POA: Diagnosis not present

## 2021-04-16 DIAGNOSIS — N179 Acute kidney failure, unspecified: Secondary | ICD-10-CM | POA: Diagnosis not present

## 2021-04-16 DIAGNOSIS — R42 Dizziness and giddiness: Secondary | ICD-10-CM | POA: Diagnosis not present

## 2021-04-16 DIAGNOSIS — I5032 Chronic diastolic (congestive) heart failure: Secondary | ICD-10-CM | POA: Diagnosis not present

## 2021-04-19 DIAGNOSIS — N1831 Chronic kidney disease, stage 3a: Secondary | ICD-10-CM | POA: Diagnosis not present

## 2021-04-19 DIAGNOSIS — N179 Acute kidney failure, unspecified: Secondary | ICD-10-CM | POA: Diagnosis not present

## 2021-04-19 DIAGNOSIS — I110 Hypertensive heart disease with heart failure: Secondary | ICD-10-CM | POA: Diagnosis not present

## 2021-04-19 DIAGNOSIS — E1122 Type 2 diabetes mellitus with diabetic chronic kidney disease: Secondary | ICD-10-CM | POA: Diagnosis not present

## 2021-04-19 DIAGNOSIS — I5032 Chronic diastolic (congestive) heart failure: Secondary | ICD-10-CM | POA: Diagnosis not present

## 2021-04-19 DIAGNOSIS — R42 Dizziness and giddiness: Secondary | ICD-10-CM | POA: Diagnosis not present

## 2021-04-19 NOTE — Telephone Encounter (Signed)
Message added to 04/16/21 Patient message encounter  ?

## 2021-04-19 NOTE — Telephone Encounter (Signed)
For your information  

## 2021-04-19 NOTE — Telephone Encounter (Signed)
April 15, 2021 ?Shirlyn Goltz ?to [facility] (supporting McLean-Scocuzza, Nino Glow, MD)   ?   9:44 AM ?Has Dr Olivia Mackie and Dr Kelton Pillar discussed the ozempic shot< ?I have not started yet until I hear from them ?  ?Andrew Gonzalez ? ?Please advise ?

## 2021-04-21 ENCOUNTER — Telehealth: Payer: Self-pay | Admitting: Internal Medicine

## 2021-04-21 ENCOUNTER — Other Ambulatory Visit: Payer: TRICARE For Life (TFL)

## 2021-04-21 NOTE — Telephone Encounter (Signed)
Tiffany calling from [facility] about orders being sent for 3/6 and 3/10 . They have not received the orders yet. I let her know that we have a new process of how we receive our faxes and it may have came and I will ask my manager if it did.  ?

## 2021-04-22 DIAGNOSIS — N179 Acute kidney failure, unspecified: Secondary | ICD-10-CM | POA: Diagnosis not present

## 2021-04-22 DIAGNOSIS — N1831 Chronic kidney disease, stage 3a: Secondary | ICD-10-CM | POA: Diagnosis not present

## 2021-04-22 DIAGNOSIS — I110 Hypertensive heart disease with heart failure: Secondary | ICD-10-CM | POA: Diagnosis not present

## 2021-04-22 DIAGNOSIS — R42 Dizziness and giddiness: Secondary | ICD-10-CM | POA: Diagnosis not present

## 2021-04-22 DIAGNOSIS — I5032 Chronic diastolic (congestive) heart failure: Secondary | ICD-10-CM | POA: Diagnosis not present

## 2021-04-22 DIAGNOSIS — E1122 Type 2 diabetes mellitus with diabetic chronic kidney disease: Secondary | ICD-10-CM | POA: Diagnosis not present

## 2021-04-26 DIAGNOSIS — N1831 Chronic kidney disease, stage 3a: Secondary | ICD-10-CM | POA: Diagnosis not present

## 2021-04-26 DIAGNOSIS — I110 Hypertensive heart disease with heart failure: Secondary | ICD-10-CM | POA: Diagnosis not present

## 2021-04-26 DIAGNOSIS — R42 Dizziness and giddiness: Secondary | ICD-10-CM | POA: Diagnosis not present

## 2021-04-26 DIAGNOSIS — E1122 Type 2 diabetes mellitus with diabetic chronic kidney disease: Secondary | ICD-10-CM | POA: Diagnosis not present

## 2021-04-26 DIAGNOSIS — N179 Acute kidney failure, unspecified: Secondary | ICD-10-CM | POA: Diagnosis not present

## 2021-04-26 DIAGNOSIS — I5032 Chronic diastolic (congestive) heart failure: Secondary | ICD-10-CM | POA: Diagnosis not present

## 2021-04-27 DIAGNOSIS — M545 Low back pain, unspecified: Secondary | ICD-10-CM | POA: Diagnosis not present

## 2021-04-27 DIAGNOSIS — M533 Sacrococcygeal disorders, not elsewhere classified: Secondary | ICD-10-CM | POA: Diagnosis not present

## 2021-04-27 DIAGNOSIS — M47816 Spondylosis without myelopathy or radiculopathy, lumbar region: Secondary | ICD-10-CM | POA: Diagnosis not present

## 2021-04-27 DIAGNOSIS — M47896 Other spondylosis, lumbar region: Secondary | ICD-10-CM | POA: Diagnosis not present

## 2021-04-27 DIAGNOSIS — M5416 Radiculopathy, lumbar region: Secondary | ICD-10-CM | POA: Diagnosis not present

## 2021-04-27 DIAGNOSIS — M4316 Spondylolisthesis, lumbar region: Secondary | ICD-10-CM | POA: Diagnosis not present

## 2021-04-29 ENCOUNTER — Telehealth: Payer: Self-pay | Admitting: *Deleted

## 2021-04-29 NOTE — Telephone Encounter (Signed)
As discussed during Mr. Natter' recent hospital visit, our recommendation is to refrain from driving for 6 months from the time of a syncopal event.  While the recent event monitor did not show any significant arrhythmias to explain his syncope, it does not obviate the need to refrain from driving as previously discussed.  Furthermore, additional testing may be needed to exclude other potential cardiac causes of his syncope.  These will be discussed at his follow-up visit with Christell Faith, PA, next week. ? ?Nelva Bush, MD ?[facility] ? ?

## 2021-04-29 NOTE — Telephone Encounter (Signed)
-----   Message from Lamar Laundry, RN sent at 04/28/2021  2:29 PM EDT ----- ? ?----- Message ----- ?From: Rise Mu, PA-C ?Sent: 04/28/2021   1:50 PM EDT ?To: [facility] ? ?Outpatient cardiac monitor showed a predominant rhythm of sinus with an average rate of 79 bpm (range 61 to 134 bpm in sinus), 2 episodes of NSVT lasting up to 13 beats, 8 atrial runs lasting up to 15 beats, no sustained arrhythmias or prolonged pauses, no patient triggered events, and rare PACs/PVCs. ? ?No evidence of significant heart arrhythmia contributing to his recent hospital admission.  Follow-up as planned later this month to discuss further testing. ? ?

## 2021-04-29 NOTE — Telephone Encounter (Signed)
Left voicemail message for patient to call back for review of results  

## 2021-04-29 NOTE — Telephone Encounter (Signed)
Reviewed results and recommendations with patient. He inquired if he can now drive stating that Dr. Saunders Revel had told him to not drive for 6 months. He requested for me to check with provider to see if he can now drive. I reviewed chart and do not see it mentioned. Advised I would follow up with provider and give him a call back. He was appreciative for the call with no further questions.  ?

## 2021-04-29 NOTE — Telephone Encounter (Signed)
Patient is returning your call.  

## 2021-04-30 ENCOUNTER — Ambulatory Visit: Payer: TRICARE For Life (TFL) | Admitting: Physician Assistant

## 2021-04-30 NOTE — Telephone Encounter (Signed)
Reviewed provider recommendations that he is still not able to drive for 6 months. He verbalized understanding with no further questions at this time.  ?

## 2021-05-03 DIAGNOSIS — I110 Hypertensive heart disease with heart failure: Secondary | ICD-10-CM | POA: Diagnosis not present

## 2021-05-03 DIAGNOSIS — N1831 Chronic kidney disease, stage 3a: Secondary | ICD-10-CM | POA: Diagnosis not present

## 2021-05-03 DIAGNOSIS — I5032 Chronic diastolic (congestive) heart failure: Secondary | ICD-10-CM | POA: Diagnosis not present

## 2021-05-03 DIAGNOSIS — R42 Dizziness and giddiness: Secondary | ICD-10-CM | POA: Diagnosis not present

## 2021-05-03 DIAGNOSIS — E1122 Type 2 diabetes mellitus with diabetic chronic kidney disease: Secondary | ICD-10-CM | POA: Diagnosis not present

## 2021-05-03 DIAGNOSIS — N179 Acute kidney failure, unspecified: Secondary | ICD-10-CM | POA: Diagnosis not present

## 2021-05-03 DIAGNOSIS — L728 Other follicular cysts of the skin and subcutaneous tissue: Secondary | ICD-10-CM | POA: Diagnosis not present

## 2021-05-03 NOTE — Telephone Encounter (Signed)
Do we have these orders? ?

## 2021-05-03 NOTE — Progress Notes (Signed)
? ?Cardiology Office Note   ? ?Date:  05/07/2021  ? ?ID:  Andrew Gonzalez, DOB 1950/04/20, MRN [id number] ? ?PCP:  Andrew, Nino Glow, MD  ?Cardiologist:  Andrew Bush, MD  ?Electrophysiologist:  None  ? ?Chief Complaint: Hospital follow-up ? ?History of Present Illness:  ? ?Andrew Gonzalez is a 71 y.o. male with history of coronary artery calcification, syncope, COVID in 10/2019, DM, CKD stage III, HTN, and HLD who presents for hospital follow-up as outlined below. ? ?[name] was evaluated in 01/2020 after he was incidentally noted to have aortic atherosclerosis on CXR. At that time, he reported some lingering SOB following his Covid infection. Lexiscan MPI in 01/2020 without evidence of ischemia with an EF of 55-65% and was overall low risk. There was mild aortic atherosclerosis and minimal coronary artery calcification noted. Aortic ultrasound showed no evidence of abdominal aortic aneurysm.  ?  ?He was seen in the office in 01/2021 and was undergoing evaluation of a bone lesion in the left humerus of uncertain significance.  Bone scan had demonstrated very subtle uptake, prompting PET/CT that was most consistent with an indolent/benign osseous lesion. His longstanding dyspnea was improving, though not resolved. Given this, he underwent echo in 02/2021 showed an EF of 60-65%, no RWMA, Gr1DD, normal RV systolic function and ventricular cavity size, mild MR, and an estimated right atrial pressure of 3 mmHg.   ? ?He was seen in the ED in 02/2021 with dizziness and a follow-up with preceding syncope.  Work-up was unrevealing. ? ?He was admitted to the hospital in 03/2021 with dizziness and near syncope.  It was noted he had recently been restarted on metformin with the addition of Jardiance the week prior.  There was also some concern for potential GI illness.  He indicated he sat down on the toilet to have a BM and became dizzy.  During this episode, he did not suffer a full LOC.  He was without associated symptoms.   Upon arrival to the ED he was noted to be dehydrated with an acute on CKD with a serum creatinine 1.74 with a baseline of 1-1.2.  Orthostatic vitals were positive.  CT head and MRI brain or reassuring.  No significant arrhythmias, high-grade AV block, or prolonged pauses were noted on telemetry.  He had symptomatic improvement with IV fluids.  Subsequent outpatient cardiac monitoring demonstrated a predominant rhythm of sinus with an average rate of 79 bpm (range 61 to 134 bpm in sinus), 2 episodes of NSVT lasting up to 13 beats with a maximal rate of 164 bpm, 8 atrial runs lasting up to 15 beats with a maximum rate of 160 bpm, rare PACs and PVCs, no sustained arrhythmias or prolonged pauses.  No patient triggered events. ? ?He comes in accompanied by his wife today and is doing well from a cardiac perspective, without symptoms of angina or decompensation.  No palpitations, dizziness, presyncope, or syncope.  No lower extremity swelling, abdominal distention, or orthopnea.  Weight is stable by our scale, down approximately 1.5 pounds when compared to his visit in 01/2021.  They do have concerns regarding his blood sugar remaining in the 160s to 170s predominantly.  He is not driving secondary to syncopal episode in 02/2021.  With this, there has been significant stress placed upon his wife as she is the sole driver for the entire family at this time.  They also have concerns regarding their current lease expiring next month with the inability for them to currently pack up  and move. ? ? ?Labs independently reviewed: ?03/2021 - potassium 4.2, BUN 29, serum creatinine 1.59, albumin 4.3, AST/ALT normal, Hgb 13.4, PLT 160, A1c 8.2 ?12/2020 - TC 129, TG 97, HDL 40, LDL 69 ?12/2019 - TSH normal ? ?Past Medical History:  ?Diagnosis Date  ? Allergy   ? Chicken pox   ? Colon polyps   ? COPD (chronic obstructive pulmonary disease) ([place])   ? COVID-19   ? 11/06/19 did not have mab infusion  ? Diabetes mellitus without complication  ([place])   ? Hyperlipidemia   ? Hypertension   ? Neuropathy   ? feet  ? PTSD (post-traumatic stress disorder)   ? Sleep apnea   ? ? ?Past Surgical History:  ?Procedure Laterality Date  ? COLONOSCOPY WITH PROPOFOL N/A 03/15/2021  ? Procedure: COLONOSCOPY WITH PROPOFOL;  Surgeon: Lucilla Lame, MD;  Location: [facility];  Service: Endoscopy;  Laterality: N/A;  Diabetic  ? TONSILLECTOMY    ? ? ?Current Medications: ?Current Meds  ?Medication Sig  ? Albuterol Sulfate (PROAIR RESPICLICK) 353 (90 Base) MCG/ACT AEPB Inhale into the lungs as needed.  ? amLODipine (NORVASC) 2.5 MG tablet Take 1 tablet (2.5 mg total) by mouth daily.  ? atorvastatin (LIPITOR) 20 MG tablet Take 1 tablet (20 mg total) by mouth daily at 6 PM.  ? cetirizine (ZYRTEC) 10 MG tablet Take 1 tablet (10 mg total) by mouth daily.  ? cholecalciferol (VITAMIN D3) 25 MCG (1000 UNIT) tablet Take 1,000 Units by mouth daily.  ? Cinnamon 500 MG TABS Take 1 tablet by mouth daily.  ? cyclobenzaprine (FLEXERIL) 5 MG tablet cyclobenzaprine 5 mg tablet ? Take ONE tab PO BID/TID PRN  ? empagliflozin (JARDIANCE) 25 MG TABS tablet 12.5 mg daily.  ? fluticasone (FLONASE) 50 MCG/ACT nasal spray Place 2 sprays into both nostrils daily.  ? glucose blood (FREESTYLE LITE) test strip Use as instructed qd to bid E11.9  ? Lancets (FREESTYLE) lancets Qd to bid Use as instructed E11.9  ? metFORMIN (GLUCOPHAGE) 1000 MG tablet Take 0.5 tablets (500 mg total) by mouth 2 (two) times daily with a meal.  ? Multiple Vitamin (MULTIVITAMIN) tablet Take 1 tablet by mouth daily.  ? mupirocin ointment (BACTROBAN) 2 % Apply 1 application topically 2 (two) times daily. To scalp wound for 1 week  ? sertraline (ZOLOFT) 100 MG tablet 100 mg 1.5 tablets daily  ? sodium chloride (OCEAN) 0.65 % SOLN nasal spray Place 1 spray into both nostrils as needed for congestion.  ? ? ?Allergies:   Bactrim [sulfamethoxazole-trimethoprim] and Biofreeze [menthol (topical analgesic)]  ? ?Social History   ? ?Socioeconomic History  ? Marital status: Married  ?  Spouse name: Not on file  ? Number of children: Not on file  ? Years of education: Not on file  ? Highest education level: Not on file  ?Occupational History  ? Not on file  ?Tobacco Use  ? Smoking status: Former  ?  Packs/day: 0.50  ?  Years: 20.00  ?  Pack years: 10.00  ?  Types: Cigarettes  ?  Quit date: 2007  ?  Years since quitting: 16.2  ? Smokeless tobacco: Never  ?Vaping Use  ? Vaping Use: Never used  ?Substance and Sexual Activity  ? Alcohol use: Not Currently  ?  Alcohol/week: 1.0 standard drink  ?  Types: 1 Standard drinks or equivalent per week  ?  Comment: occasional, maybe once a week  ? Drug use: No  ? Sexual activity: Yes  ?  Partners: Female  ?Other Topics Concern  ? Not on file  ?Social History Narrative  ? Wears selt belt   ? Safe in relationship   ? Recently moved from [facility] 2018/19 originally from [facility] dad and sister 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   ? Retired Media planner services   ? Masters degree   ? ?Social Determinants of Health  ? ?Financial Resource Strain: Low Risk   ? Difficulty of Paying Living Expenses: Not hard at all  ?Food Insecurity: No Food Insecurity  ? Worried About Charity fundraiser in the Last Year: Never true  ? Ran Out of Food in the Last Year: Never true  ?Transportation Needs: No Transportation Needs  ? Lack of Transportation (Medical): No  ? Lack of Transportation (Non-Medical): No  ?Physical Activity: Insufficiently Active  ? Days of Exercise per Week: 3 days  ? Minutes of Exercise per Session: 30 min  ?Stress: No Stress Concern Present  ? Feeling of Stress : Not at all  ?Social Connections: Unknown  ? Frequency of Communication with Friends and Family: Not on file  ? Frequency of Social Gatherings with Friends and Family: Not on file  ? Attends Religious Services: Not on file  ? Active Member of Clubs or Organizations: Not on file  ? Attends Archivist Meetings: Not on file  ? Marital Status: Married  ?  ? ?Family  History:  ?The patient's family history includes Atrial fibrillation in his sister; Cancer in his mother; Diabetes in his mother; Hypertension in his mother. ? ?ROS:   ?12-point review of system is negative as o

## 2021-05-06 DIAGNOSIS — N1831 Chronic kidney disease, stage 3a: Secondary | ICD-10-CM | POA: Diagnosis not present

## 2021-05-06 DIAGNOSIS — I5032 Chronic diastolic (congestive) heart failure: Secondary | ICD-10-CM | POA: Diagnosis not present

## 2021-05-06 DIAGNOSIS — I110 Hypertensive heart disease with heart failure: Secondary | ICD-10-CM | POA: Diagnosis not present

## 2021-05-06 DIAGNOSIS — N179 Acute kidney failure, unspecified: Secondary | ICD-10-CM | POA: Diagnosis not present

## 2021-05-06 DIAGNOSIS — R42 Dizziness and giddiness: Secondary | ICD-10-CM | POA: Diagnosis not present

## 2021-05-06 DIAGNOSIS — E1122 Type 2 diabetes mellitus with diabetic chronic kidney disease: Secondary | ICD-10-CM | POA: Diagnosis not present

## 2021-05-07 ENCOUNTER — Ambulatory Visit (INDEPENDENT_AMBULATORY_CARE_PROVIDER_SITE_OTHER): Payer: Medicare Other | Admitting: Physician Assistant

## 2021-05-07 ENCOUNTER — Encounter: Payer: Self-pay | Admitting: Physician Assistant

## 2021-05-07 VITALS — BP 120/78 | HR 93 | Ht 69.0 in | Wt 168.4 lb

## 2021-05-07 DIAGNOSIS — I471 Supraventricular tachycardia: Secondary | ICD-10-CM

## 2021-05-07 DIAGNOSIS — N183 Chronic kidney disease, stage 3 unspecified: Secondary | ICD-10-CM

## 2021-05-07 DIAGNOSIS — I2584 Coronary atherosclerosis due to calcified coronary lesion: Secondary | ICD-10-CM

## 2021-05-07 DIAGNOSIS — I10 Essential (primary) hypertension: Secondary | ICD-10-CM | POA: Diagnosis not present

## 2021-05-07 DIAGNOSIS — R0609 Other forms of dyspnea: Secondary | ICD-10-CM | POA: Diagnosis not present

## 2021-05-07 DIAGNOSIS — R55 Syncope and collapse: Secondary | ICD-10-CM | POA: Diagnosis not present

## 2021-05-07 DIAGNOSIS — I2510 Atherosclerotic heart disease of native coronary artery without angina pectoris: Secondary | ICD-10-CM

## 2021-05-07 DIAGNOSIS — I4729 Other ventricular tachycardia: Secondary | ICD-10-CM | POA: Diagnosis not present

## 2021-05-07 DIAGNOSIS — E785 Hyperlipidemia, unspecified: Secondary | ICD-10-CM | POA: Diagnosis not present

## 2021-05-07 NOTE — Patient Instructions (Signed)
Medication Instructions:  ?Your physician recommends that you continue on your current medications as directed. Please refer to the Current Medication list given to you today. ? ?*If you need a refill on your cardiac medications before your next appointment, please call your pharmacy* ? ? ?Lab Work: ?None ordered ?If you have labs (blood work) drawn today and your tests are completely normal, you will receive your results only by: ?MyChart Message (if you have MyChart) OR ?A paper copy in the mail ?If you have any lab test that is abnormal or we need to change your treatment, we will call you to review the results. ? ? ?Testing/Procedures: ? ? ?[facility] MYOVIEW  ? ?  ?Your caregiver has ordered a Stress Test with nuclear imaging. The purpose of this test is to evaluate the blood supply to your heart muscle. This procedure is referred to as a "Non-Invasive Stress Test." This is because other than having an IV started in your vein, nothing is inserted or "invades" your body. Cardiac stress tests are done to find areas of poor blood flow to the heart by determining the extent of coronary artery disease (CAD). Some patients exercise on a treadmill, which naturally increases the blood flow to your heart, while others who are  unable to walk on a treadmill due to physical limitations have a pharmacologic/chemical stress agent called Lexiscan . This medicine will mimic walking on a treadmill by temporarily increasing your coronary blood flow.   ?   ?PLEASE REPORT TO [facility] MEDICAL MALL ENTRANCE   ?THE VOLUNTEERS AT THE FIRST DESK WILL DIRECT YOU WHERE TO GO  ? ? ? ?*Please note: these test may take anywhere between 2-4 hours to complete  ? ?   ? ?Date of Procedure:_____________________________________  ? ?Arrival Time for Procedure:______________________________  ? ? ?PLEASE NOTIFY THE OFFICE AT LEAST 24 HOURS IN ADVANCE IF YOU ARE UNABLE TO KEEP YOUR APPOINTMENT.  [phone]  ?PLEASE NOTIFY NUCLEAR MEDICINE AT [facility] AT LEAST  57 HOURS IN ADVANCE IF YOU ARE UNABLE TO KEEP YOUR APPOINTMENT. [phone]  ? ?  ? ?How to prepare for your Myoview test:  ? ?     ? ?__XX__:  Hold diabetes medication the morning of procedure:  Jardiance and Metformin ?  ? ?1. Do not eat or drink after midnight  ?2. No caffeine for 24 hours prior to test  ?3. No smoking 24 hours prior to test.  ?4. Unless instructed otherwise, Take your medication with a small sips of water.    ?5.         Ladies, please do not wear dresses. Skirts or pants are appropriate. Please wear a short sleeve shirt.  ?6. No perfume, cologne or lotion.  ?7. Wear comfortable walking shoes. No heels!   ? ? ?Follow-Up: ?At [facility], you and your health needs are our priority.  As part of our continuing mission to provide you with exceptional heart care, we have created designated Provider Care Teams.  These Care Teams include your primary Cardiologist (physician) and Advanced Practice Providers (APPs -  Physician Assistants and Nurse Practitioners) who all work together to provide you with the care you need, when you need it. ? ?We recommend signing up for the patient portal called "MyChart".  Sign up information is provided on this After Visit Summary.  MyChart is used to connect with patients for Virtual Visits (Telemedicine).  Patients are able to view lab/test results, encounter notes, upcoming appointments, etc.  Non-urgent messages can be  sent to your provider as well.   ?To learn more about what you can do with MyChart, go to [email or web address].   ? ?Your next appointment:   ?Follow up after testing  ? ?The format for your next appointment:   ?In Person ? ?Provider:   ?You may see Nelva Bush, MD or one of the following Advanced Practice Providers on your designated Care Team:   ?Murray Hodgkins, NP ?Christell Faith, PA-C ?Cadence Kathlen Mody, PA-C  ? ? ?Other Instructions ? ? ?

## 2021-05-07 NOTE — Telephone Encounter (Signed)
Orders given to PCP to sign and will fax.  ?

## 2021-05-14 ENCOUNTER — Telehealth: Payer: Self-pay | Admitting: *Deleted

## 2021-05-14 ENCOUNTER — Ambulatory Visit
Admission: RE | Admit: 2021-05-14 | Discharge: 2021-05-14 | Disposition: A | Discharge status: Home / Self Care | Payer: Medicare Other | Source: Ambulatory Visit | Attending: Physician Assistant | Admitting: Physician Assistant

## 2021-05-14 DIAGNOSIS — R55 Syncope and collapse: Secondary | ICD-10-CM | POA: Insufficient documentation

## 2021-05-14 LAB — NM MYOCAR MULTI W/SPECT W/WALL MOTION / EF
Base ST Depression (mm): 0 mm
LV dias vol: 34 mL (ref 62–150)
LV sys vol: 8 mL
Nuc Stress EF: 76 %
Peak HR: 100 {beats}/min
Percent HR: 66 %
Rest HR: 71 {beats}/min
Rest Nuclear Isotope Dose: 10.2 mCi
SDS: 1
SRS: 0
SSS: 1
ST Depression (mm): 0 mm
Stress Nuclear Isotope Dose: 30.2 mCi
TID: 0.96

## 2021-05-14 MED ORDER — TECHNETIUM TC 99M TETROFOSMIN IV KIT
30.1500 | PACK | Freq: Once | INTRAVENOUS | Status: AC | PRN
Start: 2021-05-14 — End: 2021-05-14
  Administered 2021-05-14: 30.15 via INTRAVENOUS

## 2021-05-14 MED ORDER — REGADENOSON 0.4 MG/5ML IV SOLN
0.4000 mg | Freq: Once | INTRAVENOUS | Status: AC
Start: 2021-05-14 — End: 2021-05-14
  Administered 2021-05-14: 0.4 mg via INTRAVENOUS

## 2021-05-14 MED ORDER — TECHNETIUM TC 99M TETROFOSMIN IV KIT
10.0000 | PACK | Freq: Once | INTRAVENOUS | Status: AC | PRN
  Administered 2021-05-14: 10.17 via INTRAVENOUS

## 2021-05-14 NOTE — Telephone Encounter (Signed)
Left voicemail message to call back for review of results.  

## 2021-05-14 NOTE — Telephone Encounter (Signed)
Reviewed results and recommendations with patient. Confirmed next appointment. He verbalized understanding with no further questions at this time.  ?

## 2021-05-14 NOTE — Telephone Encounter (Signed)
-----   Message from Rise Mu, PA-C sent at 05/14/2021  1:35 PM EDT ----- ?Please inform the patient his stress test showed no evidence of ischemia or infarction with normal pump function.  Overall, this was a low risk study.  CT attenuated corrected images demonstrated mild aortic atherosclerosis and minimal coronary artery calcification.  Overall, reassuring study.  Continue current medical therapy and follow-up plan. ?

## 2021-05-14 NOTE — Telephone Encounter (Signed)
Pt returning nurses call regarding test results. Please advise 

## 2021-06-07 NOTE — Progress Notes (Signed)
Erroneous encounter

## 2021-06-10 ENCOUNTER — Encounter: Payer: Self-pay | Admitting: Physician Assistant

## 2021-06-10 ENCOUNTER — Ambulatory Visit (INDEPENDENT_AMBULATORY_CARE_PROVIDER_SITE_OTHER): Payer: Medicare Other | Admitting: Physician Assistant

## 2021-06-10 ENCOUNTER — Encounter: Payer: Medicare Other | Admitting: Physician Assistant

## 2021-06-10 VITALS — BP 140/72 | HR 76 | Ht 69.0 in | Wt 169.0 lb

## 2021-06-10 DIAGNOSIS — E785 Hyperlipidemia, unspecified: Secondary | ICD-10-CM

## 2021-06-10 DIAGNOSIS — R55 Syncope and collapse: Secondary | ICD-10-CM

## 2021-06-10 DIAGNOSIS — I4729 Other ventricular tachycardia: Secondary | ICD-10-CM

## 2021-06-10 DIAGNOSIS — I10 Essential (primary) hypertension: Secondary | ICD-10-CM | POA: Diagnosis not present

## 2021-06-10 DIAGNOSIS — I471 Supraventricular tachycardia: Secondary | ICD-10-CM

## 2021-06-10 DIAGNOSIS — G4730 Sleep apnea, unspecified: Secondary | ICD-10-CM | POA: Diagnosis not present

## 2021-06-10 DIAGNOSIS — I2510 Atherosclerotic heart disease of native coronary artery without angina pectoris: Secondary | ICD-10-CM | POA: Diagnosis not present

## 2021-06-10 DIAGNOSIS — N183 Chronic kidney disease, stage 3 unspecified: Secondary | ICD-10-CM

## 2021-06-10 DIAGNOSIS — I2584 Coronary atherosclerosis due to calcified coronary lesion: Secondary | ICD-10-CM | POA: Diagnosis not present

## 2021-06-10 NOTE — Patient Instructions (Addendum)
Medication Instructions:  No changes at this time.   *If you need a refill on your cardiac medications before your next appointment, please call your pharmacy*   Lab Work: None  If you have labs (blood work) drawn today and your tests are completely normal, you will receive your results only by: MyChart Message (if you have MyChart) OR A paper copy in the mail If you have any lab test that is abnormal or we need to change your treatment, we will call you to review the results.   Testing/Procedures: None   Follow-Up: At [facility], you and your health needs are our priority.  As part of our continuing mission to provide you with exceptional heart care, we have created designated Provider Care Teams.  These Care Teams include your primary Cardiologist (physician) and Advanced Practice Providers (APPs -  Physician Assistants and Nurse Practitioners) who all work together to provide you with the care you need, when you need it.   Your next appointment:   6 month(s)  The format for your next appointment:   In Person  Provider:   Christopher End, MD or Ryan Dunn, PA-C        Important Information About Sugar       

## 2021-06-10 NOTE — Patient Instructions (Signed)
Medication Instructions:  No changes at this time.   *If you need a refill on your cardiac medications before your next appointment, please call your pharmacy*   Lab Work: None  If you have labs (blood work) drawn today and your tests are completely normal, you will receive your results only by: MyChart Message (if you have MyChart) OR A paper copy in the mail If you have any lab test that is abnormal or we need to change your treatment, we will call you to review the results.   Testing/Procedures: None   Follow-Up: At [facility], you and your health needs are our priority.  As part of our continuing mission to provide you with exceptional heart care, we have created designated Provider Care Teams.  These Care Teams include your primary Cardiologist (physician) and Advanced Practice Providers (APPs -  Physician Assistants and Nurse Practitioners) who all work together to provide you with the care you need, when you need it.   Your next appointment:   6 month(s)  The format for your next appointment:   In Person  Provider:   Christopher End, MD or Ryan Dunn, PA-C        Important Information About Sugar       

## 2021-06-10 NOTE — Progress Notes (Signed)
? ?Cardiology Office Note   ? ?Date:  06/10/2021  ? ?ID:  Andrew Gonzalez, DOB Nov 04, 1950, MRN [id number] ? ?PCP:  McLean-Scocuzza, Nino Glow, MD  ?Cardiologist:  Nelva Bush, MD  ?Electrophysiologist:  None  ? ?Chief Complaint: Follow-up ? ?History of Present Illness:  ? ?Andrew Gonzalez is a 71 y.o. male with history of coronary artery calcification, syncope, COVID in 10/2019, DM, CKD stage III, HTN, HLD, and recently diagnosed sleep apnea on CPAP who presents for follow up of Lexiscan MPI. ?  ?Andrew Gonzalez was evaluated in 01/2020 after Andrew was incidentally noted to have aortic atherosclerosis on CXR. At that time, Andrew reported some lingering SOB following his Covid infection. Lexiscan MPI in 01/2020 without evidence of ischemia with an EF of 55-65% and was overall low risk. There was mild aortic atherosclerosis and minimal coronary artery calcification noted. Aortic ultrasound showed no evidence of abdominal aortic aneurysm.  ?  ?Andrew was seen in the office in 01/2021 and was undergoing evaluation of a bone lesion in the left humerus of uncertain significance.  Bone scan had demonstrated very subtle uptake, prompting PET/CT that was most consistent with an indolent/benign osseous lesion. His longstanding dyspnea was improving, though not resolved. Given this, Andrew underwent echo in 02/2021 showed an EF of 60-65%, no RWMA, Gr1DD, normal RV systolic function and ventricular cavity size, mild MR, and an estimated right atrial pressure of 3 mmHg.   ?  ?Andrew was seen in the ED in 02/2021 with dizziness and syncope.  Work-up was unrevealing. ?  ?Andrew was admitted to the hospital in 03/2021 with dizziness and near syncope.  It was noted Andrew had recently been restarted on metformin with the addition of Jardiance the week prior.  There was also some concern for potential GI illness.  Andrew indicated Andrew sat down on the toilet to have a BM and became dizzy.  During this episode, Andrew did not suffer a full LOC.  Andrew was without associated symptoms.  Upon  arrival to the ED Andrew was noted to be dehydrated with an acute on CKD with a serum creatinine 1.74 with a baseline of 1-1.2.  Orthostatic vitals were positive.  CT head and MRI brain or reassuring.  No significant arrhythmias, high-grade AV block, or prolonged pauses were noted on telemetry.  Andrew had symptomatic improvement with IV fluids.  Subsequent outpatient cardiac monitoring demonstrated a predominant rhythm of sinus with an average rate of 79 bpm (range 61 to 134 bpm in sinus), 2 episodes of NSVT lasting up to 13 beats with a maximal rate of 164 bpm, 8 atrial runs lasting up to 15 beats with a maximum rate of 160 bpm, rare PACs and PVCs, no sustained arrhythmias or prolonged pauses.  No patient triggered events. ?  ?Andrew was seen in follow up on 05/07/2021 and was without symptoms of angina or decompensation.  Andrew was without further near syncopal or syncopal episodes.  Lexiscan MPI on 05/14/2021 showed no evidence of ischemia or infarction and was overall low risk.  CT attenuated images showed mild aortic atherosclerosis and minimal coronary artery calcification.  ?  ?Andrew comes in doing very well from a cardiac perspective and is without symptoms of angina or decompensation.  No palpitations, dizziness, presyncope, or syncope.  Andrew is tolerating cardiac medications without issues.  Andrew has been diagnosed with sleep apnea and just recently received his CPAP.  Andrew is becoming acclimated to this.  His weight is stable.  Andrew continues to refrain from  driving dating back to syncopal episode in 02/2021.  This does continue to place stress upon his family. ?  ?  ?Labs independently reviewed: ?03/2021 - potassium 4.2, BUN 29, serum creatinine 1.59, albumin 4.3, AST/ALT normal, Hgb 13.4, PLT 160, A1c 8.2 ?12/2020 - TC 129, TG 97, HDL 40, LDL 69 ?12/2019 - TSH normal ? ? ?Past Medical History:  ?Diagnosis Date  ? Allergy   ? Chicken pox   ? Colon polyps   ? COPD (chronic obstructive pulmonary disease) ([place])   ? COVID-19   ? 11/06/19  did not have mab infusion  ? Diabetes mellitus without complication ([place])   ? Hyperlipidemia   ? Hypertension   ? Neuropathy   ? feet  ? PTSD (post-traumatic stress disorder)   ? Sleep apnea   ? ? ?Past Surgical History:  ?Procedure Laterality Date  ? COLONOSCOPY WITH PROPOFOL N/A 03/15/2021  ? Procedure: COLONOSCOPY WITH PROPOFOL;  Surgeon: Lucilla Lame, MD;  Location: [facility];  Service: Endoscopy;  Laterality: N/A;  Diabetic  ? TONSILLECTOMY    ? ? ?Current Medications: ?Current Meds  ?Medication Sig  ? Albuterol Sulfate (PROAIR RESPICLICK) 824 (90 Base) MCG/ACT AEPB Inhale into the lungs as needed.  ? amLODipine (NORVASC) 2.5 MG tablet Take 1 tablet (2.5 mg total) by mouth daily.  ? atorvastatin (LIPITOR) 20 MG tablet Take 1 tablet (20 mg total) by mouth daily at 6 PM.  ? cetirizine (ZYRTEC) 10 MG tablet Take 1 tablet (10 mg total) by mouth daily.  ? cholecalciferol (VITAMIN D3) 25 MCG (1000 UNIT) tablet Take 1,000 Units by mouth daily.  ? Cinnamon 500 MG TABS Take 1 tablet by mouth daily.  ? cyclobenzaprine (FLEXERIL) 5 MG tablet cyclobenzaprine 5 mg tablet ? Take ONE tab PO BID/TID PRN  ? empagliflozin (JARDIANCE) 25 MG TABS tablet 12.5 mg daily.  ? fluticasone (FLONASE) 50 MCG/ACT nasal spray Place 2 sprays into both nostrils daily.  ? glucose blood (FREESTYLE LITE) test strip Use as instructed qd to bid E11.9  ? Lancets (FREESTYLE) lancets Qd to bid Use as instructed E11.9  ? metFORMIN (GLUCOPHAGE) 1000 MG tablet Take 0.5 tablets (500 mg total) by mouth 2 (two) times daily with a meal.  ? Multiple Vitamin (MULTIVITAMIN) tablet Take 1 tablet by mouth daily.  ? mupirocin ointment (BACTROBAN) 2 % Apply 1 application topically 2 (two) times daily. To scalp wound for 1 week  ? sertraline (ZOLOFT) 100 MG tablet 100 mg 1.5 tablets daily  ? sodium chloride (OCEAN) 0.65 % SOLN nasal spray Place 1 spray into both nostrils as needed for congestion.  ? ? ?Allergies:   Bactrim [sulfamethoxazole-trimethoprim] and  Biofreeze [menthol (topical analgesic)]  ? ?Social History  ? ?Socioeconomic History  ? Marital status: Married  ?  Spouse name: Not on file  ? Number of children: Not on file  ? Years of education: Not on file  ? Highest education level: Not on file  ?Occupational History  ? Not on file  ?Tobacco Use  ? Smoking status: Former  ?  Packs/day: 0.50  ?  Years: 20.00  ?  Pack years: 10.00  ?  Types: Cigarettes  ?  Quit date: 2007  ?  Years since quitting: 16.3  ? Smokeless tobacco: Never  ?Vaping Use  ? Vaping Use: Never used  ?Substance and Sexual Activity  ? Alcohol use: Not Currently  ?  Alcohol/week: 1.0 standard drink  ?  Types: 1 Standard drinks or equivalent per week  ?  Comment: occasional, maybe once a week  ? Drug use: No  ? Sexual activity: Yes  ?  Partners: Female  ?Other Topics Concern  ? Not on file  ?Social History Narrative  ? Wears selt belt   ? Safe in relationship   ? Recently moved from [facility] 2018/19 originally from [facility] dad and sister 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   ? Retired Media planner services   ? Masters degree   ? ?Social Determinants of Health  ? ?Financial Resource Strain: Low Risk   ? Difficulty of Paying Living Expenses: Not hard at all  ?Food Insecurity: No Food Insecurity  ? Worried About Charity fundraiser in the Last Year: Never true  ? Ran Out of Food in the Last Year: Never true  ?Transportation Needs: No Transportation Needs  ? Lack of Transportation (Medical): No  ? Lack of Transportation (Non-Medical): No  ?Physical Activity: Insufficiently Active  ? Days of Exercise per Week: 3 days  ? Minutes of Exercise per Session: 30 min  ?Stress: No Stress Concern Present  ? Feeling of Stress : Not at all  ?Social Connections: Unknown  ? Frequency of Communication with Friends and Family: Not on file  ? Frequency of Social Gatherings with Friends and Family: Not on file  ? Attends Religious Services: Not on file  ? Active Member of Clubs or Organizations: Not on file  ? Attends Archivist  Meetings: Not on file  ? Marital Status: Married  ?  ? ?Family History:  ?The patient's family history includes Atrial fibrillation in his sister; Cancer in his mother; Diabetes in his mother; Hypertension

## 2021-06-17 DIAGNOSIS — E1140 Type 2 diabetes mellitus with diabetic neuropathy, unspecified: Secondary | ICD-10-CM | POA: Diagnosis not present

## 2021-06-17 DIAGNOSIS — B351 Tinea unguium: Secondary | ICD-10-CM | POA: Diagnosis not present

## 2021-07-08 ENCOUNTER — Ambulatory Visit: Payer: TRICARE For Life (TFL) | Admitting: Internal Medicine

## 2021-07-18 ENCOUNTER — Encounter: Payer: Self-pay | Admitting: Internal Medicine

## 2021-07-19 ENCOUNTER — Other Ambulatory Visit: Payer: Self-pay | Admitting: Internal Medicine

## 2021-07-19 DIAGNOSIS — E119 Type 2 diabetes mellitus without complications: Secondary | ICD-10-CM

## 2021-07-19 MED ORDER — FREESTYLE LITE TEST VI STRP: ORAL_STRIP | 12 refills | Status: DC

## 2021-07-26 ENCOUNTER — Other Ambulatory Visit: Payer: Self-pay

## 2021-07-26 DIAGNOSIS — E119 Type 2 diabetes mellitus without complications: Secondary | ICD-10-CM

## 2021-07-26 MED ORDER — FREESTYLE LITE TEST VI STRP: ORAL_STRIP | 12 refills | Status: AC

## 2021-08-18 DIAGNOSIS — H2513 Age-related nuclear cataract, bilateral: Secondary | ICD-10-CM | POA: Diagnosis not present

## 2021-08-18 DIAGNOSIS — E113293 Type 2 diabetes mellitus with mild nonproliferative diabetic retinopathy without macular edema, bilateral: Secondary | ICD-10-CM | POA: Diagnosis not present

## 2021-08-18 LAB — HM DIABETES EYE EXAM

## 2021-09-02 ENCOUNTER — Encounter: Payer: Self-pay | Admitting: Internal Medicine

## 2021-09-02 ENCOUNTER — Other Ambulatory Visit: Payer: Self-pay

## 2021-09-02 DIAGNOSIS — E119 Type 2 diabetes mellitus without complications: Secondary | ICD-10-CM

## 2021-09-02 MED ORDER — FREESTYLE LANCETS MISC: 3 refills | Status: AC

## 2021-09-20 DIAGNOSIS — B351 Tinea unguium: Secondary | ICD-10-CM | POA: Diagnosis not present

## 2021-09-20 DIAGNOSIS — E1140 Type 2 diabetes mellitus with diabetic neuropathy, unspecified: Secondary | ICD-10-CM | POA: Diagnosis not present

## 2021-09-28 ENCOUNTER — Encounter: Payer: Self-pay | Admitting: Internal Medicine

## 2021-09-28 ENCOUNTER — Ambulatory Visit (INDEPENDENT_AMBULATORY_CARE_PROVIDER_SITE_OTHER): Payer: Medicare Other | Admitting: Internal Medicine

## 2021-09-28 VITALS — BP 132/76 | HR 77 | Temp 97.4°F | Ht 69.0 in | Wt 164.0 lb

## 2021-09-28 DIAGNOSIS — E1122 Type 2 diabetes mellitus with diabetic chronic kidney disease: Secondary | ICD-10-CM

## 2021-09-28 DIAGNOSIS — Z9989 Dependence on other enabling machines and devices: Secondary | ICD-10-CM | POA: Diagnosis not present

## 2021-09-28 DIAGNOSIS — M130 Polyarthritis, unspecified: Secondary | ICD-10-CM

## 2021-09-28 DIAGNOSIS — R768 Other specified abnormal immunological findings in serum: Secondary | ICD-10-CM | POA: Diagnosis not present

## 2021-09-28 DIAGNOSIS — G4733 Obstructive sleep apnea (adult) (pediatric): Secondary | ICD-10-CM

## 2021-09-28 DIAGNOSIS — Z77098 Contact with and (suspected) exposure to other hazardous, chiefly nonmedicinal, chemicals: Secondary | ICD-10-CM | POA: Diagnosis not present

## 2021-09-28 DIAGNOSIS — N189 Chronic kidney disease, unspecified: Secondary | ICD-10-CM

## 2021-09-28 NOTE — Patient Instructions (Addendum)
Check PSA with Dr. Kelton Pillar 03/2021 Consider prevnar 20 in 03/2025  Establish with Dr. Volanda Napoleon /Scott/Tullo     If your back flares consider epidural injection in the back   Epidural Steroid Injection  An epidural steroid injection is a shot of steroid medicine, also called cortisone, and a numbing medicine that is given into the epidural space. This space is between the spinal cord and the bones of the back. This shot helps relieve pain caused by an irritated or swollen nerve root. The amount of pain relief you get from the injection depends on what is causing the nerve to be swollen and irritated, and how long your pain lasts. You may have a period of slightly more pain after your injection, before the steroid medicine takes effect. This medicine usually starts working within 1-3 days. In some cases, you might need 7-10 days to feel the full effect. Tell your health care provider about: Any allergies you have. All medicines you are taking, including vitamins, herbs, eye drops, creams, and over-the-counter medicines. Any problems you or family members have had with anesthetic medicines. Any bleeding problems you have. Any surgeries you have had. Any medical conditions you have. Whether you are pregnant or may be pregnant. What are the risks? Your health care provider will talk with you about risks. These may include: Headache. Bleeding. Infection. Allergic reaction to medicines or dyes. Nerve damage. Not being able to move (paralysis). This is rare. What happens before the procedure? Medicines You may be given medicines to lower anxiety. Ask your health care provider about: Changing or stopping your regular medicines. These include any diabetes medicines or blood thinners you take. Taking medicines such as aspirin and ibuprofen. These medicines can thin your blood. Do not take them unless your health care provider tells you to. Taking over-the-counter medicines, vitamins, herbs, and  supplements. General instructions Follow instructions from your health care provider about what you may eat and drink. Ask your health care provider what steps will be taken to help prevent infection. If you will be going home right after the procedure, plan to have a responsible adult: Take you home from the hospital or clinic. You will not be allowed to drive. Care for you for the time you are told. What happens during the procedure?  An IV will be inserted into one of your veins. You may be given a sedative. This helps you relax. You will be asked to lie on your side or sit. The injection site will be cleaned. An X-ray machine will be used to guide the needle as close as possible to the nerve causing pain. A needle will be put through your skin into the epidural space. This may cause you some discomfort. Contrast dye may be injected at the site to make sure that the steroid medicine will be sent to the exact place it needs to go. The steroid medicine and a numbing medicine (local anesthesia) will be injected into the epidural space for pain relief. The needle and IV will be removed. A bandage (dressing) will be put over the injection site. The procedure may vary among health care providers and hospitals. What happens after the procedure? Your blood pressure, heart rate, breathing rate, and blood oxygen level will be monitored until you leave the hospital or clinic. Your arm or leg may feel weak or numb for a few hours. Summary An epidural steroid injection is a shot of steroid medicine and a numbing medicine that is given into the epidural  space. The shot helps relieve pain caused by an irritated or swollen nerve root. The steroid medicine usually starts working within 1-3 days. In some cases, you might need 7-10 days to feel the full effect. This information is not intended to replace advice given to you by your health care provider. Make sure you discuss any questions you have with  your health care provider. Document Revised: 05/18/2021 Document Reviewed: 05/18/2021 Elsevier Patient Education  [place].

## 2021-09-28 NOTE — Progress Notes (Addendum)
Chief Complaint  Patient presents with   Follow-up    6 month f/u   F/u  1. C/w fibromyalgia esr and [facility] h/o ANA + will repeat labs today and consider rheum [facility]  2. Ct chest and US kidneys pending with the [facility]  3. Dm 2 on jardiance 25 mg qd actos 30 mg qd with ckd and baseline cr 1.4-1.6 will see MD at the [place] in 2022-03-26 A1c 06/2021 8.8 now 7.9 with [facility]     Review of Systems  Constitutional:  Negative for weight loss.  HENT:  Negative for hearing loss.   Eyes:  Negative for blurred vision.  Respiratory:  Negative for shortness of breath.   Cardiovascular:  Negative for chest pain.  Gastrointestinal:  Negative for abdominal pain and blood in stool.  Musculoskeletal:  Positive for back pain, joint pain and myalgias.  Skin:  Negative for rash.  Neurological:  Negative for headaches.  Psychiatric/Behavioral:  Negative for depression.    Past Medical History:  Diagnosis Date   Allergy    Chicken pox    Colon polyps    COPD (chronic obstructive pulmonary disease) ([place])    COVID-19    11/06/19 did not have mab infusion   Diabetes mellitus without complication (HCC)    Hyperlipidemia    Hypertension    Neuropathy    feet   PTSD (post-traumatic stress disorder)    Sleep apnea    Past Surgical History:  Procedure Laterality Date   COLONOSCOPY WITH PROPOFOL N/A 03/15/2021   Procedure: COLONOSCOPY WITH PROPOFOL;  Surgeon: Lucilla Lame, MD;  Location: [facility];  Service: Endoscopy;  Laterality: N/A;  Diabetic   TONSILLECTOMY     Family History  Problem Relation Age of Onset   Cancer Mother        bone, liver, lung former smoker ? lung died 03/26/08   Diabetes Mother    Hypertension Mother    Atrial fibrillation Sister    Social History   Socioeconomic History   Marital status: Married    Spouse name: Not on file   Number of children: Not on file   Years of education: Not on file   Highest education level: Not on file  Occupational History    Not on file  Tobacco Use   Smoking status: Former    Packs/day: 0.50    Years: 20.00    Total pack years: 10.00    Types: Cigarettes    Quit date: 26-Mar-2005    Years since quitting: 16.6   Smokeless tobacco: Never  Vaping Use   Vaping Use: Never used  Substance and Sexual Activity   Alcohol use: Not Currently    Alcohol/week: 1.0 standard drink of alcohol    Types: 1 Standard drinks or equivalent per week    Comment: occasional, maybe once a week   Drug use: No   Sexual activity: Yes    Partners: Female  Other Topics Concern   Not on file  Social History Narrative   Wears selt belt    Safe in relationship    Recently moved from [facility] 2018/19 originally from [facility] dad and sister 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    Retired Occupational hygienist degree    Social Determinants of Health   Financial Resource Strain: [place]  (02/09/2021)   Overall Financial Resource Strain (CARDIA)    Difficulty of Paying Living Expenses: Not hard at [place]: No [place] (  02/09/2021)   Hunger Vital Sign    Worried About Running Out of Food in the Last Year: Never true    [place] in the Last Year: Never true  Transportation Needs: No Transportation Needs (02/09/2021)   PRAPARE - Hydrologist (Medical): No    Lack of Transportation (Non-Medical): No  Physical Activity: Insufficiently Active (02/09/2021)   Exercise Vital Sign    Days of Exercise per Week: 3 days    Minutes of Exercise per Session: 30 min  Stress: No Stress Concern Present (02/09/2021)   [place]    Feeling of Stress : Not at all  Social Connections: Unknown (02/09/2021)   Social Connection and Isolation Panel [NHANES]    Frequency of Communication with Friends and Family: Not on file    Frequency of Social Gatherings with Friends and Family: Not on file    Attends Religious Services: Not on file    Active Member of Clubs or  Organizations: Not on file    Attends Archivist Meetings: Not on file    Marital Status: Married  Intimate Partner Violence: Not At Risk (02/09/2021)   Humiliation, Afraid, Rape, and Kick questionnaire    Fear of Current or Ex-Partner: No    Emotionally Abused: No    Physically Abused: No    Sexually Abused: No   Current Meds  Medication Sig   Albuterol Sulfate (PROAIR RESPICLICK) 017 (90 Base) MCG/ACT AEPB Inhale into the lungs as needed.   amLODipine (NORVASC) 2.5 MG tablet Take 1 tablet (2.5 mg total) by mouth daily.   atorvastatin (LIPITOR) 20 MG tablet Take 1 tablet (20 mg total) by mouth daily at 6 PM.   cetirizine (ZYRTEC) 10 MG tablet Take 1 tablet (10 mg total) by mouth daily.   cholecalciferol (VITAMIN D3) 25 MCG (1000 UNIT) tablet Take 1,000 Units by mouth daily.   Cinnamon 500 MG TABS Take 1 tablet by mouth daily.   cyclobenzaprine (FLEXERIL) 5 MG tablet cyclobenzaprine 5 mg tablet  Take ONE tab PO BID/TID PRN   empagliflozin (JARDIANCE) 25 MG TABS tablet Take 25 mg by mouth daily.   fluticasone (FLONASE) 50 MCG/ACT nasal spray Place 2 sprays into both nostrils daily.   glucose blood (FREESTYLE LITE) test strip Use as instructed qd to bid E11.9   Lancets (FREESTYLE) lancets Qd to bid Use as instructed E11.9   Multiple Vitamin (MULTIVITAMIN) tablet Take 1 tablet by mouth daily.   mupirocin ointment (BACTROBAN) 2 % Apply 1 application topically 2 (two) times daily. To scalp wound for 1 week   pioglitazone (ACTOS) 30 MG tablet Take 30 mg by mouth daily.   sodium chloride (OCEAN) 0.65 % SOLN nasal spray Place 1 spray into both nostrils as needed for congestion.   Allergies  Allergen Reactions   Bactrim [Sulfamethoxazole-Trimethoprim]     Red face    Biofreeze [Menthol (Topical Analgesic)]     Rash    No results found for this or any previous visit (from the past 2160 hour(s)). Objective  Body mass index is 24.22 kg/m. Wt Readings from Last 3 Encounters:   09/28/21 164 lb (74.4 kg)  06/10/21 169 lb (76.7 kg)  06/10/21 169 lb (76.7 kg)   Temp Readings from Last 3 Encounters:  09/28/21 (!) 97.4 F (36.3 C) (Oral)  04/07/21 (!) 97.5 F (36.4 C) (Oral)  04/06/21 98.6 F (37 C)   BP Readings from Last  3 Encounters:  09/28/21 132/76  06/10/21 140/72  06/10/21 140/72   Pulse Readings from Last 3 Encounters:  09/28/21 77  06/10/21 76  06/10/21 76    Physical Exam Vitals and nursing note reviewed.  Constitutional:      Appearance: Normal appearance. He is well-developed and well-groomed.  HENT:     Head: Normocephalic and atraumatic.  Eyes:     Conjunctiva/sclera: Conjunctivae normal.     Pupils: Pupils are equal, round, and reactive to light.  Cardiovascular:     Rate and Rhythm: Normal rate and regular rhythm.     Heart sounds: Normal heart sounds.  Pulmonary:     Effort: Pulmonary effort is normal. No respiratory distress.     Breath sounds: Normal breath sounds.  Abdominal:     Tenderness: There is no abdominal tenderness.  Skin:    General: Skin is warm and moist.  Neurological:     General: No focal deficit present.     Mental Status: He is alert and oriented to person, place, and time. Mental status is at baseline.     Sensory: Sensation is intact.     Motor: Motor function is intact.     Coordination: Coordination is intact.     Gait: Gait is intact. Gait normal.  Psychiatric:        Attention and Perception: Attention and perception normal.        Mood and Affect: Mood and affect normal.        Speech: Speech normal.        Behavior: Behavior normal. Behavior is cooperative.        Thought Content: Thought content normal.        Cognition and Memory: Cognition and memory normal.        Judgment: Judgment normal.     Assessment  Plan  ANA positive repeat ANA 9 months later today negative - Plan: ANA w/Reflex if Positive, Rheumatoid Factor, CYCLIC CITRUL PEPTIDE ANTIBODY, IGG/IGA Consider rheum referral in  [place] n c  Rheumatoid factor negative and [facility] ESR and CRP [facility]  ANA negative CCP negative  Likely arthritis causing pain not autoimmune but you could have negative labs and an inflammatory arthritis  Does he want to see rheumatology in [place]: ANA w/Reflex if Positive, Rheumatoid Factor, CYCLIC CITRUL PEPTIDE ANTIBODY, IGG/IGA  OSA on CPAP ha improved   Agent orange exposure on disability with the vA  Type 2 diabetes mellitus with chronic kidney disease, without long-term current use of insulin, unspecified CKD stage (HCC)  actos 30 mg qd with ckd and baseline cr 1.4-1.6 will see MD at the [place] in 03/2022 A1c 06/2021 8.8 now 7.9 with the [place]  F/u [facility] 03/2022 D.r [place]   HM Flu shot utd  Tdap utd  prevnar utd pna 23 vaccine Consider prevnar 20 in 03/2025 covid vx 2/2 covid 19 + 11/06/19 declines booster had 2/2 shingrix vaccines     DRE in future PSA 12/16/2020 2.91   Colonoscopy 03/15/21 poor prep will be rescheduled Dr. Allen Quesnell  Will get 03/2022 with the [place]    colonoscopy had 11/23/15 moderate to severe diverticulosis 2 polyps no pathology report noted Dr. Rexene Alberts [facility] requested records for pathology report  -rec repeat in 7 years with h/o polyps ? Need pathology per pt told 10 years  No FH colon cancer   Hep C neg    dermatology saw Dr. Kellie Moor tbse multiple nevi due  to see again 02/15/2018 saw cyst removal back 03/07/2018 sch currently 02/2020 will see if can seen sooner  02/2019 nl tbse  02/13/20 dermatology ln2 check resolved   sch f/u 03/2021    Eye exam AE 08/13/20 negative will get ROI    rec healthy diet and exercise    CT chest below (as of 09/2021  [facility] with [facility] again) 03/09/21  Stable solid 7 mm nodule left lower lobe when compared to the study in June 2022. Etiology remains indeterminate and continued surveillance with repeat CT in 6 months suggested.   Moderate multivessel coronary artery calcifications, as above.    08/17/20 1. Multiple subcentimeter pulmonary nodules. The largest these lesions measures approximately 7 mm and is located in the lower lobe of the left lung (image 29 series 4). Recommend correlation with any prior CT imaging of the chest and/or follow-up chest CT in 4-6 months per the 2017 Fleischner society guidelines (pulmonary nodules detected incidentally in patients >35 years on non-screening CT).   2. Notable incidental findings include:  -Moderate burden of coronary artery calcification  -Small hiatal hernia     [facility] PCP:  Dr Carilyn Goodpasture [facility] number is [phone] Fax [phone] [facility] psychiatry + therapy  [facility]   On disability with the [place] ? Agent orange exposure   Provider: Dr. Olivia Mackie McLean-Scocuzza-Internal Medicine

## 2021-09-30 ENCOUNTER — Other Ambulatory Visit: Payer: Self-pay

## 2021-09-30 ENCOUNTER — Telehealth: Payer: Self-pay

## 2021-09-30 ENCOUNTER — Encounter: Payer: Self-pay | Admitting: Internal Medicine

## 2021-09-30 DIAGNOSIS — R768 Other specified abnormal immunological findings in serum: Secondary | ICD-10-CM

## 2021-09-30 DIAGNOSIS — M130 Polyarthritis, unspecified: Secondary | ICD-10-CM

## 2021-09-30 LAB — ANA W/REFLEX IF POSITIVE: Anti Nuclear Antibody (ANA): NEGATIVE

## 2021-09-30 LAB — CYCLIC CITRUL PEPTIDE ANTIBODY, IGG/IGA: Cyclic Citrullin Peptide Ab: 2 units (ref 0–19)

## 2021-09-30 LAB — RHEUMATOID FACTOR: Rheumatoid fact SerPl-aCnc: <10 IU/mL (ref <14.0)

## 2021-09-30 NOTE — Telephone Encounter (Signed)
LMOM for pt to CB in regards to labs 

## 2021-10-04 ENCOUNTER — Encounter: Payer: Self-pay | Admitting: Internal Medicine

## 2021-10-04 NOTE — Addendum Note (Signed)
Addended by: Orland Mustard on: 10/04/2021 09:02 PM   Modules accepted: Orders

## 2021-12-11 NOTE — Progress Notes (Deleted)
Cardiology Office Note    Date:  12/11/2021   ID:  Andrew Gonzalez, DOB 1950/07/30, MRN [id number]  PCP:  Gonzalez, Andrew Glow, MD  Cardiologist:  Andrew Bush, MD  Electrophysiologist:  None   Chief Complaint: Follow-up  History of Present Illness:   Andrew Gonzalez is a 71 y.o. male with history of coronary artery calcification, syncope, COVID in 10/2019, DM, CKD stage III, HTN, HLD, and recently diagnosed sleep apnea on CPAP who presents for follow up of coronary artery calcification and presyncope/syncope.   Andrew Gonzalez was evaluated in 01/2020 after he was incidentally noted to have aortic atherosclerosis on CXR. At that time, he reported some lingering SOB following his Covid infection. Lexiscan MPI in 01/2020 without evidence of ischemia with an EF of 55-65% and was overall low risk. There was mild aortic atherosclerosis and minimal coronary artery calcification noted. Aortic ultrasound showed no evidence of abdominal aortic aneurysm.    He was seen in the office in 01/2021 and was undergoing evaluation of a bone lesion in the left humerus of uncertain significance.  Bone scan had demonstrated very subtle uptake, prompting PET/CT that was most consistent with an indolent/benign osseous lesion. His longstanding dyspnea was improving, though not resolved. Given this, he underwent echo in 02/2021 showed an EF of 60-65%, no RWMA, Gr1DD, normal RV systolic function and ventricular cavity size, mild MR, and an estimated right atrial pressure of 3 mmHg.     He was seen in the ED in 02/2021 with dizziness and syncope.  Work-up was unrevealing.   He was admitted to the hospital in 03/2021 with dizziness and near syncope.  It was noted he had recently been restarted on metformin with the addition of Jardiance the week prior.  There was also some concern for potential GI illness.  He indicated he sat down on the toilet to have a BM and became dizzy.  During this episode, he did not suffer a full LOC.  He  was without associated symptoms.  Upon arrival to the ED he was noted to be dehydrated with an acute on CKD with a serum creatinine 1.74 with a baseline of 1-1.2.  Orthostatic vitals were positive.  CT head and MRI brain or reassuring.  No significant arrhythmias, high-grade AV block, or prolonged pauses were noted on telemetry.  He had symptomatic improvement with IV fluids.  Subsequent outpatient cardiac monitoring demonstrated a predominant rhythm of sinus with an average rate of 79 bpm (range 61 to 134 bpm in sinus), 2 episodes of NSVT lasting up to 13 beats with a maximal rate of 164 bpm, 8 atrial runs lasting up to 15 beats with a maximum rate of 160 bpm, rare PACs and PVCs, no sustained arrhythmias or prolonged pauses.  No patient triggered events.   He was seen in follow up on 05/07/2021 and was without symptoms of angina or decompensation.  He was without further near syncopal or syncopal episodes.  Lexiscan MPI on 05/14/2021 showed no evidence of ischemia or infarction and was overall low risk.  CT attenuated images showed mild aortic atherosclerosis and minimal coronary artery calcification.   He was last seen in the office in 06/2021 and was without symptoms of angina or decompensation.  No further near syncopal/syncopal episodes.  ***   Labs independently reviewed: 03/2021 - potassium 4.2, BUN 29, serum creatinine 1.59, albumin 4.3, AST/ALT normal, Hgb 13.4, PLT 160, A1c 8.2 12/2020 - TC 129, TG 97, HDL 40, LDL 69 12/2019 - TSH normal  Past Medical History:  Diagnosis Date   Allergy    Chicken pox    Colon polyps    COPD (chronic obstructive pulmonary disease) ([place])    COVID-19    11/06/19 did not have mab infusion   Diabetes mellitus without complication (HCC)    Hyperlipidemia    Hypertension    Neuropathy    feet   PTSD (post-traumatic stress disorder)    Sleep apnea     Past Surgical History:  Procedure Laterality Date   COLONOSCOPY WITH PROPOFOL N/A 03/15/2021    Procedure: COLONOSCOPY WITH PROPOFOL;  Surgeon: Lucilla Lame, MD;  Location: [facility];  Service: Endoscopy;  Laterality: N/A;  Diabetic   LASIK     2003   TONSILLECTOMY      Current Medications: No outpatient medications have been marked as taking for the 12/16/21 encounter (Appointment) with Rise Mu, PA-C.    Allergies:   Bactrim [sulfamethoxazole-trimethoprim] and Biofreeze [menthol (topical analgesic)]   Social History   Socioeconomic History   Marital status: Married    Spouse name: Not on file   Number of children: Not on file   Years of education: Not on file   Highest education level: Not on file  Occupational History   Not on file  Tobacco Use   Smoking status: Former    Packs/day: 0.50    Years: 20.00    Total pack years: 10.00    Types: Cigarettes    Quit date: 2007    Years since quitting: 16.8   Smokeless tobacco: Never  Vaping Use   Vaping Use: Never used  Substance and Sexual Activity   Alcohol use: Not Currently    Alcohol/week: 1.0 standard drink of alcohol    Types: 1 Standard drinks or equivalent per week    Comment: occasional, maybe once a week   Drug use: No   Sexual activity: Yes    Partners: Female  Other Topics Concern   Not on file  Social History Narrative   Wears selt belt    Safe in relationship    Recently moved from [facility] 2018/19 originally from [facility] dad and sister 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    Retired Occupational hygienist degree    Social Determinants of Health   Financial Resource Strain: [place]  (02/09/2021)   Overall Financial Resource Strain (CARDIA)    Difficulty of Paying Living Expenses: Not hard at all  Food Insecurity: No [place] (02/09/2021)   Hunger Vital Sign    Worried About Running Out of Food in the Last Year: Never true    [place] in the Last Year: Never true  Transportation Needs: No Transportation Needs (02/09/2021)   PRAPARE - Hydrologist (Medical): No     Lack of Transportation (Non-Medical): No  Physical Activity: Insufficiently Active (02/09/2021)   Exercise Vital Sign    Days of Exercise per Week: 3 days    Minutes of Exercise per Session: 30 min  Stress: No Stress Concern Present (02/09/2021)   [place]    Feeling of Stress : Not at all  Social Connections: Unknown (02/09/2021)   Social Connection and Isolation Panel [NHANES]    Frequency of Communication with Friends and Family: Not on file    Frequency of Social Gatherings with Friends and Family: Not on file    Attends Religious Services: Not on file    Active  Member of Clubs or Organizations: Not on file    Attends Archivist Meetings: Not on file    Marital Status: Married     Family History:  The patient's family history includes Atrial fibrillation in his sister; Cancer in his mother; Diabetes in his mother; Hypertension in his mother.  ROS:   ROS   EKGs/Labs/Other Studies Reviewed:    Studies reviewed were summarized above. The additional studies were reviewed today:  Lexiscan MPI 05/14/2021:   The study is normal. The study is low risk.   No ST deviation was noted.   LV perfusion is normal. There is no evidence of ischemia. There is no evidence of infarction.   Left ventricular function is normal. End diastolic cavity size is normal. End systolic cavity size is normal.   CT attenuation images show mild aortic calcifications and minimal coronary calcifications. __________   Elwyn Reach patch 03/2021: The patient was monitored for 13 days, 1 hour. The predominant rhythm was sinus with an average rate of 79 bpm (range 61-134 bpm in sinus). There were rare PACs and PVCs. 2 episodes of nonsustained ventricular tachycardia were observed, lasting up to 13 beats with a maximum rate of 164 bpm. 8 atrial runs occurred, lasting up to 15 beats with a maximum rate of 160 bpm. No sustained arrhythmia or  prolonged pause was observed. There were no patient triggered events.   Predominantly sinus rhythm with rare PACs and PVCs.  A few episodes of NSVT and PSVT also occurred, as detailed above. __________   2D echo 02/22/2021: 1. Left ventricular ejection fraction, by estimation, is 60 to 65%. The  left ventricle has normal function. The left ventricle has no regional  wall motion abnormalities. Left ventricular diastolic parameters are  consistent with Grade I diastolic  dysfunction (impaired relaxation). The average left ventricular global  longitudinal strain is -15.6 %. The global longitudinal strain is normal.   2. Right ventricular systolic function is normal. The right ventricular  size is normal. Tricuspid regurgitation signal is inadequate for assessing  PA pressure.   3. The mitral valve is normal in structure. Mild mitral valve  regurgitation. No evidence of mitral stenosis.   4. The aortic valve is normal in structure. Aortic valve regurgitation is  not visualized. No aortic stenosis is present.   5. The inferior vena cava is normal in size with greater than 50%  respiratory variability, suggesting right atrial pressure of 3 mmHg. __________   Carlton Adam MPI 01/20/2020: There was no ST segment deviation noted during stress. No T wave inversion was noted during stress. The study is normal. This is a low risk study. The left ventricular ejection fraction is normal (55-65%). CT attenuation images showed mild aortic calcifications and minimal coronary calcifications.   EKG:  EKG is ordered today.  The EKG ordered today demonstrates ***  Recent Labs: 04/04/2021: ALT 19; B Natriuretic Peptide 7.6; Hemoglobin 13.4; Platelets 160 04/06/2021: BUN 29; Creatinine, Ser 1.59; Potassium 4.2; Sodium 137  Recent Lipid Panel    Component Value Date/Time   CHOL 129 12/16/2020 1052   TRIG 97.0 12/16/2020 1052   HDL 40.90 12/16/2020 1052   CHOLHDL 3 12/16/2020 1052   VLDL 19.4 12/16/2020  1052   LDLCALC 69 12/16/2020 1052    PHYSICAL EXAM:    VS:  There were no vitals taken for this visit.  BMI: There is no height or weight on file to calculate BMI.  Physical Exam  Wt Readings from Last 3 Encounters:  09/28/21 164 lb (74.4 kg)  06/10/21 169 lb (76.7 kg)  06/10/21 169 lb (76.7 kg)     ASSESSMENT & PLAN:   History of syncope with near syncope:  Coronary artery calcification/aortic atherosclerosis/HLD: LDL 69.  PSVT/NSVT:  CKD stage III:  HTN: Blood pressure  OSA:   {Are you ordering a CV Procedure (e.g. stress test, cath, DCCV, TEE, etc)?   Press F2        :[id number]}     Disposition: F/u with Dr. Saunders Revel or an APP in ***.   Medication Adjustments/Labs and Tests Ordered: Current medicines are reviewed at length with the patient today.  Concerns regarding medicines are outlined above. Medication changes, Labs and Tests ordered today are summarized above and listed in the Patient Instructions accessible in Encounters.   Signed, Christell Faith, PA-C 12/11/2021 9:29 AM     [place] [address] [place], [place] [address] [phone]

## 2021-12-16 ENCOUNTER — Ambulatory Visit: Payer: Medicare Other | Admitting: Physician Assistant

## 2021-12-16 DIAGNOSIS — I2510 Atherosclerotic heart disease of native coronary artery without angina pectoris: Secondary | ICD-10-CM

## 2021-12-16 DIAGNOSIS — N183 Chronic kidney disease, stage 3 unspecified: Secondary | ICD-10-CM

## 2021-12-16 DIAGNOSIS — G4730 Sleep apnea, unspecified: Secondary | ICD-10-CM

## 2021-12-16 DIAGNOSIS — I4729 Other ventricular tachycardia: Secondary | ICD-10-CM

## 2021-12-16 DIAGNOSIS — I471 Supraventricular tachycardia, unspecified: Secondary | ICD-10-CM

## 2021-12-16 DIAGNOSIS — R55 Syncope and collapse: Secondary | ICD-10-CM

## 2021-12-16 DIAGNOSIS — E785 Hyperlipidemia, unspecified: Secondary | ICD-10-CM

## 2021-12-16 DIAGNOSIS — I10 Essential (primary) hypertension: Secondary | ICD-10-CM

## 2021-12-23 DIAGNOSIS — E1140 Type 2 diabetes mellitus with diabetic neuropathy, unspecified: Secondary | ICD-10-CM | POA: Diagnosis not present

## 2021-12-23 DIAGNOSIS — B351 Tinea unguium: Secondary | ICD-10-CM | POA: Diagnosis not present

## 2022-01-11 NOTE — Progress Notes (Unsigned)
Cardiology Office Note    Date:  01/11/2022   ID:  Andrew Gonzalez, DOB Jan 25, 1951, MRN [id number]  PCP:  McLean-Scocuzza, Nino Glow, MD  Cardiologist:  Nelva Bush, MD  Electrophysiologist:  None   Chief Complaint: Follow-up  History of Present Illness:   Andrew Gonzalez is a 71 y.o. male with history of coronary artery calcification, syncope, COVID in 10/2019, DM, CKD stage III, HTN, HLD, and recently diagnosed sleep apnea on CPAP who presents for follow up of near syncope and history of syncope.   Andrew Gonzalez was evaluated in 01/2020 after he was incidentally noted to have aortic atherosclerosis on CXR. At that time, he reported some lingering SOB following his Covid infection. Lexiscan MPI in 01/2020 without evidence of ischemia with an EF of 55-65% and was overall low risk. There was mild aortic atherosclerosis and minimal coronary artery calcification noted. Aortic ultrasound showed no evidence of abdominal aortic aneurysm.    He was seen in the office in 01/2021 and was undergoing evaluation of a bone lesion in the left humerus of uncertain significance.  Bone scan had demonstrated very subtle uptake, prompting PET/CT that was most consistent with an indolent/benign osseous lesion. His longstanding dyspnea was improving, though not resolved. Given this, he underwent echo in 02/2021 showed an EF of 60-65%, no RWMA, Gr1DD, normal RV systolic function and ventricular cavity size, mild MR, and an estimated right atrial pressure of 3 mmHg.     He was seen in the ED in 02/2021 with dizziness and syncope.  Work-up was unrevealing.   He was admitted to the hospital in 03/2021 with dizziness and near syncope.  It was noted he had recently been restarted on metformin with the addition of Jardiance the week prior.  There was also some concern for potential GI illness.  He indicated he sat down on the toilet to have a BM and became dizzy.  During this episode, he did not suffer a full LOC.  He was without  associated symptoms.  Upon arrival to the ED he was noted to be dehydrated with an acute on CKD with a serum creatinine 1.74 with a baseline of 1-1.2.  Orthostatic vitals were positive.  CT head and MRI brain or reassuring.  No significant arrhythmias, high-grade AV block, or prolonged pauses were noted on telemetry.  He had symptomatic improvement with IV fluids.  Subsequent outpatient cardiac monitoring demonstrated a predominant rhythm of sinus with an average rate of 79 bpm (range 61 to 134 bpm in sinus), 2 episodes of NSVT lasting up to 13 beats with a maximal rate of 164 bpm, 8 atrial runs lasting up to 15 beats with a maximum rate of 160 bpm, rare PACs and PVCs, no sustained arrhythmias or prolonged pauses.  No patient triggered events.   He was seen in follow up on 05/07/2021 and was without symptoms of angina or decompensation.  He was without further near syncopal or syncopal episodes.  Lexiscan MPI on 05/14/2021 showed no evidence of ischemia or infarction and was overall low risk.  CT attenuated images showed mild aortic atherosclerosis and minimal coronary artery calcification.   He was last seen in the office on 06/10/2021 and remained without symptoms of angina or decompensation.  He was without further dizziness, presyncope, or syncope.  He had been diagnosed with sleep apnea and was adjusting to his CPAP.  He was subsequently cleared to resume driving in 06/3974 as he had been at least 6 months without further syncope.  ***  Labs independently reviewed: 03/2021 - potassium 4.2, BUN 29, serum creatinine 1.59, albumin 4.3, AST/ALT normal, Hgb 13.4, PLT 160, A1c 8.2 12/2020 - TC 129, TG 97, HDL 40, LDL 69 12/2019 - TSH normal  Past Medical History:  Diagnosis Date   Allergy    Chicken pox    Colon polyps    COPD (chronic obstructive pulmonary disease) ([place])    COVID-19    11/06/19 did not have mab infusion   Diabetes mellitus without complication (HCC)    Hyperlipidemia    Hypertension     Neuropathy    feet   PTSD (post-traumatic stress disorder)    Sleep apnea     Past Surgical History:  Procedure Laterality Date   COLONOSCOPY WITH PROPOFOL N/A 03/15/2021   Procedure: COLONOSCOPY WITH PROPOFOL;  Surgeon: Lucilla Lame, MD;  Location: [facility];  Service: Endoscopy;  Laterality: N/A;  Diabetic   LASIK     2003   TONSILLECTOMY      Current Medications: No outpatient medications have been marked as taking for the 01/18/22 encounter (Appointment) with Rise Mu, PA-C.    Allergies:   Bactrim [sulfamethoxazole-trimethoprim] and Biofreeze [menthol (topical analgesic)]   Social History   Socioeconomic History   Marital status: Married    Spouse name: Not on file   Number of children: Not on file   Years of education: Not on file   Highest education level: Not on file  Occupational History   Not on file  Tobacco Use   Smoking status: Former    Packs/day: 0.50    Years: 20.00    Total pack years: 10.00    Types: Cigarettes    Quit date: 2007    Years since quitting: 16.9   Smokeless tobacco: Never  Vaping Use   Vaping Use: Never used  Substance and Sexual Activity   Alcohol use: Not Currently    Alcohol/week: 1.0 standard drink of alcohol    Types: 1 Standard drinks or equivalent per week    Comment: occasional, maybe once a week   Drug use: No   Sexual activity: Yes    Partners: Female  Other Topics Concern   Not on file  Social History Narrative   Wears selt belt    Safe in relationship    Recently moved from [facility] 2018/19 originally from [facility] dad and sister 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    Retired Occupational hygienist degree    Social Determinants of Health   Financial Resource Strain: [place]  (02/09/2021)   Overall Financial Resource Strain (CARDIA)    Difficulty of Paying Living Expenses: Not hard at all  Food Insecurity: No [place] (02/09/2021)   Hunger Vital Sign    Worried About Running Out of Food in the Last Year:  Never true    [place] in the Last Year: Never true  Transportation Needs: No Transportation Needs (02/09/2021)   PRAPARE - Hydrologist (Medical): No    Lack of Transportation (Non-Medical): No  Physical Activity: Insufficiently Active (02/09/2021)   Exercise Vital Sign    Days of Exercise per Week: 3 days    Minutes of Exercise per Session: 30 min  Stress: No Stress Concern Present (02/09/2021)   [place]    Feeling of Stress : Not at all  Social Connections: Unknown (02/09/2021)   Social Connection and Isolation Panel [NHANES]  Frequency of Communication with Friends and Family: Not on file    Frequency of Social Gatherings with Friends and Family: Not on file    Attends Religious Services: Not on file    Active Member of Clubs or Organizations: Not on file    Attends Archivist Meetings: Not on file    Marital Status: Married     Family History:  The patient's family history includes Atrial fibrillation in his sister; Cancer in his mother; Diabetes in his mother; Hypertension in his mother.  ROS:   12-point review of systems is negative unless otherwise noted in the HPI.   EKGs/Labs/Other Studies Reviewed:    Studies reviewed were summarized above. The additional studies were reviewed today:  Lexiscan MPI 05/14/2021:   The study is normal. The study is low risk.   No ST deviation was noted.   LV perfusion is normal. There is no evidence of ischemia. There is no evidence of infarction.   Left ventricular function is normal. End diastolic cavity size is normal. End systolic cavity size is normal.   CT attenuation images show mild aortic calcifications and minimal coronary calcifications. __________   Elwyn Reach patch 03/2021: The patient was monitored for 13 days, 1 hour. The predominant rhythm was sinus with an average rate of 79 bpm (range 61-134 bpm in sinus). There were  rare PACs and PVCs. 2 episodes of nonsustained ventricular tachycardia were observed, lasting up to 13 beats with a maximum rate of 164 bpm. 8 atrial runs occurred, lasting up to 15 beats with a maximum rate of 160 bpm. No sustained arrhythmia or prolonged pause was observed. There were no patient triggered events.   Predominantly sinus rhythm with rare PACs and PVCs.  A few episodes of NSVT and PSVT also occurred, as detailed above. __________   2D echo 02/22/2021: 1. Left ventricular ejection fraction, by estimation, is 60 to 65%. The  left ventricle has normal function. The left ventricle has no regional  wall motion abnormalities. Left ventricular diastolic parameters are  consistent with Grade I diastolic  dysfunction (impaired relaxation). The average left ventricular global  longitudinal strain is -15.6 %. The global longitudinal strain is normal.   2. Right ventricular systolic function is normal. The right ventricular  size is normal. Tricuspid regurgitation signal is inadequate for assessing  PA pressure.   3. The mitral valve is normal in structure. Mild mitral valve  regurgitation. No evidence of mitral stenosis.   4. The aortic valve is normal in structure. Aortic valve regurgitation is  not visualized. No aortic stenosis is present.   5. The inferior vena cava is normal in size with greater than 50%  respiratory variability, suggesting right atrial pressure of 3 mmHg. __________   Carlton Adam MPI 01/20/2020: There was no ST segment deviation noted during stress. No T wave inversion was noted during stress. The study is normal. This is a low risk study. The left ventricular ejection fraction is normal (55-65%). CT attenuation images showed mild aortic calcifications and minimal coronary calcifications.   EKG:  EKG is ordered today.  The EKG ordered today demonstrates ***  Recent Labs: 04/04/2021: ALT 19; B Natriuretic Peptide 7.6; Hemoglobin 13.4; Platelets  160 04/06/2021: BUN 29; Creatinine, Ser 1.59; Potassium 4.2; Sodium 137  Recent Lipid Panel    Component Value Date/Time   CHOL 129 12/16/2020 1052   TRIG 97.0 12/16/2020 1052   HDL 40.90 12/16/2020 1052   CHOLHDL 3 12/16/2020 1052   VLDL 19.4 12/16/2020  [place] 12/16/2020 1052    PHYSICAL EXAM:    VS:  There were no vitals taken for this visit.  BMI: There is no height or weight on file to calculate BMI.  Physical Exam  Wt Readings from Last 3 Encounters:  09/28/21 164 lb (74.4 kg)  06/10/21 169 lb (76.7 kg)  06/10/21 169 lb (76.7 kg)     ASSESSMENT & PLAN:   History of syncope with near syncope:  Coronary artery calcification/aortic atherosclerosis/HLD: LDL  Paroxysmal SVT/NSVT:  CKD stage III:  HTN: Blood pressure  OSA:   {Are you ordering a CV Procedure (e.g. stress test, cath, DCCV, TEE, etc)?   Press F2        :[id number]}     Disposition: F/u with Dr. Saunders Revel or an APP in ***.   Medication Adjustments/Labs and Tests Ordered: Current medicines are reviewed at length with the patient today.  Concerns regarding medicines are outlined above. Medication changes, Labs and Tests ordered today are summarized above and listed in the Patient Instructions accessible in Encounters.   Signed, Christell Faith, PA-C 01/11/2022 8:47 AM     [facility] [address] [place], [place] [address] [phone]

## 2022-01-18 ENCOUNTER — Encounter: Payer: Self-pay | Admitting: Physician Assistant

## 2022-01-18 ENCOUNTER — Other Ambulatory Visit
Admission: RE | Admit: 2022-01-18 | Discharge: 2022-01-18 | Disposition: A | Discharge status: Home / Self Care | Payer: Medicare Other | Attending: Physician Assistant | Admitting: Physician Assistant

## 2022-01-18 ENCOUNTER — Ambulatory Visit: Payer: Medicare Other | Attending: Physician Assistant | Admitting: Physician Assistant

## 2022-01-18 VITALS — BP 134/74 | HR 69 | Ht 69.0 in | Wt 171.0 lb

## 2022-01-18 DIAGNOSIS — I2584 Coronary atherosclerosis due to calcified coronary lesion: Secondary | ICD-10-CM

## 2022-01-18 DIAGNOSIS — E785 Hyperlipidemia, unspecified: Secondary | ICD-10-CM | POA: Diagnosis not present

## 2022-01-18 DIAGNOSIS — R55 Syncope and collapse: Secondary | ICD-10-CM | POA: Diagnosis not present

## 2022-01-18 DIAGNOSIS — N183 Chronic kidney disease, stage 3 unspecified: Secondary | ICD-10-CM

## 2022-01-18 DIAGNOSIS — I2510 Atherosclerotic heart disease of native coronary artery without angina pectoris: Secondary | ICD-10-CM

## 2022-01-18 DIAGNOSIS — I700 Atherosclerosis of aorta: Secondary | ICD-10-CM | POA: Diagnosis not present

## 2022-01-18 DIAGNOSIS — I471 Supraventricular tachycardia, unspecified: Secondary | ICD-10-CM | POA: Insufficient documentation

## 2022-01-18 DIAGNOSIS — I4729 Other ventricular tachycardia: Secondary | ICD-10-CM

## 2022-01-18 DIAGNOSIS — I10 Essential (primary) hypertension: Secondary | ICD-10-CM

## 2022-01-18 DIAGNOSIS — G4730 Sleep apnea, unspecified: Secondary | ICD-10-CM | POA: Diagnosis not present

## 2022-01-18 LAB — LIPID PANEL
Cholesterol: 139 mg/dL (ref 0–200)
HDL: 43 mg/dL (ref >40)
LDL Cholesterol: 86 mg/dL (ref 0–99)
Total CHOL/HDL Ratio: 3.2 RATIO
Triglycerides: 52 mg/dL (ref <150)
VLDL: 10 mg/dL (ref 0–40)

## 2022-01-18 LAB — COMPREHENSIVE METABOLIC PANEL
ALT: 18 U/L (ref 0–44)
AST: 21 U/L (ref 15–41)
Albumin: 4.1 g/dL (ref 3.5–5.0)
Alkaline Phosphatase: 66 U/L (ref 38–126)
Anion gap: 7 (ref 5–15)
BUN: 41 mg/dL — ABNORMAL HIGH (ref 8–23)
CO2: 25 mmol/L (ref 22–32)
Calcium: 9 mg/dL (ref 8.9–10.3)
Chloride: 107 mmol/L (ref 98–111)
Creatinine, Ser: 1.84 mg/dL — ABNORMAL HIGH (ref 0.61–1.24)
GFR, Estimated: 39 mL/min — ABNORMAL LOW (ref >60)
Glucose, Bld: 174 mg/dL — ABNORMAL HIGH (ref 70–99)
Potassium: 4.8 mmol/L (ref 3.5–5.1)
Sodium: 139 mmol/L (ref 135–145)
Total Bilirubin: 0.7 mg/dL (ref 0.3–1.2)
Total Protein: 7.3 g/dL (ref 6.5–8.1)

## 2022-01-18 NOTE — Patient Instructions (Signed)
Medication Instructions:  No changes at this time.   *If you need a refill on your cardiac medications before your next appointment, please call your pharmacy*   Lab Work: CMET & Lipid panel today over at the [facility] medical mall entrance. Stop at registration desk to check in.   If you have labs (blood work) drawn today and your tests are completely normal, you will receive your results only by: [place] (if you have MyChart) OR A paper copy in the mail If you have any lab test that is abnormal or we need to change your treatment, we will call you to review the results.   Testing/Procedures: None   Follow-Up: At [facility], you and your health needs are our priority.  As part of our continuing mission to provide you with exceptional heart care, we have created designated Provider Care Teams.  These Care Teams include your primary Cardiologist (physician) and Advanced Practice Providers (APPs -  Physician Assistants and Nurse Practitioners) who all work together to provide you with the care you need, when you need it.   Your next appointment:   1 year(s)  The format for your next appointment:   In Person  Provider:   Nelva Bush, MD or Christell Faith, PA-C         Important Information About Sugar

## 2022-01-20 ENCOUNTER — Telehealth: Payer: Self-pay | Admitting: *Deleted

## 2022-01-20 NOTE — Telephone Encounter (Signed)
-----   Message from Rise Mu, PA-C sent at 01/18/2022  9:35 AM EST ----- Potassium at goal Random glucose elevated with known diabetes Renal function slightly worse than prior, which he is already aware of and has follow-up with the [facility] Liver function normal Total cholesterol, triglycerides, and HDL at goal LDL slightly above goal  Recommendations: -Not currently on cardiac medications that would affect renal function, though may need to discontinue Jardiance.  Would recommend he discuss this with the prescribing provider -Follow-up with the Levan for ongoing evaluation of his renal dysfunction -With LDL being above goal, if he is agreeable, would increase atorvastatin to 40 mg daily

## 2022-01-20 NOTE — Telephone Encounter (Signed)
Patient returning call.

## 2022-01-20 NOTE — Telephone Encounter (Signed)
Left voicemail message to call back for review of results and recommendations.  

## 2022-01-24 NOTE — Telephone Encounter (Signed)
Left voicemail message to call back for review of results.  

## 2022-01-25 NOTE — Telephone Encounter (Signed)
Pt made aware of lab results along with PA's recommendations. Pt stated he has an upcoming appointment with the [facility] in a few weeks, and would like to discuss with pcp prior to making any changes.

## 2022-01-25 NOTE — Telephone Encounter (Signed)
Follow Up:      Patient called and said he was retuning Pam's call.

## 2022-03-21 DIAGNOSIS — N1831 Chronic kidney disease, stage 3a: Secondary | ICD-10-CM | POA: Diagnosis not present

## 2022-03-21 DIAGNOSIS — Z03818 Encounter for observation for suspected exposure to other biological agents ruled out: Secondary | ICD-10-CM | POA: Diagnosis not present

## 2022-03-21 DIAGNOSIS — J019 Acute sinusitis, unspecified: Secondary | ICD-10-CM | POA: Diagnosis not present

## 2022-03-21 DIAGNOSIS — U071 COVID-19: Secondary | ICD-10-CM | POA: Diagnosis not present

## 2022-03-31 DIAGNOSIS — D2262 Melanocytic nevi of left upper limb, including shoulder: Secondary | ICD-10-CM | POA: Diagnosis not present

## 2022-03-31 DIAGNOSIS — D2271 Melanocytic nevi of right lower limb, including hip: Secondary | ICD-10-CM | POA: Diagnosis not present

## 2022-03-31 DIAGNOSIS — L821 Other seborrheic keratosis: Secondary | ICD-10-CM | POA: Diagnosis not present

## 2022-03-31 DIAGNOSIS — D2272 Melanocytic nevi of left lower limb, including hip: Secondary | ICD-10-CM | POA: Diagnosis not present

## 2022-03-31 DIAGNOSIS — D225 Melanocytic nevi of trunk: Secondary | ICD-10-CM | POA: Diagnosis not present

## 2022-03-31 DIAGNOSIS — D0361 Melanoma in situ of right upper limb, including shoulder: Secondary | ICD-10-CM | POA: Diagnosis not present

## 2022-03-31 DIAGNOSIS — D485 Neoplasm of uncertain behavior of skin: Secondary | ICD-10-CM | POA: Diagnosis not present

## 2022-03-31 DIAGNOSIS — D2261 Melanocytic nevi of right upper limb, including shoulder: Secondary | ICD-10-CM | POA: Diagnosis not present

## 2022-04-12 DIAGNOSIS — B351 Tinea unguium: Secondary | ICD-10-CM | POA: Diagnosis not present

## 2022-04-12 DIAGNOSIS — E1140 Type 2 diabetes mellitus with diabetic neuropathy, unspecified: Secondary | ICD-10-CM | POA: Diagnosis not present

## 2022-05-19 DIAGNOSIS — D0361 Melanoma in situ of right upper limb, including shoulder: Secondary | ICD-10-CM | POA: Diagnosis not present

## 2022-07-14 DIAGNOSIS — E1140 Type 2 diabetes mellitus with diabetic neuropathy, unspecified: Secondary | ICD-10-CM | POA: Diagnosis not present

## 2022-07-14 DIAGNOSIS — B351 Tinea unguium: Secondary | ICD-10-CM | POA: Diagnosis not present

## 2022-10-20 DIAGNOSIS — B351 Tinea unguium: Secondary | ICD-10-CM | POA: Diagnosis not present

## 2022-10-20 DIAGNOSIS — L851 Acquired keratosis [keratoderma] palmaris et plantaris: Secondary | ICD-10-CM | POA: Diagnosis not present

## 2022-10-20 DIAGNOSIS — E1140 Type 2 diabetes mellitus with diabetic neuropathy, unspecified: Secondary | ICD-10-CM | POA: Diagnosis not present

## 2022-12-07 DIAGNOSIS — Z85820 Personal history of malignant melanoma of skin: Secondary | ICD-10-CM | POA: Diagnosis not present

## 2022-12-07 DIAGNOSIS — L853 Xerosis cutis: Secondary | ICD-10-CM | POA: Diagnosis not present

## 2022-12-07 DIAGNOSIS — L821 Other seborrheic keratosis: Secondary | ICD-10-CM | POA: Diagnosis not present

## 2022-12-07 DIAGNOSIS — D2271 Melanocytic nevi of right lower limb, including hip: Secondary | ICD-10-CM | POA: Diagnosis not present

## 2022-12-07 DIAGNOSIS — D2262 Melanocytic nevi of left upper limb, including shoulder: Secondary | ICD-10-CM | POA: Diagnosis not present

## 2022-12-07 DIAGNOSIS — Z08 Encounter for follow-up examination after completed treatment for malignant neoplasm: Secondary | ICD-10-CM | POA: Diagnosis not present

## 2022-12-07 DIAGNOSIS — D2272 Melanocytic nevi of left lower limb, including hip: Secondary | ICD-10-CM | POA: Diagnosis not present

## 2022-12-07 DIAGNOSIS — D2261 Melanocytic nevi of right upper limb, including shoulder: Secondary | ICD-10-CM | POA: Diagnosis not present

## 2022-12-07 DIAGNOSIS — D225 Melanocytic nevi of trunk: Secondary | ICD-10-CM | POA: Diagnosis not present

## 2023-01-23 DIAGNOSIS — B351 Tinea unguium: Secondary | ICD-10-CM | POA: Diagnosis not present

## 2023-01-23 DIAGNOSIS — E1140 Type 2 diabetes mellitus with diabetic neuropathy, unspecified: Secondary | ICD-10-CM | POA: Diagnosis not present

## 2023-01-23 DIAGNOSIS — L851 Acquired keratosis [keratoderma] palmaris et plantaris: Secondary | ICD-10-CM | POA: Diagnosis not present

## 2023-03-20 ENCOUNTER — Ambulatory Visit: Payer: Medicare Other | Admitting: Cardiology

## 2023-04-03 NOTE — Progress Notes (Unsigned)
 Cardiology Office Note    Date:  04/04/2023   ID:  Andrew Gonzalez, DOB 03/31/1950, MRN [id number]  PCP:  [facility]  Cardiologist:  Yvonne Kendall, MD  Electrophysiologist:  None   Chief Complaint: Follow-up  History of Present Illness:   Andrew Gonzalez is a 73 y.o. male with history of coronary artery calcification, syncope, DM, CKD stage III, HTN, HLD, and sleep apnea on CPAP who presents for follow up of near syncope and history of syncope.   Andrew Gonzalez was evaluated in 01/2020 after he was incidentally noted to have aortic atherosclerosis on CXR. At that time, he reported some lingering SOB following his Covid infection. Lexiscan MPI in 01/2020 without evidence of ischemia with an EF of 55-65% and was overall low risk. There was mild aortic atherosclerosis and minimal coronary artery calcification noted. Aortic ultrasound showed no evidence of abdominal aortic aneurysm.    He was seen in the office in 01/2021 and was undergoing evaluation of a bone lesion in the left humerus of uncertain significance.  Bone scan had demonstrated very subtle uptake, prompting PET/CT that was most consistent with an indolent/benign osseous lesion. His longstanding dyspnea was improving, though not resolved. Given this, he underwent echo in 02/2021 showed an EF of 60-65%, no RWMA, Gr1DD, normal RV systolic function and ventricular cavity size, mild MR, and an estimated right atrial pressure of 3 mmHg.     He was seen in the ED in 02/2021 with dizziness and syncope.  Work-up was unrevealing.   He was admitted to the hospital in 03/2021 with dizziness and near syncope.  It was noted he had recently been restarted on metformin with the addition of Jardiance the week prior.  There was also some concern for potential GI illness.  He indicated he sat down on the toilet to have a BM and became dizzy.  During this episode, he did not suffer a full LOC.  He was without associated symptoms.  Upon arrival to the  ED he was noted to be dehydrated with an acute on CKD with a serum creatinine 1.74 with a baseline of 1-1.2.  Orthostatic vitals were positive.  CT head and MRI brain reassuring.  No significant arrhythmias, high-grade AV block, or prolonged pauses were noted on telemetry.  He had symptomatic improvement with IV fluids.  Subsequent outpatient cardiac monitoring demonstrated a predominant rhythm of sinus with an average rate of 79 bpm (range 61 to 134 bpm in sinus), 2 episodes of NSVT lasting up to 13 beats with a maximal rate of 164 bpm, 8 atrial runs lasting up to 15 beats with a maximum rate of 160 bpm, rare PACs and PVCs, no sustained arrhythmias or prolonged pauses.  No patient triggered events.  Lexiscan MPI on 05/14/2021 showed no evidence of ischemia or infarction and was overall low risk.  CT attenuated images showed mild aortic atherosclerosis and minimal coronary artery calcification.  In follow-up visits he has been without further episodes of near syncope or syncope.  He comes in doing well from a cardiac perspective and is without symptoms of angina or cardiac decompensation.  No palpitations, dizziness, presyncope, or syncope.  No lower extremity swelling or progressive orthopnea.  No falls or symptoms concerning for bleeding.  [facility].  He does not have any acute cardiac concerns at this time.   Labs independently reviewed: 01/2022 - potassium 4.8, BUN 41, serum creatinine 1.84, albumin 4.1, AST/ALT normal, TC  139, TG 52, HDL 43, LDL 86 03/2021 - Hgb 13.4, PLT 160, A1c 8.2 12/2019 - TSH normal   Past Medical History:  Diagnosis Date   Allergy    Chicken pox    Colon polyps    COPD (chronic obstructive pulmonary disease) (HCC)    COVID-19    11/06/19 did not have mab infusion   Diabetes mellitus without complication (HCC)    Hyperlipidemia    Hypertension    Neuropathy    feet   PTSD (post-traumatic stress disorder)    Sleep apnea      Past Surgical History:  Procedure Laterality Date   COLONOSCOPY WITH PROPOFOL N/A 03/15/2021   Procedure: COLONOSCOPY WITH PROPOFOL;  Surgeon: Midge Minium, MD;  Location: [facility];  Service: Endoscopy;  Laterality: N/A;  Diabetic   LASIK     2003   TONSILLECTOMY      Current Medications: Current Meds  Medication Sig   Albuterol Sulfate (PROAIR RESPICLICK) 108 (90 Base) MCG/ACT AEPB Inhale into the lungs as needed.   amLODipine (NORVASC) 2.5 MG tablet Take 1 tablet (2.5 mg total) by mouth daily.   atorvastatin (LIPITOR) 20 MG tablet Take 1 tablet (20 mg total) by mouth daily at 6 PM.   cetirizine (ZYRTEC) 10 MG tablet Take 1 tablet (10 mg total) by mouth daily.   cholecalciferol (VITAMIN D3) 25 MCG (1000 UNIT) tablet Take 1,000 Units by mouth daily.   Cinnamon 500 MG TABS Take 1 tablet by mouth daily.   cyclobenzaprine (FLEXERIL) 5 MG tablet cyclobenzaprine 5 mg tablet  Take ONE tab PO BID/TID PRN   empagliflozin (JARDIANCE) 25 MG TABS tablet Take 25 mg by mouth daily.   fluticasone (FLONASE) 50 MCG/ACT nasal spray Place 2 sprays into both nostrils daily.   glucose blood (FREESTYLE LITE) test strip Use as instructed qd to bid E11.9   Lancets (FREESTYLE) lancets Qd to bid Use as instructed E11.9   Multiple Vitamin (MULTIVITAMIN) tablet Take 1 tablet by mouth daily.   mupirocin ointment (BACTROBAN) 2 % Apply 1 application topically 2 (two) times daily. To scalp wound for 1 week   pioglitazone (ACTOS) 30 MG tablet Take 30 mg by mouth daily.   sodium chloride (OCEAN) 0.65 % SOLN nasal spray Place 1 spray into both nostrils as needed for congestion.   tamsulosin (FLOMAX) 0.4 MG CAPS capsule Take 0.4 mg by mouth daily after breakfast.    Allergies:   Bactrim [sulfamethoxazole-trimethoprim] and Biofreeze [menthol (topical analgesic)]   Social History   Socioeconomic History   Marital status: Married    Spouse name: Not on file   Number of children: Not on file   Years  of education: Not on file   Highest education level: Not on file  Occupational History   Not on file  Tobacco Use   Smoking status: Former    Current packs/day: 0.00    Average packs/day: 0.5 packs/day for 20.0 years (10.0 ttl pk-yrs)    Types: Cigarettes    Start date: 8    Quit date: 2007    Years since quitting: 18.1   Smokeless tobacco: Never  Vaping Use   Vaping status: Never Used  Substance and Sexual Activity   Alcohol use: Not Currently    Alcohol/week: 1.0 standard drink of alcohol    Types: 1 Standard drinks or equivalent per week    Comment: occasional, maybe once a week   Drug use: No   Sexual activity: Yes    Partners:  Female  Other Topics Concern   Not on file  Social History Narrative   Wears selt belt    Safe in relationship    Recently [place] 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    Retired Animal nutritionist degree    Social Drivers of Health   Financial Resource Strain: Low Risk  (02/09/2021)   Overall Financial Resource Strain (CARDIA)    Difficulty of Paying Living Expenses: Not hard at all  Food Insecurity: No Food Insecurity (02/09/2021)   Hunger Vital Sign    Worried About Running Out of Food in the Last Year: Never true    Ran Out of Food in the Last Year: Never true  Transportation Needs: No Transportation Needs (02/09/2021)   PRAPARE - Administrator, Civil Service (Medical): No    Lack of Transportation (Non-Medical): No  Physical Activity: Insufficiently Active (02/09/2021)   Exercise Vital Sign    Days of Exercise per Week: 3 days    Minutes of Exercise per Session: 30 min  Stress: No Stress Concern Present (02/09/2021)   [employer] of Occupational Health - Occupational Stress Questionnaire    Feeling of Stress : Not at all  Social Connections: Unknown (02/09/2021)   Social Connection and Isolation Panel [NHANES]    Frequency of Communication with Friends and Family: Not on file     Frequency of Social Gatherings with Friends and Family: Not on file    Attends Religious Services: Not on file    Active Member of Clubs or Organizations: Not on file    Attends Banker Meetings: Not on file    Marital Status: Married     Family History:  The patient's family history includes Atrial fibrillation in his sister; Cancer in his mother; Diabetes in his mother; Hypertension in his mother.  ROS:   12-point review of systems is negative unless otherwise noted in the HPI.   EKGs/Labs/Other Studies Reviewed:    Studies reviewed were summarized above. The additional studies were reviewed today:   Lexiscan MPI 05/14/2021:   The study is normal. The study is low risk.   No ST deviation was noted.   LV perfusion is normal. There is no evidence of ischemia. There is no evidence of infarction.   Left ventricular function is normal. End diastolic cavity size is normal. End systolic cavity size is normal.   CT attenuation images show mild aortic calcifications and minimal coronary calcifications. __________   Andrew Gonzalez patch 03/2021: The patient was monitored for 13 days, 1 hour. The predominant rhythm was sinus with an average rate of 79 bpm (range 61-134 bpm in sinus). There were rare PACs and PVCs. 2 episodes of nonsustained ventricular tachycardia were observed, lasting up to 13 beats with a maximum rate of 164 bpm. 8 atrial runs occurred, lasting up to 15 beats with a maximum rate of 160 bpm. No sustained arrhythmia or prolonged pause was observed. There were no patient triggered events.   Predominantly sinus rhythm with rare PACs and PVCs.  A few episodes of NSVT and PSVT also occurred, as detailed above. __________   2D echo 02/22/2021: 1. Left ventricular ejection fraction, by estimation, is 60 to 65%. The  left ventricle has normal function. The left ventricle has no regional  wall motion abnormalities. Left ventricular diastolic parameters are  consistent  with Grade I diastolic  dysfunction (impaired relaxation). The average left ventricular global  longitudinal strain  is -15.6 %. The global longitudinal strain is normal.   2. Right ventricular systolic function is normal. The right ventricular  size is normal. Tricuspid regurgitation signal is inadequate for assessing  PA pressure.   3. The mitral valve is normal in structure. Mild mitral valve  regurgitation. No evidence of mitral stenosis.   4. The aortic valve is normal in structure. Aortic valve regurgitation is  not visualized. No aortic stenosis is present.   5. The inferior vena cava is normal in size with greater than 50%  respiratory variability, suggesting right atrial pressure of 3 mmHg. __________   Eugenie Birks MPI 01/20/2020: There was no ST segment deviation noted during stress. No T wave inversion was noted during stress. The study is normal. This is a low risk study. The left ventricular ejection fraction is normal (55-65%). CT attenuation images showed mild aortic calcifications and minimal coronary calcifications.   EKG:  EKG is ordered today.  The EKG ordered today demonstrates NSR, 68 bpm, no acute ST-T changes  Recent Labs: No results found for requested labs within last 365 days.  Recent Lipid Panel    Component Value Date/Time   CHOL 139 01/18/2022 0844   TRIG 52 01/18/2022 0844   HDL 43 01/18/2022 0844   CHOLHDL 3.2 01/18/2022 0844   VLDL 10 01/18/2022 0844   LDLCALC 86 01/18/2022 0844    PHYSICAL EXAM:    VS:  BP 120/60 (BP Location: Left Arm, Patient Position: Sitting, Cuff Size: Normal)   Pulse 68   Ht 5\' 9"  (1.753 m)   Wt 182 lb (82.6 kg)   BMI 26.88 kg/m   BMI: Body mass index is 26.88 kg/m.  Physical Exam Vitals reviewed.  Constitutional:      Appearance: He is well-developed.  HENT:     Head: Normocephalic and atraumatic.  Eyes:     General:        Right eye: No discharge.        Left eye: No discharge.  Cardiovascular:     Rate  and Rhythm: Normal rate and regular rhythm.     Pulses:          Posterior tibial pulses are 2+ on the right side and 2+ on the left side.     Heart sounds: Normal heart sounds, S1 normal and S2 normal. Heart sounds not distant. No midsystolic click and no opening snap. No murmur heard.    No friction rub.  Pulmonary:     Effort: Pulmonary effort is normal. No respiratory distress.     Breath sounds: Normal breath sounds. No decreased breath sounds, wheezing, rhonchi or rales.  Chest:     Chest wall: No tenderness.  Musculoskeletal:     Cervical back: Normal range of motion.     Right lower leg: No edema.     Left lower leg: No edema.  Skin:    General: Skin is warm and dry.     Nails: There is no clubbing.  Neurological:     Mental Status: He is alert and oriented to person, place, and time.  Psychiatric:        Speech: Speech normal.        Behavior: Behavior normal.        Thought Content: Thought content normal.        Judgment: Judgment normal.     Wt Readings from Last 3 Encounters:  04/04/23 182 lb (82.6 kg)  01/18/22 171 lb (77.6 kg)  09/28/21 164 lb (  74.4 kg)     ASSESSMENT & PLAN:   History of syncope with near syncope: No further episodes.  Cardiac workup reassuring as outlined above.  Episodes appeared to be consistent with orthostasis in the setting of dehydration with GI volume loss and diabetic medications.  No indication for further cardiac testing at this time.  Coronary artery calcification/aortic atherosclerosis/HLD: No symptoms suggestive of angina or cardiac decompensation.  LDL followed by the [facility].  He will have updated labs and sent to our office.  Continue aggressive risk factor modification and primary prevention including amlodipine and atorvastatin.  Paroxysmal SVT/NSVT: Quiescent.  Not requiring AV nodal blocking medication.  Mitral regurgitation: Mild by echo in 2023.  Repeat echo in 1 to 2 years.  HTN: Blood pressure is  well-controlled in the office today.  Remains on amlodipine 2.5 mg daily.  CKD stage III: [facility]  He will have updated labs sent to our office.  OSA: CPAP.  Not discussed at today's visit.    Disposition: F/u with Dr. Okey Dupre or an APP in 12 months.   Medication Adjustments/Labs and Tests Ordered: Current medicines are reviewed at length with the patient today.  Concerns regarding medicines are outlined above. Medication changes, Labs and Tests ordered today are summarized above and listed in the Patient Instructions accessible in Encounters.   Signed, Eula Listen, PA-C 04/04/2023 5:34 PM     [facility] [place] [address] [place], [place] [address] [phone]

## 2023-04-04 ENCOUNTER — Ambulatory Visit: Payer: Medicare Other | Attending: Physician Assistant | Admitting: Physician Assistant

## 2023-04-04 ENCOUNTER — Encounter: Payer: Self-pay | Admitting: Physician Assistant

## 2023-04-04 VITALS — BP 120/60 | HR 68 | Ht 69.0 in | Wt 182.0 lb

## 2023-04-04 DIAGNOSIS — I2510 Atherosclerotic heart disease of native coronary artery without angina pectoris: Secondary | ICD-10-CM | POA: Insufficient documentation

## 2023-04-04 DIAGNOSIS — E785 Hyperlipidemia, unspecified: Secondary | ICD-10-CM | POA: Insufficient documentation

## 2023-04-04 DIAGNOSIS — N183 Chronic kidney disease, stage 3 unspecified: Secondary | ICD-10-CM | POA: Diagnosis present

## 2023-04-04 DIAGNOSIS — I10 Essential (primary) hypertension: Secondary | ICD-10-CM | POA: Insufficient documentation

## 2023-04-04 DIAGNOSIS — R55 Syncope and collapse: Secondary | ICD-10-CM | POA: Insufficient documentation

## 2023-04-04 DIAGNOSIS — I471 Supraventricular tachycardia, unspecified: Secondary | ICD-10-CM | POA: Insufficient documentation

## 2023-04-04 DIAGNOSIS — I4729 Other ventricular tachycardia: Secondary | ICD-10-CM | POA: Diagnosis present

## 2023-04-04 DIAGNOSIS — I700 Atherosclerosis of aorta: Secondary | ICD-10-CM | POA: Diagnosis present

## 2023-04-04 NOTE — Patient Instructions (Signed)
 Medication Instructions:  Your Physician recommend you continue on your current medication as directed.    *If you need a refill on your cardiac medications before your next appointment, please call your pharmacy*   Lab Work: None ordered at this time  If you have labs (blood work) drawn today and your tests are completely normal, you will receive your results only by: MyChart Message (if you have MyChart) OR A paper copy in the mail If you have any lab test that is abnormal or we need to change your treatment, we will call you to review the results.   Follow-Up: At [facility], you and your health needs are our priority.  As part of our continuing mission to provide you with exceptional heart care, we have created designated Provider Care Teams.  These Care Teams include your primary Cardiologist (physician) and Advanced Practice Providers (APPs -  Physician Assistants and Nurse Practitioners) who all work together to provide you with the care you need, when you need it.  Your next appointment:   12 month(s) call us in Oct/Nov for a February appt  Provider:   You may see Yvonne Kendall, MD or one of the following Advanced Practice Providers on your designated Care Team:   Eula Listen, [place]

## 2024-01-18 NOTE — Telephone Encounter (Signed)
 Will review at time of appointment.

## 2024-02-19 NOTE — Progress Notes (Signed)
 Subjective:  Pt. returns  For diabetic foot care. Some paresthesias in feet.  Objective:  Skin is dry and atrophic with bilateral edema.  Diminished hair growth.  All 10 toenails are thick, dystrophic and discolored, brittle with subungual debris.  Impression:  Diabetes with onychomycosis.   Plan:  Debridement of all 10 toenails in length and thickness sharply using toenail nippers.   Return in 3 months.

## 2024-02-21 ENCOUNTER — Emergency Department

## 2024-02-21 ENCOUNTER — Emergency Department
Admission: EM | Admit: 2024-02-21 | Discharge: 2024-02-21 | Disposition: A | Discharge status: Home / Self Care | Attending: Emergency Medicine | Admitting: Emergency Medicine

## 2024-02-21 ENCOUNTER — Encounter: Payer: Self-pay | Admitting: Emergency Medicine

## 2024-02-21 ENCOUNTER — Other Ambulatory Visit: Payer: Self-pay

## 2024-02-21 DIAGNOSIS — E1140 Type 2 diabetes mellitus with diabetic neuropathy, unspecified: Secondary | ICD-10-CM | POA: Insufficient documentation

## 2024-02-21 DIAGNOSIS — Z8616 Personal history of COVID-19: Secondary | ICD-10-CM | POA: Diagnosis not present

## 2024-02-21 DIAGNOSIS — R252 Cramp and spasm: Secondary | ICD-10-CM

## 2024-02-21 DIAGNOSIS — J449 Chronic obstructive pulmonary disease, unspecified: Secondary | ICD-10-CM | POA: Insufficient documentation

## 2024-02-21 DIAGNOSIS — I10 Essential (primary) hypertension: Secondary | ICD-10-CM | POA: Diagnosis not present

## 2024-02-21 LAB — COMPREHENSIVE METABOLIC PANEL WITH GFR
ALT: 31 U/L (ref 0–44)
AST: 33 U/L (ref 15–41)
Albumin: 4.3 g/dL (ref 3.5–5.0)
Alkaline Phosphatase: 73 U/L (ref 38–126)
Anion gap: 12 (ref 5–15)
BUN: 29 mg/dL — ABNORMAL HIGH (ref 8–23)
CO2: 22 mmol/L (ref 22–32)
Calcium: 9.2 mg/dL (ref 8.9–10.3)
Chloride: 106 mmol/L (ref 98–111)
Creatinine, Ser: 1.54 mg/dL — ABNORMAL HIGH (ref 0.61–1.24)
GFR, Estimated: 48 mL/min — ABNORMAL LOW
Glucose, Bld: 115 mg/dL — ABNORMAL HIGH (ref 70–99)
Potassium: 4.2 mmol/L (ref 3.5–5.1)
Sodium: 141 mmol/L (ref 135–145)
Total Bilirubin: 0.5 mg/dL (ref 0.0–1.2)
Total Protein: 6.7 g/dL (ref 6.5–8.1)

## 2024-02-21 LAB — CBC
HCT: 41.2 % (ref 39.0–52.0)
Hemoglobin: 13.7 g/dL (ref 13.0–17.0)
MCH: 30.7 pg (ref 26.0–34.0)
MCHC: 33.3 g/dL (ref 30.0–36.0)
MCV: 92.4 fL (ref 80.0–100.0)
Platelets: 203 K/uL (ref 150–400)
RBC: 4.46 MIL/uL (ref 4.22–5.81)
RDW: 13.1 % (ref 11.5–15.5)
WBC: 4.6 K/uL (ref 4.0–10.5)
nRBC: 0 % (ref 0.0–0.2)

## 2024-02-21 LAB — CK: Total CK: 104 U/L (ref 49–397)

## 2024-02-21 LAB — MAGNESIUM: Magnesium: 2.1 mg/dL (ref 1.7–2.4)

## 2024-02-21 MED ORDER — DIAZEPAM 5 MG PO TABS: 5.0000 mg | ORAL_TABLET | Freq: Two times a day (BID) | ORAL | 0 refills | Status: AC | PRN

## 2024-02-21 NOTE — ED Triage Notes (Addendum)
 Pt arrives via EMS from home for c/o bilat leg cramps x ; denies hx of same, denies pain at present

## 2024-02-21 NOTE — ED Notes (Signed)
Dr. Jacqualine Code at bedside for assessment

## 2024-02-21 NOTE — ED Provider Notes (Signed)
 "  [facility] Provider Note   Event Date/Time   First MD Initiated Contact with Patient 02/21/24 [phone]     (approximate)  History   Leg Pain   HPI  Andrew Gonzalez is a 74 y.o. male recently following with podiatry, reviewed note from Dr. Neill.  About 3 to 4 AM awoke with a bad cramp in his left calf.  He started to hyperventilate because of the pain per his wife and then started to feel cramping in his other leg as well.  They called EMS.  He reportedly was having severe tightness and cramping in the legs  He has been using Ozempic since May no other new changes.  He works to stay hydrated.  He has not had any unusual exertion or exercise.  No new numbness or weakness.  The muscles were cramped in the left leg and he could see the muscles twitching.  The symptoms have since gone away  He does have neuropathy in both feet unchanged.  No cold or blue foot  No other areas of pain or concern  Reviewed external records from cardiology as well from 2/25 coronary artery calcification, syncope, DM, CKD stage III, HTN, HLD,   Past Medical History:  Diagnosis Date   Allergy    Chicken pox    Colon polyps    COPD (chronic obstructive pulmonary disease) (HCC)    COVID-19    11/06/19 did not have mab infusion   Diabetes mellitus without complication (HCC)    Hyperlipidemia    Hypertension    Neuropathy    feet   PTSD (post-traumatic stress disorder)    Sleep apnea      Physical Exam   Triage Vital Signs: ED Triage Vitals  Encounter Vitals Group     BP 02/21/24 0511 121/64     Girls Systolic BP Percentile --      Girls Diastolic BP Percentile --      Boys Systolic BP Percentile --      Boys Diastolic BP Percentile --      Pulse Rate 02/21/24 0511 71     Resp 02/21/24 0511 20     Temp 02/21/24 0511 97.8 F (36.6 C)     Temp Source 02/21/24 0511 Oral     SpO2 02/21/24 0511 100 %     Weight 02/21/24 0512 159 lb (72.1 kg)     Height 02/21/24 0512 5' 9  (1.753 m)     Head Circumference --      Peak Flow --      Pain Score 02/21/24 0510 0     Pain Loc --      Pain Education --      Exclude from Growth Chart --     Most recent vital signs: Vitals:   02/21/24 0511 02/21/24 0912  BP: 121/64 (!) 122/59  Pulse: 71 87  Resp: 20 16  Temp: 97.8 F (36.6 C) 97.7 F (36.5 C)  SpO2: 100% 98%     General: Awake, no distress.  CV:   Good peripheral perfusion. Resp:   Normal effort. Lung sounds clear bilateral. Speaking without distress. Abd:   No distention. Soft, nontender Neuro:   No focal neuro deficits noted. Moves extremities well without noted concern.  He is able to demonstrate use of the lower legs flex extend ankles knees hips bilaterally without difficulty at this time.  No ongoing cramping or fasciculations of the muscles.  Reports of the left leg muscles to  feel slightly sore and achy but no further cramping or pain Other:  Strong palpable dorsalis pedis and posterior tibial pulses in both feet.  Warm and well-perfused lower extremities.   ED Results / Procedures / Treatments   Labs (all labs ordered are listed, but only abnormal results are displayed) Labs Reviewed  COMPREHENSIVE METABOLIC PANEL WITH GFR - Abnormal; Notable for the following components:      Result Value   Glucose, Bld 115 (*)    BUN 29 (*)    Creatinine, Ser 1.54 (*)    GFR, Estimated 48 (*)    All other components within normal limits  CBC  MAGNESIUM   CK   Labs reviewed, no notable abnormalities in electrolytes.  Creatinine at baseline compared to previous.  CBC normal  Magnesium  appropriate   US  Venous Img Lower Unilateral Left Result Date: 02/21/2024 CLINICAL DATA:  LEFT lower extremity pain EXAM: LEFT LOWER EXTREMITY VENOUS DOPPLER ULTRASOUND TECHNIQUE: Gray-scale sonography with compression, as well as color and duplex ultrasound, were performed to evaluate the deep venous system(s) from the level of the common femoral vein through the  popliteal and proximal calf veins. COMPARISON:  None available FINDINGS: VENOUS Normal compressibility of the common femoral, superficial femoral, and popliteal veins, as well as the visualized calf veins. Visualized portions of profunda femoral vein and great saphenous vein unremarkable. No filling defects to suggest DVT on grayscale or color Doppler imaging. Doppler waveforms show normal direction of venous flow, normal respiratory plasticity and response to augmentation. Limited views of the contralateral common femoral vein are unremarkable. OTHER None. Limitations: none IMPRESSION: No DVT of the left lower extremity. Electronically Signed   By: Aliene Lloyd M.D.   On: 02/21/2024 08:12       PROCEDURES:  Critical Care performed: No  Procedures   MEDICATIONS ORDERED IN ED: Medications - No data to display   IMPRESSION / MDM / ASSESSMENT AND PLAN / ED COURSE  I reviewed the triage vital signs and the nursing notes.                              Based on presentation, the differential diagnosis includes, but is not limited to key considerations:  Patient appears to have had a severe episode of cramping in the left calf.  It is now resolved.  I suspect that he started to experience some cramping in the right leg as well as a result of what he describes as severe pain and hyperventilating due to the pain.  There is now resolved.  Does have slight soreness in the left calf but no venous cords or congestion neurovascular motor exam reassuring.  Electrolytes appear appropriate  Patient's presentation is most consistent with acute complicated illness / injury requiring diagnostic workup.      Discussed safe use of Valium , no driving, potential sedative like effects with the patient and his wife.  Both in agreement to use only as prescribed and as needed if he has another episode of leg cramping  Return precautions and treatment recommendations and follow-up discussed with the patient who  is agreeable with the plan.   FINAL CLINICAL IMPRESSION(S) / ED DIAGNOSES   Final diagnoses:  Leg cramps     Rx / DC Orders   ED Discharge Orders          Ordered    diazepam  (VALIUM ) 5 MG tablet  Every 12 hours PRN  02/21/24 0743             Note:  This document was prepared using Dragon voice recognition software and may include unintentional dictation errors.   Dicky Anes, MD 02/21/24 [phone]  "

## 2024-02-21 NOTE — ED Notes (Signed)
 US  at bedside.

## 2024-02-21 NOTE — Discharge Instructions (Addendum)
 Please return to the ER right away if you have a cold or blue foot, you notice new numbness or weakness in the legs, severe pain, fever rash or other symptoms or concerns arise.  Please call your doctor at the [facility] today to schedule follow-up  Work to stay hydrated.  You may use Valium  as prescribed if you have another episode of cramps this may help relieve cramps but do note it usually takes about 20 or 30 minutes to start working.  You should not drive within 8 hours of this use or combine it with anything else that could be sedating.

## 2024-04-03 ENCOUNTER — Ambulatory Visit: Admitting: Physician Assistant
